# Patient Record
Sex: Female | Born: 1955 | Hispanic: Yes | Smoking: Former smoker
Health system: Southern US, Community
[De-identification: ages and names within clinical notes are randomized; demographics above are authoritative.]

## PROBLEM LIST (undated history)

## (undated) DIAGNOSIS — I208 Other forms of angina pectoris: Secondary | ICD-10-CM

## (undated) DIAGNOSIS — G43909 Migraine, unspecified, not intractable, without status migrainosus: Secondary | ICD-10-CM

## (undated) DIAGNOSIS — M2550 Pain in unspecified joint: Secondary | ICD-10-CM

## (undated) DIAGNOSIS — E785 Hyperlipidemia, unspecified: Secondary | ICD-10-CM

## (undated) DIAGNOSIS — K219 Gastro-esophageal reflux disease without esophagitis: Secondary | ICD-10-CM

## (undated) DIAGNOSIS — M1990 Unspecified osteoarthritis, unspecified site: Secondary | ICD-10-CM

## (undated) DIAGNOSIS — R0602 Shortness of breath: Secondary | ICD-10-CM

## (undated) DIAGNOSIS — I10 Essential (primary) hypertension: Secondary | ICD-10-CM

## (undated) DIAGNOSIS — M549 Dorsalgia, unspecified: Secondary | ICD-10-CM

## (undated) DIAGNOSIS — F32A Depression, unspecified: Secondary | ICD-10-CM

## (undated) DIAGNOSIS — E669 Obesity, unspecified: Secondary | ICD-10-CM

## (undated) DIAGNOSIS — R7303 Prediabetes: Secondary | ICD-10-CM

## (undated) DIAGNOSIS — F329 Major depressive disorder, single episode, unspecified: Secondary | ICD-10-CM

## (undated) DIAGNOSIS — D649 Anemia, unspecified: Secondary | ICD-10-CM

## (undated) DIAGNOSIS — J45909 Unspecified asthma, uncomplicated: Secondary | ICD-10-CM

## (undated) DIAGNOSIS — I2089 Other forms of angina pectoris: Secondary | ICD-10-CM

## (undated) DIAGNOSIS — Z8711 Personal history of peptic ulcer disease: Secondary | ICD-10-CM

## (undated) DIAGNOSIS — R600 Localized edema: Secondary | ICD-10-CM

## (undated) DIAGNOSIS — G4730 Sleep apnea, unspecified: Secondary | ICD-10-CM

## (undated) DIAGNOSIS — K829 Disease of gallbladder, unspecified: Secondary | ICD-10-CM

## (undated) DIAGNOSIS — F419 Anxiety disorder, unspecified: Secondary | ICD-10-CM

## (undated) DIAGNOSIS — R079 Chest pain, unspecified: Secondary | ICD-10-CM

## (undated) DIAGNOSIS — Z91018 Allergy to other foods: Secondary | ICD-10-CM

## (undated) DIAGNOSIS — K5900 Constipation, unspecified: Secondary | ICD-10-CM

## (undated) DIAGNOSIS — H409 Unspecified glaucoma: Secondary | ICD-10-CM

## (undated) DIAGNOSIS — E538 Deficiency of other specified B group vitamins: Secondary | ICD-10-CM

## (undated) DIAGNOSIS — R5383 Other fatigue: Secondary | ICD-10-CM

## (undated) HISTORY — DX: Sleep apnea, unspecified: G47.30

## (undated) HISTORY — DX: Unspecified osteoarthritis, unspecified site: M19.90

## (undated) HISTORY — DX: Shortness of breath: R06.02

## (undated) HISTORY — DX: Anemia, unspecified: D64.9

## (undated) HISTORY — PX: CHOLECYSTECTOMY: SHX55

## (undated) HISTORY — DX: Obesity, unspecified: E66.9

## (undated) HISTORY — DX: Deficiency of other specified B group vitamins: E53.8

## (undated) HISTORY — DX: Chest pain, unspecified: R07.9

## (undated) HISTORY — DX: Constipation, unspecified: K59.00

## (undated) HISTORY — DX: Hyperlipidemia, unspecified: E78.5

## (undated) HISTORY — DX: Disease of gallbladder, unspecified: K82.9

## (undated) HISTORY — DX: Allergy to other foods: Z91.018

## (undated) HISTORY — PX: APPENDECTOMY: SHX54

## (undated) HISTORY — DX: Other fatigue: R53.83

## (undated) HISTORY — PX: ABDOMINAL HYSTERECTOMY: SHX81

## (undated) HISTORY — DX: Dorsalgia, unspecified: M54.9

## (undated) HISTORY — DX: Anxiety disorder, unspecified: F41.9

## (undated) HISTORY — DX: Unspecified glaucoma: H40.9

## (undated) HISTORY — DX: Pain in unspecified joint: M25.50

## (undated) HISTORY — DX: Personal history of peptic ulcer disease: Z87.11

## (undated) HISTORY — DX: Localized edema: R60.0

---

## 1987-07-06 HISTORY — PX: GALLBLADDER SURGERY: SHX652

## 2000-10-15 ENCOUNTER — Encounter: Payer: Self-pay | Admitting: Emergency Medicine

## 2000-10-15 ENCOUNTER — Emergency Department (HOSPITAL_COMMUNITY): Admission: EM | Admit: 2000-10-15 | Discharge: 2000-10-15 | Payer: Self-pay | Admitting: *Deleted

## 2001-03-05 ENCOUNTER — Emergency Department (HOSPITAL_COMMUNITY): Admission: EM | Admit: 2001-03-05 | Discharge: 2001-03-05 | Payer: Self-pay | Admitting: *Deleted

## 2001-09-12 ENCOUNTER — Emergency Department (HOSPITAL_COMMUNITY): Admission: EM | Admit: 2001-09-12 | Discharge: 2001-09-12 | Payer: Self-pay | Admitting: Emergency Medicine

## 2001-09-12 ENCOUNTER — Encounter: Payer: Self-pay | Admitting: Emergency Medicine

## 2001-09-13 ENCOUNTER — Encounter: Payer: Self-pay | Admitting: Emergency Medicine

## 2001-09-13 ENCOUNTER — Ambulatory Visit (HOSPITAL_COMMUNITY): Admission: RE | Admit: 2001-09-13 | Discharge: 2001-09-13 | Payer: Self-pay | Admitting: Emergency Medicine

## 2002-08-28 ENCOUNTER — Emergency Department (HOSPITAL_COMMUNITY): Admission: EM | Admit: 2002-08-28 | Discharge: 2002-08-28 | Payer: Self-pay | Admitting: Emergency Medicine

## 2003-05-14 ENCOUNTER — Encounter: Admission: RE | Admit: 2003-05-14 | Discharge: 2003-05-14 | Payer: Self-pay | Admitting: Internal Medicine

## 2004-10-20 ENCOUNTER — Encounter: Admission: RE | Admit: 2004-10-20 | Discharge: 2005-01-18 | Payer: Self-pay | Admitting: Internal Medicine

## 2004-10-22 ENCOUNTER — Ambulatory Visit (HOSPITAL_COMMUNITY): Admission: RE | Admit: 2004-10-22 | Discharge: 2004-10-22 | Payer: Self-pay | Admitting: Internal Medicine

## 2006-04-07 ENCOUNTER — Emergency Department (HOSPITAL_COMMUNITY): Admission: EM | Admit: 2006-04-07 | Discharge: 2006-04-08 | Payer: Self-pay | Admitting: Emergency Medicine

## 2008-08-08 ENCOUNTER — Emergency Department (HOSPITAL_COMMUNITY): Admission: EM | Admit: 2008-08-08 | Discharge: 2008-08-08 | Payer: Self-pay | Admitting: Emergency Medicine

## 2010-07-26 ENCOUNTER — Encounter: Payer: Self-pay | Admitting: Internal Medicine

## 2010-10-20 LAB — POCT CARDIAC MARKERS
CKMB, poc: 1.4 ng/mL (ref 1.0–8.0)
CKMB, poc: 1.6 ng/mL (ref 1.0–8.0)
Troponin i, poc: <0.05 ng/mL (ref 0.00–0.09)

## 2010-10-20 LAB — COMPREHENSIVE METABOLIC PANEL
ALT: 15 U/L (ref 0–35)
Albumin: 3.9 g/dL (ref 3.5–5.2)
Alkaline Phosphatase: 57 U/L (ref 39–117)
CO2: 28 mEq/L (ref 19–32)
Chloride: 101 mEq/L (ref 96–112)
Creatinine, Ser: 0.82 mg/dL (ref 0.4–1.2)
GFR calc non Af Amer: >60 mL/min (ref >60)
Potassium: 3.3 mEq/L — ABNORMAL LOW (ref 3.5–5.1)
Sodium: 139 mEq/L (ref 135–145)
Total Protein: 7 g/dL (ref 6.0–8.3)

## 2010-10-20 LAB — DIFFERENTIAL
Basophils Absolute: 0 10*3/uL (ref 0.0–0.1)
Eosinophils Relative: 0 % (ref 0–5)
Lymphs Abs: 0.9 10*3/uL (ref 0.7–4.0)
Monocytes Absolute: 0.5 10*3/uL (ref 0.1–1.0)
Neutrophils Relative %: 80 % — ABNORMAL HIGH (ref 43–77)

## 2010-10-20 LAB — CBC
Hemoglobin: 13.8 g/dL (ref 12.0–15.0)
MCHC: 33.4 g/dL (ref 30.0–36.0)
MCV: 86.6 fL (ref 78.0–100.0)
Platelets: 221 10*3/uL (ref 150–400)
RBC: 4.79 MIL/uL (ref 3.87–5.11)

## 2012-07-08 ENCOUNTER — Emergency Department (HOSPITAL_COMMUNITY): Payer: No Typology Code available for payment source

## 2012-07-08 ENCOUNTER — Emergency Department (HOSPITAL_COMMUNITY)
Admission: EM | Admit: 2012-07-08 | Discharge: 2012-07-09 | Disposition: A | Discharge status: Home / Self Care | Payer: No Typology Code available for payment source | Attending: Emergency Medicine | Admitting: Emergency Medicine

## 2012-07-08 ENCOUNTER — Encounter (HOSPITAL_COMMUNITY): Payer: Self-pay

## 2012-07-08 DIAGNOSIS — Y9389 Activity, other specified: Secondary | ICD-10-CM | POA: Insufficient documentation

## 2012-07-08 DIAGNOSIS — Z90710 Acquired absence of both cervix and uterus: Secondary | ICD-10-CM | POA: Insufficient documentation

## 2012-07-08 DIAGNOSIS — Z8739 Personal history of other diseases of the musculoskeletal system and connective tissue: Secondary | ICD-10-CM | POA: Insufficient documentation

## 2012-07-08 DIAGNOSIS — S0993XA Unspecified injury of face, initial encounter: Secondary | ICD-10-CM | POA: Insufficient documentation

## 2012-07-08 DIAGNOSIS — Z79899 Other long term (current) drug therapy: Secondary | ICD-10-CM | POA: Insufficient documentation

## 2012-07-08 DIAGNOSIS — Z9089 Acquired absence of other organs: Secondary | ICD-10-CM | POA: Insufficient documentation

## 2012-07-08 DIAGNOSIS — R0789 Other chest pain: Secondary | ICD-10-CM

## 2012-07-08 DIAGNOSIS — I10 Essential (primary) hypertension: Secondary | ICD-10-CM | POA: Insufficient documentation

## 2012-07-08 DIAGNOSIS — Z9079 Acquired absence of other genital organ(s): Secondary | ICD-10-CM | POA: Insufficient documentation

## 2012-07-08 DIAGNOSIS — Z87891 Personal history of nicotine dependence: Secondary | ICD-10-CM | POA: Insufficient documentation

## 2012-07-08 DIAGNOSIS — Y92410 Unspecified street and highway as the place of occurrence of the external cause: Secondary | ICD-10-CM | POA: Insufficient documentation

## 2012-07-08 DIAGNOSIS — Z8679 Personal history of other diseases of the circulatory system: Secondary | ICD-10-CM | POA: Insufficient documentation

## 2012-07-08 DIAGNOSIS — S199XXA Unspecified injury of neck, initial encounter: Secondary | ICD-10-CM | POA: Insufficient documentation

## 2012-07-08 DIAGNOSIS — S298XXA Other specified injuries of thorax, initial encounter: Secondary | ICD-10-CM | POA: Insufficient documentation

## 2012-07-08 DIAGNOSIS — M7918 Myalgia, other site: Secondary | ICD-10-CM

## 2012-07-08 DIAGNOSIS — K219 Gastro-esophageal reflux disease without esophagitis: Secondary | ICD-10-CM | POA: Insufficient documentation

## 2012-07-08 DIAGNOSIS — J45909 Unspecified asthma, uncomplicated: Secondary | ICD-10-CM | POA: Insufficient documentation

## 2012-07-08 HISTORY — DX: Gastro-esophageal reflux disease without esophagitis: K21.9

## 2012-07-08 HISTORY — DX: Unspecified asthma, uncomplicated: J45.909

## 2012-07-08 HISTORY — DX: Unspecified osteoarthritis, unspecified site: M19.90

## 2012-07-08 HISTORY — DX: Other forms of angina pectoris: I20.8

## 2012-07-08 HISTORY — DX: Essential (primary) hypertension: I10

## 2012-07-08 HISTORY — DX: Other forms of angina pectoris: I20.89

## 2012-07-08 LAB — BASIC METABOLIC PANEL
CO2: 29 mEq/L (ref 19–32)
Creatinine, Ser: 0.65 mg/dL (ref 0.50–1.10)
Potassium: 3.5 mEq/L (ref 3.5–5.1)

## 2012-07-08 LAB — CBC
MCH: 27.7 pg (ref 26.0–34.0)
Platelets: 325 10*3/uL (ref 150–400)
RBC: 4.73 MIL/uL (ref 3.87–5.11)
RDW: 13.9 % (ref 11.5–15.5)
WBC: 7.2 10*3/uL (ref 4.0–10.5)

## 2012-07-08 MED ORDER — TRAMADOL HCL 50 MG PO TABS
100.0000 mg | ORAL_TABLET | Freq: Once | ORAL | Status: AC
  Administered 2012-07-08: 100 mg via ORAL
  Filled 2012-07-08: qty 2

## 2012-07-08 MED ORDER — IBUPROFEN 800 MG PO TABS
800.0000 mg | ORAL_TABLET | Freq: Once | ORAL | Status: AC
  Administered 2012-07-08: 800 mg via ORAL
  Filled 2012-07-08: qty 1

## 2012-07-08 MED ORDER — CYCLOBENZAPRINE HCL 10 MG PO TABS: 10.0000 mg | ORAL_TABLET | Freq: Three times a day (TID) | ORAL | Status: DC | PRN

## 2012-07-08 MED ORDER — TRAMADOL HCL 50 MG PO TABS: 100.0000 mg | ORAL_TABLET | Freq: Four times a day (QID) | ORAL | Status: DC | PRN

## 2012-07-08 MED ORDER — CYCLOBENZAPRINE HCL 10 MG PO TABS
10.0000 mg | ORAL_TABLET | Freq: Once | ORAL | Status: AC
  Administered 2012-07-08: 10 mg via ORAL
  Filled 2012-07-08: qty 1
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
## 2012-08-22 ENCOUNTER — Other Ambulatory Visit: Payer: Self-pay | Admitting: Internal Medicine

## 2012-08-22 DIAGNOSIS — Z1231 Encounter for screening mammogram for malignant neoplasm of breast: Secondary | ICD-10-CM

## 2012-08-24 ENCOUNTER — Ambulatory Visit
Admission: RE | Admit: 2012-08-24 | Discharge: 2012-08-24 | Disposition: A | Discharge status: Home / Self Care | Payer: 59 | Source: Ambulatory Visit | Attending: Internal Medicine | Admitting: Internal Medicine

## 2012-08-24 DIAGNOSIS — Z1231 Encounter for screening mammogram for malignant neoplasm of breast: Secondary | ICD-10-CM

## 2012-08-30 ENCOUNTER — Other Ambulatory Visit (HOSPITAL_COMMUNITY): Payer: Self-pay | Admitting: Chiropractic Medicine

## 2012-08-30 DIAGNOSIS — G8911 Acute pain due to trauma: Secondary | ICD-10-CM

## 2012-08-30 DIAGNOSIS — M549 Dorsalgia, unspecified: Secondary | ICD-10-CM

## 2012-08-30 DIAGNOSIS — M545 Low back pain: Secondary | ICD-10-CM

## 2012-09-05 ENCOUNTER — Ambulatory Visit (HOSPITAL_COMMUNITY)
Admission: RE | Admit: 2012-09-05 | Discharge: 2012-09-05 | Disposition: A | Discharge status: Home / Self Care | Payer: No Typology Code available for payment source | Source: Ambulatory Visit | Attending: Chiropractic Medicine | Admitting: Chiropractic Medicine

## 2012-09-05 DIAGNOSIS — M545 Low back pain, unspecified: Secondary | ICD-10-CM | POA: Insufficient documentation

## 2012-09-05 DIAGNOSIS — D1809 Hemangioma of other sites: Secondary | ICD-10-CM | POA: Insufficient documentation

## 2012-09-05 DIAGNOSIS — M5126 Other intervertebral disc displacement, lumbar region: Secondary | ICD-10-CM | POA: Insufficient documentation

## 2012-09-05 DIAGNOSIS — Q762 Congenital spondylolisthesis: Secondary | ICD-10-CM | POA: Insufficient documentation

## 2012-09-05 DIAGNOSIS — M129 Arthropathy, unspecified: Secondary | ICD-10-CM | POA: Insufficient documentation

## 2013-09-24 ENCOUNTER — Emergency Department (HOSPITAL_COMMUNITY)
Admission: EM | Admit: 2013-09-24 | Discharge: 2013-09-24 | Disposition: A | Discharge status: Home / Self Care | Payer: Medicaid Other | Attending: Emergency Medicine | Admitting: Emergency Medicine

## 2013-09-24 ENCOUNTER — Emergency Department (HOSPITAL_COMMUNITY): Payer: Medicaid Other

## 2013-09-24 ENCOUNTER — Encounter (HOSPITAL_COMMUNITY): Payer: Self-pay | Admitting: Emergency Medicine

## 2013-09-24 DIAGNOSIS — I209 Angina pectoris, unspecified: Secondary | ICD-10-CM | POA: Insufficient documentation

## 2013-09-24 DIAGNOSIS — M129 Arthropathy, unspecified: Secondary | ICD-10-CM | POA: Insufficient documentation

## 2013-09-24 DIAGNOSIS — X12XXXA Contact with other hot fluids, initial encounter: Secondary | ICD-10-CM | POA: Insufficient documentation

## 2013-09-24 DIAGNOSIS — K219 Gastro-esophageal reflux disease without esophagitis: Secondary | ICD-10-CM | POA: Insufficient documentation

## 2013-09-24 DIAGNOSIS — Y929 Unspecified place or not applicable: Secondary | ICD-10-CM | POA: Insufficient documentation

## 2013-09-24 DIAGNOSIS — T23109A Burn of first degree of unspecified hand, unspecified site, initial encounter: Secondary | ICD-10-CM | POA: Insufficient documentation

## 2013-09-24 DIAGNOSIS — Z791 Long term (current) use of non-steroidal anti-inflammatories (NSAID): Secondary | ICD-10-CM | POA: Insufficient documentation

## 2013-09-24 DIAGNOSIS — X131XXA Other contact with steam and other hot vapors, initial encounter: Secondary | ICD-10-CM

## 2013-09-24 DIAGNOSIS — T2010XA Burn of first degree of head, face, and neck, unspecified site, initial encounter: Secondary | ICD-10-CM | POA: Insufficient documentation

## 2013-09-24 DIAGNOSIS — J45909 Unspecified asthma, uncomplicated: Secondary | ICD-10-CM | POA: Insufficient documentation

## 2013-09-24 DIAGNOSIS — Z87891 Personal history of nicotine dependence: Secondary | ICD-10-CM | POA: Insufficient documentation

## 2013-09-24 DIAGNOSIS — T2000XA Burn of unspecified degree of head, face, and neck, unspecified site, initial encounter: Secondary | ICD-10-CM

## 2013-09-24 DIAGNOSIS — R059 Cough, unspecified: Secondary | ICD-10-CM | POA: Insufficient documentation

## 2013-09-24 DIAGNOSIS — R05 Cough: Secondary | ICD-10-CM | POA: Insufficient documentation

## 2013-09-24 DIAGNOSIS — Z79899 Other long term (current) drug therapy: Secondary | ICD-10-CM | POA: Insufficient documentation

## 2013-09-24 DIAGNOSIS — I10 Essential (primary) hypertension: Secondary | ICD-10-CM | POA: Insufficient documentation

## 2013-09-24 DIAGNOSIS — Y93G9 Activity, other involving cooking and grilling: Secondary | ICD-10-CM | POA: Insufficient documentation
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
## 2013-09-24 NOTE — ED Notes (Signed)
Tamala Julian, NP at bedside.

## 2013-09-24 NOTE — ED Notes (Signed)
Skin pink in color ,no blisters opserved on assessment.
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
## 2013-09-25 NOTE — ED Provider Notes (Signed)
Medical screening examination/treatment/procedure(s) were performed by non-physician practitioner and as supervising physician I was immediately available for consultation/collaboration.   EKG Interpretation None       Ezequiel Essex, MD 09/25/13 1014

## 2014-12-25 ENCOUNTER — Emergency Department (HOSPITAL_COMMUNITY)
Admission: EM | Admit: 2014-12-25 | Discharge: 2014-12-25 | Disposition: A | Discharge status: Nursing Home (Includes ICF) | Payer: Medicaid Other | Source: Home / Self Care | Attending: Family Medicine | Admitting: Family Medicine

## 2014-12-25 ENCOUNTER — Emergency Department (HOSPITAL_COMMUNITY): Payer: Medicaid Other

## 2014-12-25 ENCOUNTER — Emergency Department (HOSPITAL_BASED_OUTPATIENT_CLINIC_OR_DEPARTMENT_OTHER): Payer: Medicaid Other

## 2014-12-25 ENCOUNTER — Encounter (HOSPITAL_COMMUNITY): Payer: Self-pay | Admitting: Family Medicine

## 2014-12-25 ENCOUNTER — Emergency Department (HOSPITAL_COMMUNITY)
Admission: EM | Admit: 2014-12-25 | Discharge: 2014-12-25 | Disposition: A | Discharge status: Home / Self Care | Payer: Medicaid Other | Attending: Emergency Medicine | Admitting: Emergency Medicine

## 2014-12-25 ENCOUNTER — Encounter (HOSPITAL_COMMUNITY): Payer: Self-pay | Admitting: Emergency Medicine

## 2014-12-25 ENCOUNTER — Emergency Department (INDEPENDENT_AMBULATORY_CARE_PROVIDER_SITE_OTHER): Payer: Medicaid Other

## 2014-12-25 DIAGNOSIS — I209 Angina pectoris, unspecified: Secondary | ICD-10-CM | POA: Insufficient documentation

## 2014-12-25 DIAGNOSIS — J45901 Unspecified asthma with (acute) exacerbation: Secondary | ICD-10-CM | POA: Insufficient documentation

## 2014-12-25 DIAGNOSIS — Z79899 Other long term (current) drug therapy: Secondary | ICD-10-CM | POA: Insufficient documentation

## 2014-12-25 DIAGNOSIS — Z791 Long term (current) use of non-steroidal anti-inflammatories (NSAID): Secondary | ICD-10-CM | POA: Diagnosis not present

## 2014-12-25 DIAGNOSIS — K219 Gastro-esophageal reflux disease without esophagitis: Secondary | ICD-10-CM | POA: Diagnosis not present

## 2014-12-25 DIAGNOSIS — R0602 Shortness of breath: Secondary | ICD-10-CM

## 2014-12-25 DIAGNOSIS — I10 Essential (primary) hypertension: Secondary | ICD-10-CM | POA: Insufficient documentation

## 2014-12-25 DIAGNOSIS — M7989 Other specified soft tissue disorders: Secondary | ICD-10-CM | POA: Diagnosis not present

## 2014-12-25 DIAGNOSIS — R609 Edema, unspecified: Secondary | ICD-10-CM | POA: Diagnosis not present

## 2014-12-25 DIAGNOSIS — M1990 Unspecified osteoarthritis, unspecified site: Secondary | ICD-10-CM | POA: Diagnosis not present

## 2014-12-25 DIAGNOSIS — Z87891 Personal history of nicotine dependence: Secondary | ICD-10-CM | POA: Insufficient documentation

## 2014-12-25 DIAGNOSIS — R110 Nausea: Secondary | ICD-10-CM | POA: Insufficient documentation

## 2014-12-25 LAB — I-STAT TROPONIN, ED
TROPONIN I, POC: 0.01 ng/mL (ref 0.00–0.08)
Troponin i, poc: 0.02 ng/mL (ref 0.00–0.08)

## 2014-12-25 LAB — BASIC METABOLIC PANEL WITH GFR
Anion gap: 12 (ref 5–15)
BUN: 7 mg/dL (ref 6–20)
CO2: 26 mmol/L (ref 22–32)
Calcium: 9.8 mg/dL (ref 8.9–10.3)
Chloride: 103 mmol/L (ref 101–111)
Creatinine, Ser: 0.81 mg/dL (ref 0.44–1.00)
GFR calc Af Amer: >60 mL/min
GFR calc non Af Amer: >60 mL/min
Glucose, Bld: 93 mg/dL (ref 65–99)
Potassium: 3.7 mmol/L (ref 3.5–5.1)
Sodium: 141 mmol/L (ref 135–145)

## 2014-12-25 LAB — CBC
HCT: 43.2 % (ref 36.0–46.0)
HEMOGLOBIN: 13.8 g/dL (ref 12.0–15.0)
MCH: 26.6 pg (ref 26.0–34.0)
MCHC: 31.9 g/dL (ref 30.0–36.0)
MCV: 83.4 fL (ref 78.0–100.0)
Platelets: 306 10*3/uL (ref 150–400)
RBC: 5.18 MIL/uL — ABNORMAL HIGH (ref 3.87–5.11)
RDW: 14 % (ref 11.5–15.5)
WBC: 6.4 10*3/uL (ref 4.0–10.5)

## 2014-12-25 LAB — BRAIN NATRIURETIC PEPTIDE: B Natriuretic Peptide: 16.8 pg/mL (ref 0.0–100.0)

## 2014-12-25 LAB — D-DIMER, QUANTITATIVE: D-Dimer, Quant: 1.09 ug/mL-FEU — ABNORMAL HIGH (ref 0.00–0.48)

## 2014-12-25 MED ORDER — IPRATROPIUM-ALBUTEROL 0.5-2.5 (3) MG/3ML IN SOLN
3.0000 mL | Freq: Once | RESPIRATORY_TRACT | Status: AC
  Administered 2014-12-25: 3 mL via RESPIRATORY_TRACT

## 2014-12-25 MED ORDER — ASPIRIN 81 MG PO CHEW
324.0000 mg | CHEWABLE_TABLET | Freq: Once | ORAL | Status: AC
  Administered 2014-12-25: 324 mg via ORAL
  Filled 2014-12-25: qty 4

## 2014-12-25 MED ORDER — IPRATROPIUM-ALBUTEROL 0.5-2.5 (3) MG/3ML IN SOLN
3.0000 mL | Freq: Once | RESPIRATORY_TRACT | Status: AC
  Administered 2014-12-25: 3 mL via RESPIRATORY_TRACT
  Filled 2014-12-25: qty 3

## 2014-12-25 MED ORDER — SODIUM CHLORIDE 0.9 % IV SOLN: INTRAVENOUS | Status: DC

## 2014-12-25 MED ORDER — SODIUM CHLORIDE 0.9 % IV BOLUS (SEPSIS)
250.0000 mL | Freq: Once | INTRAVENOUS | Status: AC
  Administered 2014-12-25: 250 mL via INTRAVENOUS

## 2014-12-25 MED ORDER — IPRATROPIUM-ALBUTEROL 0.5-2.5 (3) MG/3ML IN SOLN
RESPIRATORY_TRACT | Status: AC
  Filled 2014-12-25: qty 3

## 2014-12-25 MED ORDER — TECHNETIUM TO 99M ALBUMIN AGGREGATED
6.0000 | Freq: Once | INTRAVENOUS | Status: AC | PRN
  Administered 2014-12-25: 6 via INTRAVENOUS

## 2014-12-25 MED ORDER — TECHNETIUM TC 99M DIETHYLENETRIAME-PENTAACETIC ACID: 40.0000 | Freq: Once | INTRAVENOUS | Status: DC | PRN

## 2014-12-25 MED ORDER — ONDANSETRON HCL 4 MG/2ML IJ SOLN
4.0000 mg | Freq: Once | INTRAMUSCULAR | Status: AC
  Administered 2014-12-25: 4 mg via INTRAVENOUS
  Filled 2014-12-25: qty 2

## 2014-12-25 MED ORDER — ALBUTEROL SULFATE HFA 108 (90 BASE) MCG/ACT IN AERS
2.0000 | INHALATION_SPRAY | Freq: Four times a day (QID) | RESPIRATORY_TRACT | Status: DC
  Administered 2014-12-25: 2 via RESPIRATORY_TRACT
  Filled 2014-12-25: qty 6.7

## 2014-12-25 NOTE — ED Notes (Signed)
Pt. Independently ambulatory to restroom. No distress, no SOB noted by pt.

## 2014-12-25 NOTE — Discharge Instructions (Signed)
Use the albuterol inhaler 2 puffs every 6 hours for next 7 days and then as needed. Follow up with your doctor. Work not provided.
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
## 2014-12-25 NOTE — ED Provider Notes (Signed)
Kristi [name] is a 59 y.o. female who presents to Urgent Care today for chest pain and shortness of breath with exertion. Patient notes bilateral lateral chest pain radiating to the central portion of her chest. The pain is moderate and worse with deep inspiration. She denies any exertional chest pain component. She does note worsening shortness of breath with exertion. This morning in the shower she felt somewhat dizzy and lightheaded but denies any palpitations. She notes mild leg swelling but denies one leg being bigger than the other or history of immobility. She does sleep propped up 25 at night however this is not new. She states that she sleeps propped up because her neck hurts her. She does have some wheezing as well. She is a former smoker.   Past Medical History  Diagnosis Date  . Hypertension   . Asthma   . Angina at rest   . Arthritis   . GERD (gastroesophageal reflux disease)    Past Surgical History  Procedure Laterality Date  . Cholecystectomy    . Appendectomy    . Abdominal hysterectomy     History  Substance Use Topics  . Smoking status: Former Research scientist (life sciences)  . Smokeless tobacco: Not on file  . Alcohol Use: No   ROS as above Medications: No current facility-administered medications for this encounter.   Current Outpatient Prescriptions  Medication Sig Dispense Refill  . carvedilol (COREG) 12.5 MG tablet Take 12.5 mg by mouth 2 (two) times daily with a meal.    . cyclobenzaprine (FLEXERIL) 10 MG tablet Take 10 mg by mouth 3 (three) times daily as needed for muscle spasms.     . diclofenac (VOLTAREN) 75 MG EC tablet Take 75 mg by mouth 2 (two) times daily.    Marland Kitchen losartan (COZAAR) 50 MG tablet Take 50 mg by mouth daily.    Marland Kitchen omeprazole (PRILOSEC) 20 MG capsule Take 20 mg by mouth daily.    . potassium chloride (KLOR-CON) 20 MEQ packet Take 20 mEq by mouth 2 (two) times daily.    Marland Kitchen acetaminophen (TYLENOL) 325 MG tablet Take 650 mg by mouth every 6 (six) hours as needed.     . ALPRAZolam (XANAX) 0.25 MG tablet Take 0.25 mg by mouth daily.    . montelukast (SINGULAIR) 10 MG tablet Take 10 mg by mouth at bedtime.    Marland Kitchen olmesartan (BENICAR) 40 MG tablet Take 40 mg by mouth daily.     Allergies  Allergen Reactions  . Iodine Anaphylaxis  . Sulfur Hives     Exam:  BP 146/95 mmHg  Pulse 94  Temp(Src) 98.3 F (36.8 C) (Oral)  Resp 20  SpO2 96% Gen: Well NAD obese HEENT: EOMI,  MMM no JVD Lungs: Normal work of breathing. Coarse breath sounds bilaterally. Heart: RRR no MRG Abd: NABS, Soft. Nondistended, Nontender Exts: Brisk capillary refill, warm and well perfused. No edema   ED ECG REPORT   Date: 12/25/2014  Rate: 90  Rhythm: normal sinus rhythm  QRS Axis: normal  Intervals: normal  ST/T Wave abnormalities: normal  Conduction Disutrbances:none  Narrative Interpretation:   Old EKG Reviewed: changes noted and Improved rate  I have personally reviewed the EKG tracing and agree with the computerized printout as noted.   Patient was given a 2.5/0.5 mg DuoNeb nebulizer treatment, and did not feel any better  No results found for this or any previous visit (from the past 24 hour(s)). Dg Chest 2 View  12/25/2014   CLINICAL DATA:  Chest pain,  shortness of breath. History of asthma, bronchitis, pneumonia.  EXAM: CHEST  2 VIEW  COMPARISON:  09/24/2013  FINDINGS: Heart is borderline in size. No confluent airspace opacities or effusions. No acute bony abnormality. Degenerative changes in the shoulders and thoracic spine.  IMPRESSION: No active cardiopulmonary disease.   Electronically Signed   By: Rolm Baptise M.D.   On: 12/25/2014 14:13    Assessment and Plan: 59 y.o. female with chest pain and shortness of breath with exertion. No improvement with nebulizer treatment. EKG not significantly abnormal. At this time the etiology of her complaint is unclear. Specifically she has some heart failure symptoms. Transfer to ED for further evaluation and  management.  Discussed warning signs or symptoms. Please see discharge instructions. Patient expresses understanding.     Gregor Hams, MD 12/25/14 [id number]

## 2014-12-25 NOTE — ED Notes (Signed)
Pt has been suffering from SOB, lower and mid upper chest pain, dizziness, headache, and numbness and tingling in BUE since yesterday.  Pt states she has been out of one of her BP medications for about a week, but has been taking her Coreg and Losartan.  Pt states she cannot walk very far without being extremely short of breath and dizzy.

## 2014-12-25 NOTE — Progress Notes (Signed)
VASCULAR LAB PRELIMINARY  PRELIMINARY  PRELIMINARY  PRELIMINARY  Bilateral lower extremity venous duplex  completed.    Preliminary report:  Bilateral:  No evidence of DVT, superficial thrombosis, or Baker's Cyst.   Clydene Burack, RVT 12/25/2014, 7:10 PM

## 2014-12-25 NOTE — ED Provider Notes (Signed)
CSN: [id number]     Arrival date & time 12/25/14  1449 History   First MD Initiated Contact with Patient 12/25/14 1506     Chief Complaint  Patient presents with  . Shortness of Breath  . Chest Pain     (Consider location/radiation/quality/duration/timing/severity/associated sxs/prior Treatment) Patient is a 59 y.o. female presenting with shortness of breath and chest pain. The history is provided by the patient.  Shortness of Breath Associated symptoms: chest pain, fever and wheezing   Associated symptoms: no abdominal pain, no headaches, no rash and no vomiting   Chest Pain Associated symptoms: fatigue, fever, nausea and shortness of breath   Associated symptoms: no abdominal pain, no back pain, no headache and not vomiting    patient sent from urgent care for concerns for shortness of breath and chest pain. Patient had a chest x-ray and EKG done at urgent care which was normal. Patient states that symptoms started yesterday at about 10 in the morning shortness of breath and chest pain exertional shortness of breath. Chest pains 8 out of 10 now bilateral chest, along the rib cage area. Associated with fevers nausea no vomiting no diarrhea also is associated with some leg swelling. Patient has a history of hypertension and asthma. Patient has felt as if she has been wheezing some. Patient used to have albuterol in the past but is currently out.  Past Medical History  Diagnosis Date  . Hypertension   . Asthma   . Angina at rest   . Arthritis   . GERD (gastroesophageal reflux disease)    Past Surgical History  Procedure Laterality Date  . Cholecystectomy    . Appendectomy    . Abdominal hysterectomy     History reviewed. No pertinent family history. History  Substance Use Topics  . Smoking status: Former Research scientist (life sciences)  . Smokeless tobacco: Not on file  . Alcohol Use: No   OB History    No data available     Review of Systems  Constitutional: Positive for fever and fatigue.   HENT: Negative for congestion.   Eyes: Negative for visual disturbance.  Respiratory: Positive for shortness of breath and wheezing.   Cardiovascular: Positive for chest pain and leg swelling.  Gastrointestinal: Positive for nausea. Negative for vomiting and abdominal pain.  Genitourinary: Negative for dysuria.  Musculoskeletal: Negative for back pain.  Skin: Negative for rash.  Neurological: Negative for headaches.  Hematological: Does not bruise/bleed easily.  Psychiatric/Behavioral: Negative for confusion.      Allergies  Iodine and Sulfur  Home Medications   Prior to Admission medications   Medication Sig Start Date End Date Taking? Authorizing Provider  acetaminophen (TYLENOL) 325 MG tablet Take 650 mg by mouth every 6 (six) hours as needed for mild pain.    Yes Historical Provider, MD  ALPRAZolam Duanne Moron) 0.25 MG tablet Take 0.25 mg by mouth at bedtime as needed.    Yes Historical Provider, MD  carvedilol (COREG) 12.5 MG tablet Take 12.5 mg by mouth 2 (two) times daily with a meal.   Yes Historical Provider, MD  cyclobenzaprine (FLEXERIL) 10 MG tablet Take 10 mg by mouth 2 (two) times daily as needed for muscle spasms.    Yes Historical Provider, MD  diclofenac (VOLTAREN) 75 MG EC tablet Take 75 mg by mouth 2 (two) times daily.   Yes Historical Provider, MD  losartan (COZAAR) 50 MG tablet Take 50 mg by mouth daily.   Yes Historical Provider, MD  montelukast (SINGULAIR) 10 MG tablet  Take 10 mg by mouth at bedtime.   Yes Historical Provider, MD  naproxen sodium (ANAPROX) 220 MG tablet Take 220 mg by mouth 2 (two) times daily as needed.   Yes Historical Provider, MD  omeprazole (PRILOSEC) 20 MG capsule Take 20 mg by mouth daily.   Yes Historical Provider, MD  potassium chloride (KLOR-CON) 20 MEQ packet Take 20 mEq by mouth 2 (two) times daily.   Yes Historical Provider, MD   BP 112/59 mmHg  Pulse 93  Resp 22  SpO2 97% Physical Exam  Constitutional: She is oriented to person,  place, and time. She appears well-developed and well-nourished. No distress.  HENT:  Head: Normocephalic and atraumatic.  Mouth/Throat: Oropharynx is clear and moist.  Eyes: Conjunctivae and EOM are normal. Pupils are equal, round, and reactive to light.  Neck: Normal range of motion.  Cardiovascular: Normal rate, regular rhythm and normal heart sounds.   No murmur heard. Pulmonary/Chest: Effort normal and breath sounds normal. No respiratory distress. She has no wheezes.  Abdominal: Soft. Bowel sounds are normal. There is no tenderness.  Musculoskeletal: Normal range of motion. She exhibits edema.  Neurological: She is alert and oriented to person, place, and time. No cranial nerve deficit. She exhibits normal muscle tone. Coordination normal.  Skin: Skin is warm. No rash noted.  Nursing note and vitals reviewed.   ED Course  Procedures (including critical care time) Labs Review Labs Reviewed  CBC - Abnormal; Notable for the following:    RBC 5.18 (*)    All other components within normal limits  D-DIMER, QUANTITATIVE (NOT AT [facility]) - Abnormal; Notable for the following:    D-Dimer, Quant 1.09 (*)    All other components within normal limits  BASIC METABOLIC PANEL  BRAIN NATRIURETIC PEPTIDE  I-STAT TROPOININ, ED  Randolm Idol, ED    Imaging Review Dg Chest 2 View  12/25/2014   CLINICAL DATA:  Chest pain, shortness of breath. History of asthma, bronchitis, pneumonia.  EXAM: CHEST  2 VIEW  COMPARISON:  09/24/2013  FINDINGS: Heart is borderline in size. No confluent airspace opacities or effusions. No acute bony abnormality. Degenerative changes in the shoulders and thoracic spine.  IMPRESSION: No active cardiopulmonary disease.   Electronically Signed   By: Rolm Baptise M.D.   On: 12/25/2014 14:13   Ct Chest Wo Contrast  12/25/2014   CLINICAL DATA:  Chest pressure and shortness of breath  EXAM: CT CHEST WITHOUT CONTRAST  TECHNIQUE: Multidetector CT imaging of the chest was  performed following the standard protocol without IV contrast. Patient has allergy to iodinated contrast material.  COMPARISON:  Chest radiograph December 25, 2014  FINDINGS: There is mild atelectatic change in the left base. There is no lung edema or consolidation. No pneumothorax.  There is no demonstrable thoracic adenopathy. There is atherosclerotic change in the aortic arch region. There is no demonstrable thoracic aortic aneurysm. The pericardium is not thickened. Visualized thyroid appears normal.  Visualized upper abdomen, there are surgical clips in the gallbladder fossa region.  There are no blastic or lytic bone lesions. There is degenerative change in the thoracic spine.  There is a subtle lucency in the manubrium which appears to represent a fracture. This subtle fracture is best seen on sagittal slice 63 series 502.  IMPRESSION: Apparent subtle nondisplaced fracture of the manubrium. Mild atelectatic change in the left base. No edema or consolidation. No adenopathy. Gallbladder absent.   Electronically Signed   By: Lowella Grip III M.D.  On: 12/25/2014 16:41   Nm Pulmonary Perfusion  12/25/2014   CLINICAL DATA:  Chest pain and shortness of breath  EXAM: NUCLEAR MEDICINE VENTILATION AND PERFUSION SCAN  Views: Anterior, posterior, LPO, LAO, RPO, RAO -ventilation and perfusion  Radionuclide: Technetium 57m DTPA -ventilation; Technetium 76m macroaggregated albumin-perfusion  Dose:  40.0 mCi-ventilation; 6.0 mCi-perfusion  Route of administration: Inhalation -ventilation ; intravenous -perfusion  COMPARISON:  Chest radiograph and chest CT December 25, 2014  FINDINGS: On the ventilation study, radiotracer uptake is homogeneous and symmetric bilaterally. There are no appreciable ventilation defects.  On the perfusion study, radiotracer uptake is homogeneous and symmetric bilaterally. There are no appreciable perfusion defects.  IMPRESSION: Normal ventilation and perfusion lung scans. Very low probability of  pulmonary embolus.   Electronically Signed   By: Lowella Grip III M.D.   On: 12/25/2014 21:42     EKG Interpretation None      MDM   Final diagnoses:  SOB (shortness of breath)  Reactive airway disease, unspecified asthma severity, with acute exacerbation    Patient with extensive workup for the chest pain and shortness of breath. Troponins were negative 2 record chest x-ray normal. CT chest without contrast because patient has an allergy did show a subtle non-displaced fracture of the manubrium which could perhaps represent the patient's symptoms however patient has been admitting to feeling like she's been wheezing on and off and has not been using albuterol inhaler. Given DuoNeb here. Had bilateral leg swelling d-dimer was elevated had ultrasounds of both legs and VQ scan all of which were negative for deep vein thrombosis or any evidence of pulmonary embolus. Patient had troponins 2 that were negative as stated above. This does not seem to be cardiac certainly not a pulmonary embolus no evidence of a pneumonia. Could be related to the subtle fracture on the manubrium. Also suspect though it's more likely that his reactive airway disease related to her asthma. Will treat with albuterol inhaler and have her follow-up with her doctor. Work note provided.    Fredia Sorrow, MD 12/25/14 2214

## 2016-05-10 ENCOUNTER — Emergency Department (HOSPITAL_COMMUNITY): Payer: Self-pay

## 2016-05-10 ENCOUNTER — Observation Stay (HOSPITAL_COMMUNITY)
Admission: EM | Admit: 2016-05-10 | Discharge: 2016-05-11 | Disposition: A | Discharge status: Home / Self Care | Payer: Self-pay | Attending: Internal Medicine | Admitting: Internal Medicine

## 2016-05-10 ENCOUNTER — Encounter (HOSPITAL_COMMUNITY): Payer: Self-pay | Admitting: Emergency Medicine

## 2016-05-10 DIAGNOSIS — Z9181 History of falling: Secondary | ICD-10-CM | POA: Insufficient documentation

## 2016-05-10 DIAGNOSIS — Z87891 Personal history of nicotine dependence: Secondary | ICD-10-CM | POA: Insufficient documentation

## 2016-05-10 DIAGNOSIS — E669 Obesity, unspecified: Secondary | ICD-10-CM | POA: Insufficient documentation

## 2016-05-10 DIAGNOSIS — K2100 Gastro-esophageal reflux disease with esophagitis, without bleeding: Secondary | ICD-10-CM | POA: Insufficient documentation

## 2016-05-10 DIAGNOSIS — R0789 Other chest pain: Secondary | ICD-10-CM

## 2016-05-10 DIAGNOSIS — Z791 Long term (current) use of non-steroidal anti-inflammatories (NSAID): Secondary | ICD-10-CM | POA: Insufficient documentation

## 2016-05-10 DIAGNOSIS — I161 Hypertensive emergency: Secondary | ICD-10-CM

## 2016-05-10 DIAGNOSIS — M25561 Pain in right knee: Secondary | ICD-10-CM | POA: Insufficient documentation

## 2016-05-10 DIAGNOSIS — R079 Chest pain, unspecified: Principal | ICD-10-CM | POA: Insufficient documentation

## 2016-05-10 DIAGNOSIS — I119 Hypertensive heart disease without heart failure: Secondary | ICD-10-CM | POA: Insufficient documentation

## 2016-05-10 DIAGNOSIS — M25562 Pain in left knee: Secondary | ICD-10-CM | POA: Insufficient documentation

## 2016-05-10 DIAGNOSIS — I10 Essential (primary) hypertension: Secondary | ICD-10-CM

## 2016-05-10 DIAGNOSIS — Z8249 Family history of ischemic heart disease and other diseases of the circulatory system: Secondary | ICD-10-CM | POA: Insufficient documentation

## 2016-05-10 DIAGNOSIS — Z6841 Body Mass Index (BMI) 40.0 and over, adult: Secondary | ICD-10-CM | POA: Insufficient documentation

## 2016-05-10 DIAGNOSIS — F419 Anxiety disorder, unspecified: Secondary | ICD-10-CM

## 2016-05-10 DIAGNOSIS — K219 Gastro-esophageal reflux disease without esophagitis: Secondary | ICD-10-CM | POA: Insufficient documentation

## 2016-05-10 DIAGNOSIS — M1990 Unspecified osteoarthritis, unspecified site: Secondary | ICD-10-CM | POA: Insufficient documentation

## 2016-05-10 LAB — CBC WITH DIFFERENTIAL/PLATELET
Basophils Absolute: 0 10*3/uL (ref 0.0–0.1)
Basophils Relative: 0 %
EOS PCT: 1 %
Eosinophils Absolute: 0.1 10*3/uL (ref 0.0–0.7)
HCT: 41.4 % (ref 36.0–46.0)
Hemoglobin: 13 g/dL (ref 12.0–15.0)
LYMPHS ABS: 2 10*3/uL (ref 0.7–4.0)
LYMPHS PCT: 34 %
MCH: 27.1 pg (ref 26.0–34.0)
MCHC: 31.4 g/dL (ref 30.0–36.0)
MCV: 86.4 fL (ref 78.0–100.0)
MONO ABS: 0.5 10*3/uL (ref 0.1–1.0)
Monocytes Relative: 9 %
Neutro Abs: 3.4 10*3/uL (ref 1.7–7.7)
Neutrophils Relative %: 56 %
PLATELETS: 258 10*3/uL (ref 150–400)
RBC: 4.79 MIL/uL (ref 3.87–5.11)
RDW: 14 % (ref 11.5–15.5)
WBC: 6 10*3/uL (ref 4.0–10.5)

## 2016-05-10 LAB — TROPONIN I
Troponin I: <0.03 ng/mL (ref <0.03)
Troponin I: <0.03 ng/mL (ref <0.03)

## 2016-05-10 LAB — COMPREHENSIVE METABOLIC PANEL
ALBUMIN: 3.7 g/dL (ref 3.5–5.0)
ALT: 12 U/L — AB (ref 14–54)
AST: 20 U/L (ref 15–41)
Alkaline Phosphatase: 51 U/L (ref 38–126)
Anion gap: 7 (ref 5–15)
BILIRUBIN TOTAL: 0.3 mg/dL (ref 0.3–1.2)
BUN: 12 mg/dL (ref 6–20)
CHLORIDE: 107 mmol/L (ref 101–111)
CO2: 30 mmol/L (ref 22–32)
CREATININE: 0.85 mg/dL (ref 0.44–1.00)
Calcium: 9.3 mg/dL (ref 8.9–10.3)
GFR calc Af Amer: >60 mL/min (ref >60)
GFR calc non Af Amer: >60 mL/min (ref >60)
GLUCOSE: 89 mg/dL (ref 65–99)
POTASSIUM: 4.2 mmol/L (ref 3.5–5.1)
Sodium: 144 mmol/L (ref 135–145)
TOTAL PROTEIN: 6.7 g/dL (ref 6.5–8.1)

## 2016-05-10 LAB — BRAIN NATRIURETIC PEPTIDE: B Natriuretic Peptide: 43 pg/mL (ref 0.0–100.0)

## 2016-05-10 LAB — I-STAT TROPONIN, ED: Troponin i, poc: 0 ng/mL (ref 0.00–0.08)

## 2016-05-10 MED ORDER — HYDRALAZINE HCL 20 MG/ML IJ SOLN: 5.0000 mg | INTRAMUSCULAR | Status: DC | PRN

## 2016-05-10 MED ORDER — SODIUM CHLORIDE 0.9% FLUSH: 3.0000 mL | Freq: Two times a day (BID) | INTRAVENOUS | Status: DC

## 2016-05-10 MED ORDER — HYDROCHLOROTHIAZIDE 25 MG PO TABS
25.0000 mg | ORAL_TABLET | Freq: Every day | ORAL | Status: DC
  Administered 2016-05-10 – 2016-05-11 (×2): 25 mg via ORAL
  Filled 2016-05-10 (×2): qty 1

## 2016-05-10 MED ORDER — MORPHINE SULFATE (PF) 4 MG/ML IV SOLN: 2.0000 mg | INTRAVENOUS | Status: DC | PRN

## 2016-05-10 MED ORDER — GI COCKTAIL ~~LOC~~
30.0000 mL | Freq: Three times a day (TID) | ORAL | Status: DC | PRN
  Administered 2016-05-10: 30 mL via ORAL
  Filled 2016-05-10: qty 30

## 2016-05-10 MED ORDER — NITROGLYCERIN 0.4 MG SL SUBL: 0.4000 mg | SUBLINGUAL_TABLET | SUBLINGUAL | Status: DC | PRN

## 2016-05-10 MED ORDER — KETOROLAC TROMETHAMINE 30 MG/ML IJ SOLN
30.0000 mg | Freq: Four times a day (QID) | INTRAMUSCULAR | Status: DC | PRN
  Administered 2016-05-11: 30 mg via INTRAVENOUS
  Filled 2016-05-10: qty 1

## 2016-05-10 MED ORDER — INFLUENZA VAC SPLIT QUAD 0.5 ML IM SUSY
0.5000 mL | PREFILLED_SYRINGE | INTRAMUSCULAR | Status: AC
  Administered 2016-05-11: 0.5 mL via INTRAMUSCULAR
  Filled 2016-05-10: qty 0.5

## 2016-05-10 MED ORDER — ONDANSETRON HCL 4 MG/2ML IJ SOLN: 4.0000 mg | Freq: Four times a day (QID) | INTRAMUSCULAR | Status: DC | PRN

## 2016-05-10 MED ORDER — ACETAMINOPHEN 325 MG PO TABS
650.0000 mg | ORAL_TABLET | ORAL | Status: DC | PRN
  Administered 2016-05-10 – 2016-05-11 (×2): 650 mg via ORAL
  Filled 2016-05-10 (×2): qty 2

## 2016-05-10 MED ORDER — SODIUM CHLORIDE 0.9% FLUSH: 3.0000 mL | INTRAVENOUS | Status: DC | PRN

## 2016-05-10 MED ORDER — IRBESARTAN 75 MG PO TABS
75.0000 mg | ORAL_TABLET | Freq: Every day | ORAL | Status: DC
  Administered 2016-05-10 – 2016-05-11 (×2): 75 mg via ORAL
  Filled 2016-05-10 (×2): qty 1

## 2016-05-10 MED ORDER — GI COCKTAIL ~~LOC~~
30.0000 mL | Freq: Four times a day (QID) | ORAL | Status: DC | PRN
  Administered 2016-05-11: 30 mL via ORAL
  Filled 2016-05-10: qty 30

## 2016-05-10 MED ORDER — ONDANSETRON HCL 4 MG PO TABS: 4.0000 mg | ORAL_TABLET | Freq: Four times a day (QID) | ORAL | Status: DC | PRN

## 2016-05-10 MED ORDER — ALPRAZOLAM 0.25 MG PO TABS: 0.2500 mg | ORAL_TABLET | Freq: Two times a day (BID) | ORAL | Status: DC | PRN

## 2016-05-10 MED ORDER — SODIUM CHLORIDE 0.9 % IV SOLN: 250.0000 mL | INTRAVENOUS | Status: DC | PRN

## 2016-05-10 MED ORDER — PANTOPRAZOLE SODIUM 40 MG PO TBEC
40.0000 mg | DELAYED_RELEASE_TABLET | Freq: Every day | ORAL | Status: DC
  Administered 2016-05-10 – 2016-05-11 (×2): 40 mg via ORAL
  Filled 2016-05-10 (×2): qty 1

## 2016-05-10 MED ORDER — ENOXAPARIN SODIUM 40 MG/0.4ML ~~LOC~~ SOLN
40.0000 mg | SUBCUTANEOUS | Status: DC
  Administered 2016-05-10: 40 mg via SUBCUTANEOUS
  Filled 2016-05-10: qty 0.4

## 2016-05-10 MED ORDER — ACETAMINOPHEN 650 MG RE SUPP
650.0000 mg | Freq: Four times a day (QID) | RECTAL | Status: DC | PRN
Start: 2016-05-10 — End: 2016-05-10

## 2016-05-10 MED ORDER — ASPIRIN 81 MG PO CHEW
324.0000 mg | CHEWABLE_TABLET | Freq: Once | ORAL | Status: AC
  Administered 2016-05-10: 324 mg via ORAL
  Filled 2016-05-10: qty 4

## 2016-05-10 MED ORDER — NITROGLYCERIN 2 % TD OINT
1.0000 [in_us] | TOPICAL_OINTMENT | Freq: Once | TRANSDERMAL | Status: AC
Start: 2016-05-10 — End: 2016-05-10
  Administered 2016-05-10: 1 [in_us] via TOPICAL
  Filled 2016-05-10: qty 1

## 2016-05-10 MED ORDER — LOSARTAN POTASSIUM 50 MG PO TABS
50.0000 mg | ORAL_TABLET | Freq: Once | ORAL | Status: AC
  Administered 2016-05-10: 50 mg via ORAL
  Filled 2016-05-10: qty 1

## 2016-05-10 MED ORDER — ACETAMINOPHEN 325 MG PO TABS: 650.0000 mg | ORAL_TABLET | Freq: Four times a day (QID) | ORAL | Status: DC | PRN

## 2016-05-10 MED ORDER — SODIUM CHLORIDE 0.9% FLUSH
3.0000 mL | Freq: Two times a day (BID) | INTRAVENOUS | Status: DC
  Administered 2016-05-10 – 2016-05-11 (×2): 3 mL via INTRAVENOUS

## 2016-05-10 MED ORDER — CARVEDILOL 12.5 MG PO TABS
12.5000 mg | ORAL_TABLET | Freq: Once | ORAL | Status: AC
Start: 2016-05-10 — End: 2016-05-10
  Administered 2016-05-10: 12.5 mg via ORAL
  Filled 2016-05-10: qty 1

## 2016-05-10 MED ORDER — ENOXAPARIN SODIUM 40 MG/0.4ML ~~LOC~~ SOLN: 40.0000 mg | SUBCUTANEOUS | Status: DC

## 2016-05-10 NOTE — ED Notes (Signed)
Sending down BNP

## 2016-05-10 NOTE — ED Notes (Addendum)
Called lab to add bnp

## 2016-05-10 NOTE — ED Notes (Signed)
Paged Dr Marily Memos regarding pt continued CP with movement. MD ok with pt being transferred to telemetry unit despite CP. States pain is musculoskeletal in nature. Pt. Resting in bed with eyes closed, rise and fall of chest noted on assessment.

## 2016-05-10 NOTE — ED Triage Notes (Addendum)
Pt is pale and mildly diaphorectic; reports chest pain centralized radiating into left jaw and arm started Friday but got worse last night; became dizzy. This morning she decided she was not going to work. Pt is SOB just sitting. Hx of obesity, asthma, HTN.

## 2016-05-10 NOTE — ED Notes (Signed)
ED Provider at bedside. 

## 2016-05-10 NOTE — ED Provider Notes (Signed)
[place] DEPT Provider Note   CSN: [id number] Arrival date & time: 05/10/16  1218     History   Chief Complaint Chief Complaint  Patient presents with  . Chest Pain   .  HPI   Blood pressure (!) 163/110, pulse 77, temperature 97.9 F (36.6 C), temperature source Oral, resp. rate 15, SpO2 100 %.  Kristi [name] is a 60 y.o. female with past medical history significant for hypertension (been out of her medications for over a year secondary to issues with insurance and money) complaining of left-sided chest pain onset 3 days ago becoming more severe and constant since yesterday. She states that the pain also started radiating to the jaw on the left arm yesterday she is a former smoker, family history of heart attacks with quadruple bypass in her mother. Patient is obese, she does not take a daily aspirin. She endorses PND last night, no orthopnea. She endorses cough with no fever or chills, dyspnea on exertion worsening in severity over the last week, she endorses increasing peripheral edema.   No HTN meds in 1 year 2/2 constant since yesterday  Past Medical History:  Diagnosis Date  . Angina at rest [facility])   . Arthritis   . Asthma   . GERD (gastroesophageal reflux disease)   . Hypertension     There are no active problems to display for this patient.   Past Surgical History:  Procedure Laterality Date  . ABDOMINAL HYSTERECTOMY    . APPENDECTOMY    . CHOLECYSTECTOMY      OB History    No data available       Home Medications    Prior to Admission medications   Medication Sig Start Date End Date Taking? Authorizing Provider  naproxen sodium (ANAPROX) 220 MG tablet Take 440 mg by mouth 2 (two) times daily.    Yes Historical Provider, MD  omeprazole (PRILOSEC OTC) 20 MG tablet Take 20 mg by mouth daily as needed (for acid reflux).   Yes Historical Provider, MD    Family History History reviewed. No pertinent family history.  Social History Social History    Substance Use Topics  . Smoking status: Former Research scientist (life sciences)  . Smokeless tobacco: Not on file  . Alcohol use No     Allergies   Iodine and Sulfur   Review of Systems Review of Systems  10 systems reviewed and found to be negative, except as noted in the HPI.   Physical Exam Updated Vital Signs BP (!) 163/110 (BP Location: Right Arm)   Pulse 77   Temp 97.9 F (36.6 C) (Oral)   Resp 15   SpO2 100%   Physical Exam  Constitutional: She is oriented to person, place, and time. She appears well-developed and well-nourished. No distress.  Obese  HENT:  Head: Normocephalic.  Mouth/Throat: Oropharynx is clear and moist.  Eyes: Conjunctivae are normal.  Neck: Normal range of motion. No JVD present. No tracheal deviation present.  Cardiovascular: Normal rate, regular rhythm and intact distal pulses.   Radial pulse equal bilaterally  Pulmonary/Chest: Effort normal and breath sounds normal. No stridor. No respiratory distress. She has no wheezes. She has no rales. She exhibits no tenderness.  Abdominal: Soft. She exhibits no distension and no mass. There is no tenderness. There is no rebound and no guarding.  Musculoskeletal: Normal range of motion. She exhibits no edema or tenderness.  No calf asymmetry, superficial collaterals, palpable cords, edema, Homans sign negative bilaterally.    Neurological: She is  alert and oriented to person, place, and time.  Skin: Skin is warm. She is not diaphoretic.  Psychiatric: She has a normal mood and affect.  Nursing note and vitals reviewed.    ED Treatments / Results  Labs (all labs ordered are listed, but only abnormal results are displayed) Labs Reviewed  COMPREHENSIVE METABOLIC PANEL - Abnormal; Notable for the following:       Result Value   ALT 12 (*)    All other components within normal limits  CBC WITH DIFFERENTIAL/PLATELET  BRAIN NATRIURETIC PEPTIDE  I-STAT TROPOININ, ED    EKG  EKG Interpretation  Date/Time:  Monday  May 10 2016 12:27:22 EST Ventricular Rate:  93 PR Interval:  156 QRS Duration: 82 QT Interval:  338 QTC Calculation: 420 R Axis:   47 Text Interpretation:  Normal sinus rhythm Artifact No significant change since last tracing Borderline ECG Confirmed by Carmin Muskrat  MD ([id number]) on 05/10/2016 2:39:59 PM       Radiology Dg Chest 2 View  Result Date: 05/10/2016 CLINICAL DATA:  Chest pain.  Diaphoresis. EXAM: CHEST  2 VIEW COMPARISON:  CT 12/29/2014.  12/25/2014 . FINDINGS: Mediastinum and hilar structures normal. Mild stable cardiomegaly with normal pulmonary vascularity. Low lung volumes with mild basilar atelectasis and/or scarring. Surgical clips right abdomen. IMPRESSION: 1. Low lung volumes with mild bibasilar atelectasis and/or scarring . 2.  Mild stable cardiomegaly, no pulmonary venous congestion . Electronically Signed   By: Marcello Moores  Register   On: 05/10/2016 12:47    Procedures Procedures (including critical care time)  Medications Ordered in ED Medications  aspirin chewable tablet 324 mg (324 mg Oral Given 05/10/16 1433)  nitroGLYCERIN (NITROGLYN) 2 % ointment 1 inch (1 inch Topical Given 05/10/16 1434)  losartan (COZAAR) tablet 50 mg (50 mg Oral Given 05/10/16 1500)  carvedilol (COREG) tablet 12.5 mg (12.5 mg Oral Given 05/10/16 1500)     Initial Impression / Assessment and Plan / ED Course  I have reviewed the triage vital signs and the nursing notes.  Pertinent labs & imaging results that were available during my care of the patient were reviewed by me and considered in my medical decision making (see chart for details).  Clinical Course     Vitals:   05/10/16 1227 05/10/16 1412  BP: 161/87 (!) 163/110  Pulse: 87 77  Resp: 18 15  Temp: 98 F (36.7 C) 97.9 F (36.6 C)  TempSrc: Oral Oral  SpO2: 97% 100%    Medications  aspirin chewable tablet 324 mg (324 mg Oral Given 05/10/16 1433)  nitroGLYCERIN (NITROGLYN) 2 % ointment 1 inch (1 inch Topical Given  05/10/16 1434)  losartan (COZAAR) tablet 50 mg (50 mg Oral Given 05/10/16 1500)  carvedilol (COREG) tablet 12.5 mg (12.5 mg Oral Given 05/10/16 1500)    Kristi [name] is 60 y.o. female presenting with dyspnea on exertion, left-sided chest pain, onset sev EKG with mild stable cardiomegaly no pulmonary vascular congestion eral days ago but significantly worsening in the past 24 hours. Lung sounds are clear, does not appear clinically volume overloaded, EKG with no acute changes, troponin negative. Given her severe chest pain, she is high risk by heart score. Will need admission for chest pain rule out, home hypertension medications given, she has not had any of these in the year due to issues with her finances.  Nitroglycerin has improved her pain from 928  Unassigned admission to [facility] hospitalist Dr. Marily Memos.  Final Clinical Impressions(s) / ED Diagnoses  Final diagnoses:  Chest pain, unspecified type    New Prescriptions New Prescriptions   No medications on file     Monico Blitz, PA-C 05/10/16 1501    Carmin Muskrat, MD 05/11/16 0800

## 2016-05-10 NOTE — H&P (Signed)
History and Physical    Kristi [name] [id number] DOB: 09/19/1955 DOA: 05/10/2016  PCP: Philis Fendt, MD Patient coming from: home  Chief Complaint: CP.   HPI: Kristi [name] is a 60 y.o. female with medical history significant of GERD, obesity, hypertension, asthma, presenting with multiple types of chest pain. Patient with finding of a 1-1-1/2 week history of left upper chest wall pain. Worse with palpation. Constant. This started several days after falling in an elevator. Denies any head trauma or LOC, short of breath. For the last 3 days patient is complaining of a squeezing kind of chest pain around her heart that last approximately 1 minute and radiates to her neck, jaw and left arm. Worse with exertion. Improves with rest. Denies any lower extremity swelling, palpitations, shortness of breath, fevers, diarrhea, dysuria, frequency. Patient doesn't endorse feeling somewhat flushed when these episodes come on. Patient states that she's been out of blood pressure medication since February 2016 edition no insurance and not having money.  Patient also with chronic bilateral knee pain. Worse over the last several weeks. Uses a cane to ambulate. Endorses more frequent falls due to pain and general weakness in her legs bilaterally.   ED Course: Objective findings outlined below. Improvement with aspirin and nitroglycerin.  Review of Systems: As per HPI otherwise 10 point review of systems negative.   Ambulatory Status:uses a cain for ambulation  Past Medical History:  Diagnosis Date  . Angina at rest [facility])   . Arthritis   . Asthma   . GERD (gastroesophageal reflux disease)   . Hypertension     Past Surgical History:  Procedure Laterality Date  . ABDOMINAL HYSTERECTOMY    . APPENDECTOMY    . CHOLECYSTECTOMY      Social History   Social History  . Marital status: Divorced    Spouse name: N/A  . Number of children: N/A  . Years of education: N/A   Occupational History    . Not on file.   Social History Main Topics  . Smoking status: Former Research scientist (life sciences)  . Smokeless tobacco: Not on file  . Alcohol use No  . Drug use: No  . Sexual activity: Not on file   Other Topics Concern  . Not on file   Social History Narrative  . No narrative on file    Allergies  Allergen Reactions  . Iodine Anaphylaxis  . Sulfur Hives    Family History  Problem Relation Age of Onset  . Heart disease Mother     3 CABG, stents placements.   . Heart attack Mother     late 54s  . Cancer Mother   . Diabetes Mother   . Diabetes Other   . Heart disease Other     Prior to Admission medications   Medication Sig Start Date End Date Taking? Authorizing Provider  naproxen sodium (ANAPROX) 220 MG tablet Take 440 mg by mouth 2 (two) times daily.    Yes Historical Provider, MD  omeprazole (PRILOSEC OTC) 20 MG tablet Take 20 mg by mouth daily as needed (for acid reflux).   Yes Historical Provider, MD    Physical Exam: Vitals:   05/10/16 1600 05/10/16 1615 05/10/16 1645 05/10/16 1700  BP: 146/96 112/86 123/87 122/70  Pulse: 73 77 72 71  Resp: 20   20  Temp:      TempSrc:      SpO2: 95% 97% 98% 96%     General:  Appears calm and comfortable Eyes:  PERRL,  EOMI, normal lids, iris ENT:  grossly normal hearing, lips & tongue, mmm Neck:  no LAD, masses or thyromegaly Cardiovascular:  RRR, no m/r/g. No LE edema.  Respiratory:  CTA bilaterally, no w/r/r. Normal respiratory effort. Abdomen:  soft, ntnd, NABS Skin:  no rash or induration seen on limited exam Musculoskeletal: Upper chest wall tenderness to palpation. grossly normal tone BUE/BLE, good ROM, no bony abnormality Psychiatric:  grossly normal mood and affect, speech fluent and appropriate, AOx3 Neurologic:  CN 2-12 grossly intact, moves all extremities in coordinated fashion, sensation intact  Labs on Admission: I have personally reviewed following labs and imaging studies  CBC:  Recent Labs Lab 05/10/16 1235   WBC 6.0  NEUTROABS 3.4  HGB 13.0  HCT 41.4  MCV 86.4  PLT 0000000   Basic Metabolic Panel:  Recent Labs Lab 05/10/16 1235  NA 144  K 4.2  CL 107  CO2 30  GLUCOSE 89  BUN 12  CREATININE 0.85  CALCIUM 9.3   GFR: CrCl cannot be calculated (Unknown ideal weight.). Liver Function Tests:  Recent Labs Lab 05/10/16 1235  AST 20  ALT 12*  ALKPHOS 51  BILITOT 0.3  PROT 6.7  ALBUMIN 3.7   No results for input(s): LIPASE, AMYLASE in the last 168 hours. No results for input(s): AMMONIA in the last 168 hours. Coagulation Profile: No results for input(s): INR, PROTIME in the last 168 hours. Cardiac Enzymes: No results for input(s): CKTOTAL, CKMB, CKMBINDEX, TROPONINI in the last 168 hours. BNP (last 3 results) No results for input(s): PROBNP in the last 8760 hours. HbA1C: No results for input(s): HGBA1C in the last 72 hours. CBG: No results for input(s): GLUCAP in the last 168 hours. Lipid Profile: No results for input(s): CHOL, HDL, LDLCALC, TRIG, CHOLHDL, LDLDIRECT in the last 72 hours. Thyroid Function Tests: No results for input(s): TSH, T4TOTAL, FREET4, T3FREE, THYROIDAB in the last 72 hours. Anemia Panel: No results for input(s): VITAMINB12, FOLATE, FERRITIN, TIBC, IRON, RETICCTPCT in the last 72 hours. Urine analysis: No results found for: COLORURINE, APPEARANCEUR, LABSPEC, PHURINE, GLUCOSEU, HGBUR, BILIRUBINUR, KETONESUR, PROTEINUR, UROBILINOGEN, NITRITE, LEUKOCYTESUR  Creatinine Clearance: CrCl cannot be calculated (Unknown ideal weight.).  Sepsis Labs: @LABRCNTIP (procalcitonin:4,lacticidven:4) )No results found for this or any previous visit (from the past 240 hour(s)).   Radiological Exams on Admission: Dg Chest 2 View  Result Date: 05/10/2016 CLINICAL DATA:  Chest pain.  Diaphoresis. EXAM: CHEST  2 VIEW COMPARISON:  CT 12/29/2014.  12/25/2014 . FINDINGS: Mediastinum and hilar structures normal. Mild stable cardiomegaly with normal pulmonary vascularity.  Low lung volumes with mild basilar atelectasis and/or scarring. Surgical clips right abdomen. IMPRESSION: 1. Low lung volumes with mild bibasilar atelectasis and/or scarring . 2.  Mild stable cardiomegaly, no pulmonary venous congestion . Electronically Signed   By: Marcello Moores  Register   On: 05/10/2016 12:47    EKG: Independently reviewed. Sinus, no ACS.   Assessment/Plan Active Problems:   Atypical chest pain   HTN (hypertension)   Hypertensive emergency   Anxiety   CP: likely multifactorial including MSK, GI, ANxiety, and possibly cardiac. Strong fmhx, obese, HTN. Initial pain started after a fall and is reproducible on palpation. Patient also endorses a history of anxiety without any benzodiazepines in more than a year due to not having a doctor. Some improvement w/ ASA and nitro and later w/ GI cocktail. CXR w/o evidence of MSK process or other pathology.  - Tele - cycle trop - EKG - GI cocktail - Toradol - ASA -  Xanax PRN  HTN: Technically meets hypertensive emergency criteria. Systolic pressure as high as 0000000 with diastolic pressure as high as 125. Patient is visit off antihypertensives for approximately a year and a half due to insurance and cost. Present Hydrocort thiazide and Benicar. - HCTZ, Benicar - Hydralazine  Anxiety: - Xanax prn  GERD: - start 14 day trial ppi - GI cocktail as above.   DVT prophylaxis: Lovenox  Code Status: full  Family Communication: son  Disposition Plan: pending cp r/o  Consults called: none  Admission status: obs - tele    MERRELL, DAVID J MD [facility]  If 7PM-7AM, please contact night-coverage [email or web address] Password [facility]  05/10/2016, 5:23 PM

## 2016-05-11 ENCOUNTER — Observation Stay (HOSPITAL_COMMUNITY): Payer: Self-pay

## 2016-05-11 ENCOUNTER — Other Ambulatory Visit: Payer: Self-pay

## 2016-05-11 ENCOUNTER — Other Ambulatory Visit (HOSPITAL_COMMUNITY): Payer: Medicaid Other

## 2016-05-11 DIAGNOSIS — R079 Chest pain, unspecified: Secondary | ICD-10-CM | POA: Diagnosis not present

## 2016-05-11 DIAGNOSIS — I10 Essential (primary) hypertension: Secondary | ICD-10-CM

## 2016-05-11 LAB — CBC
HCT: 37.1 % (ref 36.0–46.0)
HEMOGLOBIN: 11.7 g/dL — AB (ref 12.0–15.0)
MCH: 27.5 pg (ref 26.0–34.0)
MCHC: 31.5 g/dL (ref 30.0–36.0)
MCV: 87.1 fL (ref 78.0–100.0)
Platelets: 199 10*3/uL (ref 150–400)
RBC: 4.26 MIL/uL (ref 3.87–5.11)
RDW: 14.5 % (ref 11.5–15.5)
WBC: 5.8 10*3/uL (ref 4.0–10.5)

## 2016-05-11 LAB — BASIC METABOLIC PANEL
ANION GAP: 6 (ref 5–15)
BUN: 14 mg/dL (ref 6–20)
CALCIUM: 8.7 mg/dL — AB (ref 8.9–10.3)
CHLORIDE: 104 mmol/L (ref 101–111)
CO2: 29 mmol/L (ref 22–32)
CREATININE: 1 mg/dL (ref 0.44–1.00)
GFR calc non Af Amer: 60 mL/min — ABNORMAL LOW (ref >60)
Glucose, Bld: 91 mg/dL (ref 65–99)
Potassium: 3.8 mmol/L (ref 3.5–5.1)
SODIUM: 139 mmol/L (ref 135–145)

## 2016-05-11 LAB — ECHOCARDIOGRAM COMPLETE
Height: 65 in
Weight: 4694.4 oz

## 2016-05-11 LAB — TROPONIN I

## 2016-05-11 MED ORDER — ACETAMINOPHEN 325 MG PO TABS: 650.0000 mg | ORAL_TABLET | ORAL | Status: DC | PRN

## 2016-05-11 MED ORDER — HYDROCHLOROTHIAZIDE 25 MG PO TABS: 25.0000 mg | ORAL_TABLET | Freq: Every day | ORAL | 0 refills | Status: DC

## 2016-05-11 MED ORDER — IRBESARTAN 75 MG PO TABS: 75.0000 mg | ORAL_TABLET | Freq: Every day | ORAL | 0 refills | Status: DC

## 2016-05-11 MED ORDER — ASPIRIN EC 81 MG PO TBEC: 81.0000 mg | DELAYED_RELEASE_TABLET | Freq: Every day | ORAL | 0 refills | Status: DC

## 2016-05-11 NOTE — Progress Notes (Signed)
Patient given discharge instructions and all questions answered.  Pt. Discharged via wheelchair with all belongings.   

## 2016-05-11 NOTE — Discharge Instructions (Signed)
Follow with Primary MD Edwin A Avbuere, MD in 7 days  ° °Get CBC, CMP,checked  by Primary MD next visit.  ° ° °Activity: As tolerated with Full fall precautions use walker/cane & assistance as needed ° ° °Disposition Home  ° ° °Diet: Heart Healthy  , with feeding assistance and aspiration precautions. ° °For Heart failure patients - Check your Weight same time everyday, if you gain over 2 pounds, or you develop in leg swelling, experience more shortness of breath or chest pain, call your Primary MD immediately. Follow Cardiac Low Salt Diet and 1.5 lit/day fluid restriction. ° ° °On your next visit with your primary care physician please Get Medicines reviewed and adjusted. ° ° °Please request your Prim.MD to go over all Hospital Tests and Procedure/Radiological results at the follow up, please get all Hospital records sent to your Prim MD by signing hospital release before you go home. ° ° °If you experience worsening of your admission symptoms, develop shortness of breath, life threatening emergency, suicidal or homicidal thoughts you must seek medical attention immediately by calling 911 or calling your MD immediately  if symptoms less severe. ° °You Must read complete instructions/literature along with all the possible adverse reactions/side effects for all the Medicines you take and that have been prescribed to you. Take any new Medicines after you have completely understood and accpet all the possible adverse reactions/side effects.  ° °Do not drive, operating heavy machinery, perform activities at heights, swimming or participation in water activities or provide baby sitting services if your were admitted for syncope or siezures until you have seen by Primary MD or a Neurologist and advised to do so again. ° °Do not drive when taking Pain medications.  ° ° °Do not take more than prescribed Pain, Sleep and Anxiety Medications ° °Special Instructions: If you have smoked or chewed Tobacco  in the last 2 yrs  please stop smoking, stop any regular Alcohol  and or any Recreational drug use. ° °Wear Seat belts while driving. ° ° °Please note ° °You were cared for by a hospitalist during your hospital stay. If you have any questions about your discharge medications or the care you received while you were in the hospital after you are discharged, you can call the unit and asked to speak with the hospitalist on call if the hospitalist that took care of you is not available. Once you are discharged, your primary care physician will handle any further medical issues. Please note that NO REFILLS for any discharge medications will be authorized once you are discharged, as it is imperative that you return to your primary care physician (or establish a relationship with a primary care physician if you do not have one) for your aftercare needs so that they can reassess your need for medications and monitor your lab values. ° °

## 2016-05-11 NOTE — Discharge Summary (Signed)
Genie Thissen, is a 60 y.o. female  DOB [birth year]  MRN [id number].  Admission date:  05/10/2016  Admitting Physician  Waldemar Dickens, MD  Discharge Date:  05/11/2016   Primary MD  Philis Fendt, MD  Recommendations for primary care physician for things to follow:  - Please check CBC, BMP during next visit, monitor hypertension and adjust medication as needed.   Admission Diagnosis  Chest pain, unspecified type [R07.9]   Discharge Diagnosis  Chest pain, unspecified type [R07.9]    Active Problems:   Atypical chest pain   HTN (hypertension)   Hypertensive emergency   Anxiety      Past Medical History:  Diagnosis Date  . Angina at rest [facility])   . Arthritis   . Asthma   . GERD (gastroesophageal reflux disease)   . Hypertension     Past Surgical History:  Procedure Laterality Date  . ABDOMINAL HYSTERECTOMY    . APPENDECTOMY    . CHOLECYSTECTOMY         History of present illness and  Hospital Course:     Kindly see H&P for history of present illness and admission details, please review complete Labs, Consult reports and Test reports for all details in brief  HPI  from the history and physical done on the day of admission 05/10/2016  HPI: Vetra Previti is a 60 y.o. female with medical history significant of GERD, obesity, hypertension, asthma, presenting with multiple types of chest pain. Patient with finding of a 1-1-1/2 week history of left upper chest wall pain. Worse with palpation. Constant. This started several days after falling in an elevator. Denies any head trauma or LOC, short of breath. For the last 3 days patient is complaining of a squeezing kind of chest pain around her heart that last approximately 1 minute and radiates to her neck, jaw and left arm. Worse with exertion. Improves with rest. Denies any lower extremity swelling, palpitations, shortness of breath, fevers,  diarrhea, dysuria, frequency. Patient doesn't endorse feeling somewhat flushed when these episodes come on. Patient states that she's been out of blood pressure medication since February 2016 edition no insurance and not having money.  Patient also with chronic bilateral knee pain. Worse over the last several weeks. Uses a cane to ambulate. Endorses more frequent falls due to pain and general weakness in her legs bilaterally.   ED Course: Objective findings outlined below. Improvement with aspirin and nitroglycerin.   Hospital Course   Chest pain - Nontypical, appears to be secondary to muscular skeletal origin, admitted overnight for observation, troponins negative 3, repeat EKG this a.m. nonacute, pain is reproducible by palpation, 2-D echo with preserved EF 60-65% with no regional wall motion abnormalities, so no further workup as an inpatient, started on aspirin 81 mg oral daily, and to start following with PCP.   Hypertension - Not Taking any medication for one year, resume back on hydrochlorothiazide/irbesartan  GERD - Continue with PPI   Discharge Condition:  Stable   Follow UP  Follow-up  [place] [facility] Follow up on 05/17/2016.   Why:  Please call on Monday for appointment on 11-16 or 11-17 Contact information: [address] [place] [phone]            Discharge Instructions  and  Discharge Medications     Discharge Instructions    Discharge instructions    Complete by:  As directed    Follow with Primary MD Philis Fendt, MD in 7 days   Get CBC, CMP,checked  by Primary MD next visit.    Activity: As tolerated with Full fall precautions use walker/cane & assistance as needed   Disposition Home    Diet: Heart Healthy , with feeding assistance and aspiration precautions.  For Heart failure patients - Check your Weight same time everyday, if you gain over 2 pounds, or  you develop in leg swelling, experience more shortness of breath or chest pain, call your Primary MD immediately. Follow Cardiac Low Salt Diet and 1.5 lit/day fluid restriction.   On your next visit with your primary care physician please Get Medicines reviewed and adjusted.   Please request your Prim.MD to go over all Hospital Tests and Procedure/Radiological results at the follow up, please get all Hospital records sent to your Prim MD by signing hospital release before you go home.   If you experience worsening of your admission symptoms, develop shortness of breath, life threatening emergency, suicidal or homicidal thoughts you must seek medical attention immediately by calling 911 or calling your MD immediately  if symptoms less severe.  You Must read complete instructions/literature along with all the possible adverse reactions/side effects for all the Medicines you take and that have been prescribed to you. Take any new Medicines after you have completely understood and accpet all the possible adverse reactions/side effects.   Do not drive, operating heavy machinery, perform activities at heights, swimming or participation in water activities or provide baby sitting services if your were admitted for syncope or siezures until you have seen by Primary MD or a Neurologist and advised to do so again.  Do not drive when taking Pain medications.    Do not take more than prescribed Pain, Sleep and Anxiety Medications  Special Instructions: If you have smoked or chewed Tobacco  in the last 2 yrs please stop smoking, stop any regular Alcohol  and or any Recreational drug use.  Wear Seat belts while driving.   Please note  You were cared for by a hospitalist during your hospital stay. If you have any questions about your discharge medications or the care you received while you were in the hospital after you are discharged, you can call the unit and asked to speak with the hospitalist on call  if the hospitalist that took care of you is not available. Once you are discharged, your primary care physician will handle any further medical issues. Please note that NO REFILLS for any discharge medications will be authorized once you are discharged, as it is imperative that you return to your primary care physician (or establish a relationship with a primary care physician if you do not have one) for your aftercare needs so that they can reassess your need for medications and monitor your lab values.   Increase activity slowly    Complete by:  As directed        Medication List    STOP taking these medications   naproxen sodium 220  MG tablet Commonly known as:  ANAPROX     TAKE these medications   acetaminophen 325 MG tablet Commonly known as:  TYLENOL Take 2 tablets (650 mg total) by mouth every 4 (four) hours as needed for headache or mild pain.   aspirin EC 81 MG tablet Take 1 tablet (81 mg total) by mouth daily.   hydrochlorothiazide 25 MG tablet Commonly known as:  HYDRODIURIL Take 1 tablet (25 mg total) by mouth daily. Start taking on:  05/12/2016   irbesartan 75 MG tablet Commonly known as:  AVAPRO Take 1 tablet (75 mg total) by mouth daily. Start taking on:  05/12/2016   omeprazole 20 MG tablet Commonly known as:  PRILOSEC OTC Take 20 mg by mouth daily as needed (for acid reflux).         Diet and Activity recommendation: See Discharge Instructions above   Consults obtained -  none   Major procedures and Radiology Reports - PLEASE review detailed and final reports for all details, in brief -     Dg Chest 2 View  Result Date: 05/10/2016 CLINICAL DATA:  Chest pain.  Diaphoresis. EXAM: CHEST  2 VIEW COMPARISON:  CT 12/29/2014.  12/25/2014 . FINDINGS: Mediastinum and hilar structures normal. Mild stable cardiomegaly with normal pulmonary vascularity. Low lung volumes with mild basilar atelectasis and/or scarring. Surgical clips right abdomen. IMPRESSION: 1.  Low lung volumes with mild bibasilar atelectasis and/or scarring . 2.  Mild stable cardiomegaly, no pulmonary venous congestion . Electronically Signed   By: Marcello Moores  Register   On: 05/10/2016 12:47    Micro Results     No results found for this or any previous visit (from the past 240 hour(s)).     Today   Subjective:   Ryliegh Brickhouse today has no headache,no chest or  abdominal pain,no new weakness tingling or numbness, feels much better wants to go home today.   Objective:   Blood pressure 129/69, pulse 67, temperature 98.2 F (36.8 C), temperature source Oral, resp. rate 18, height 5\' 5"  (1.651 m), weight 133.1 kg (293 lb 6.4 oz), SpO2 100 %.   Intake/Output Summary (Last 24 hours) at 05/11/16 1250 Last data filed at 05/11/16 1025  Gross per 24 hour  Intake              663 ml  Output             2200 ml  Net            -1537 ml    Exam Awake Alert, Oriented x 3, No new F.N deficits, Normal affect [place].AT,PERRAL Supple Neck,No JVD, No cervical lymphadenopathy appriciated.  Symmetrical Chest wall movement, Good air movement bilaterally, CTAB, has reproducible chest pain on palpation RRR,No Gallops,Rubs or new Murmurs, No Parasternal Heave +ve B.Sounds, Abd Soft, Non tender, No organomegaly appriciated, No rebound -guarding or rigidity. No Cyanosis, Clubbing or edema, No new Rash or bruise  Data Review   CBC w Diff: Lab Results  Component Value Date   WBC 5.8 05/11/2016   HGB 11.7 (L) 05/11/2016   HCT 37.1 05/11/2016   PLT 199 05/11/2016   LYMPHOPCT 34 05/10/2016   MONOPCT 9 05/10/2016   EOSPCT 1 05/10/2016   BASOPCT 0 05/10/2016    CMP: Lab Results  Component Value Date   NA 139 05/11/2016   K 3.8 05/11/2016   CL 104 05/11/2016   CO2 29 05/11/2016   BUN 14 05/11/2016   CREATININE 1.00 05/11/2016   PROT  6.7 05/10/2016   ALBUMIN 3.7 05/10/2016   BILITOT 0.3 05/10/2016   ALKPHOS 51 05/10/2016   AST 20 05/10/2016   ALT 12 (L) 05/10/2016  .   Total  Time in preparing paper work, data evaluation and todays exam - 35 minutes  Ayushi Pla M.D on 05/11/2016 at 12:50 PM  [facility]   Office  [phone]

## 2016-05-14 IMAGING — NM NM PULMONARY PERF PARTICULATE
11 series · 11 of 11 positions shown · non-contrast
Comparison: Chest radiograph and chest CT December 25, 2014

CLINICAL DATA: Chest pain and shortness of breath

[Series 1: ant/post vent · 4.14mm/px · 1 of 1 slices shown (1 of 2)]
[im 1/1]
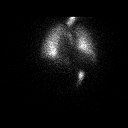

[Series 1: ant/post vent · 4.14mm/px · 1 of 1 slices shown (2 of 2)]
[im 1/1]
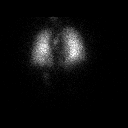

[Series 2: lao/rpo vent · 4.14mm/px · 1 of 1 slices shown (1 of 2)]
[im 1/1]
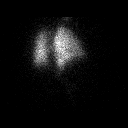

[Series 2: lao/rpo vent · 4.14mm/px · 1 of 1 slices shown (2 of 2)]
[im 1/1]
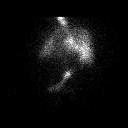

[Series 3: lpo/rao vent · 4.14mm/px · 1 of 1 slices shown (1 of 2)]
[im 1/1]
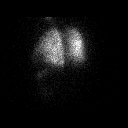

[Series 3: lpo/rao vent · 4.14mm/px · 1 of 1 slices shown (2 of 2)]
[im 1/1]
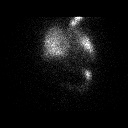

[Series 4: lpo/rao perf · 4.14mm/px · 1 of 1 slices shown]
[im 1/1]
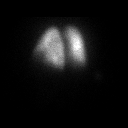

[Series 5: ant/post perf · 4.14mm/px · 1 of 1 slices shown (1 of 2)]
[im 1/1]
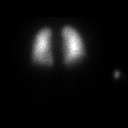

[Series 5: ant/post perf · 4.14mm/px · 1 of 1 slices shown (2 of 2)]
[im 1/1]
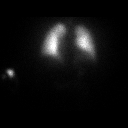

[Series 6: lao/rpo perf · 4.14mm/px · 1 of 1 slices shown (1 of 2)]
[im 1/1]
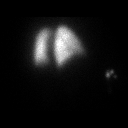

[Series 6: lao/rpo perf · 4.14mm/px · 1 of 1 slices shown (2 of 2)]
[im 1/1]
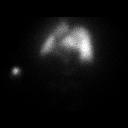

[11 of 11 positions shown; findings below may reference images not displayed]

EXAM:
NUCLEAR MEDICINE VENTILATION AND PERFUSION SCAN

Views: Anterior, posterior, LPO, LAO, RPO, RAO -ventilation and
perfusion

Radionuclide: Technetium 99m DTPA -ventilation; Technetium 99m
macroaggregated albumin-perfusion

Dose:  40.0 mCi-ventilation; 6.0 mCi-perfusion

Route of administration: Inhalation -ventilation ; intravenous
-perfusion
FINDINGS: On the ventilation study, radiotracer uptake is homogeneous and
symmetric bilaterally. There are no appreciable ventilation defects.

On the perfusion study, radiotracer uptake is homogeneous and
symmetric bilaterally. There are no appreciable perfusion defects.
IMPRESSION: Normal ventilation and perfusion lung scans. Very low probability of
pulmonary embolus.

## 2016-05-14 IMAGING — CT CT CHEST W/O CM
1 of 4 series · 4 of 36 positions shown, 5 images · non-contrast
Comparison: Chest radiograph December 25, 2014

CLINICAL DATA: Chest pressure and shortness of breath

EXAM:
CT CHEST WITHOUT CONTRAST
TECHNIQUE: Multidetector CT imaging of the chest was performed following the
standard protocol without IV contrast. Patient has allergy to
iodinated contrast material.

[Series 204: coronal · coronal · 0.45mm/px · 4 of 88 slices shown, 5 images]
[im 18/88  mediastinal]
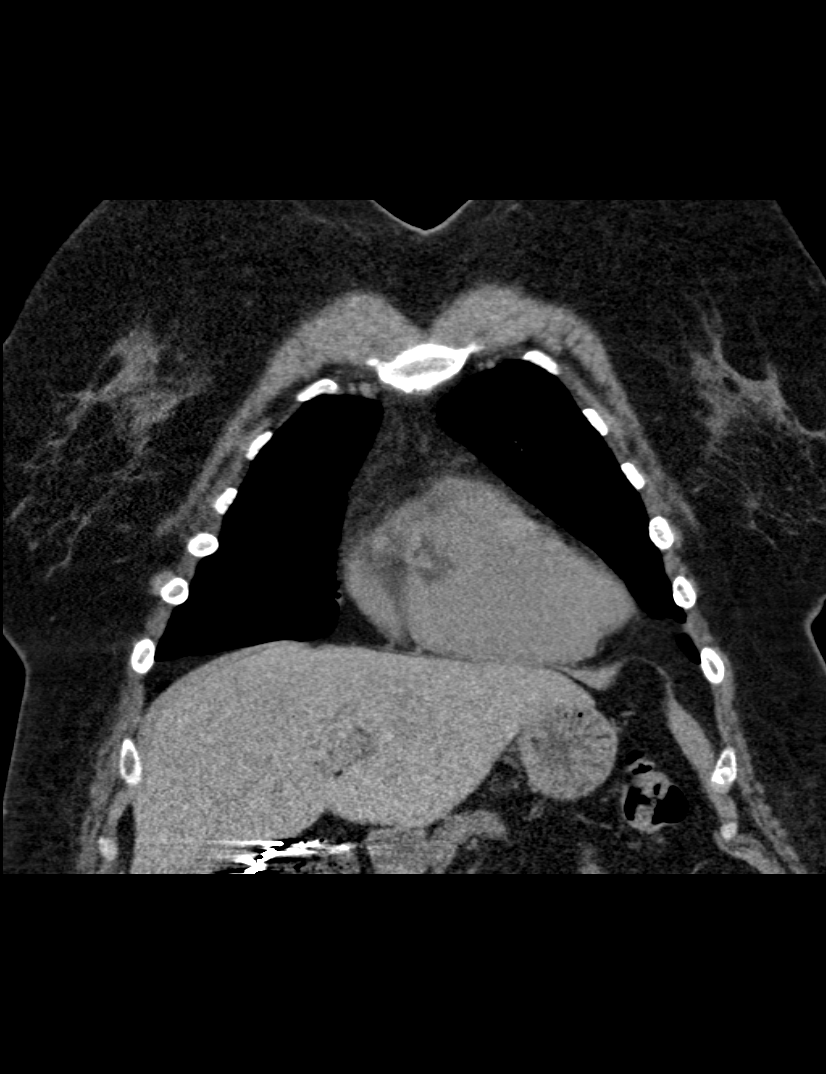
[im 18/88  lung]
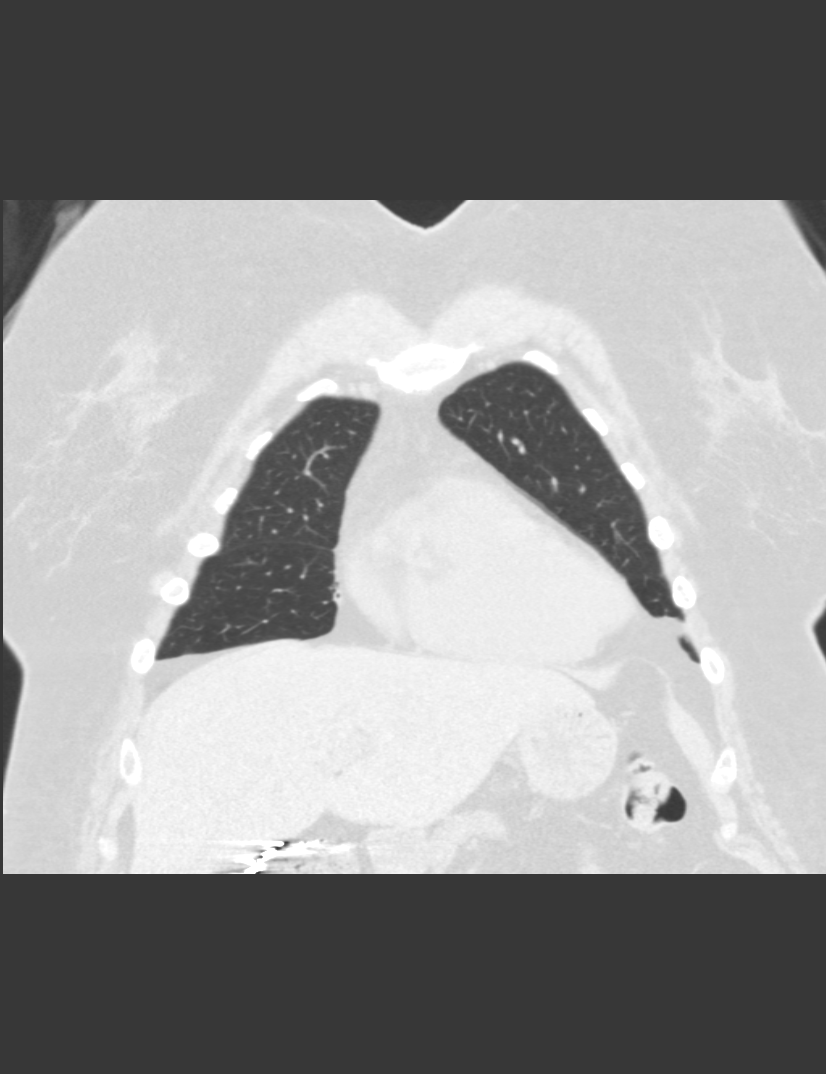
[im 35/88  lung]
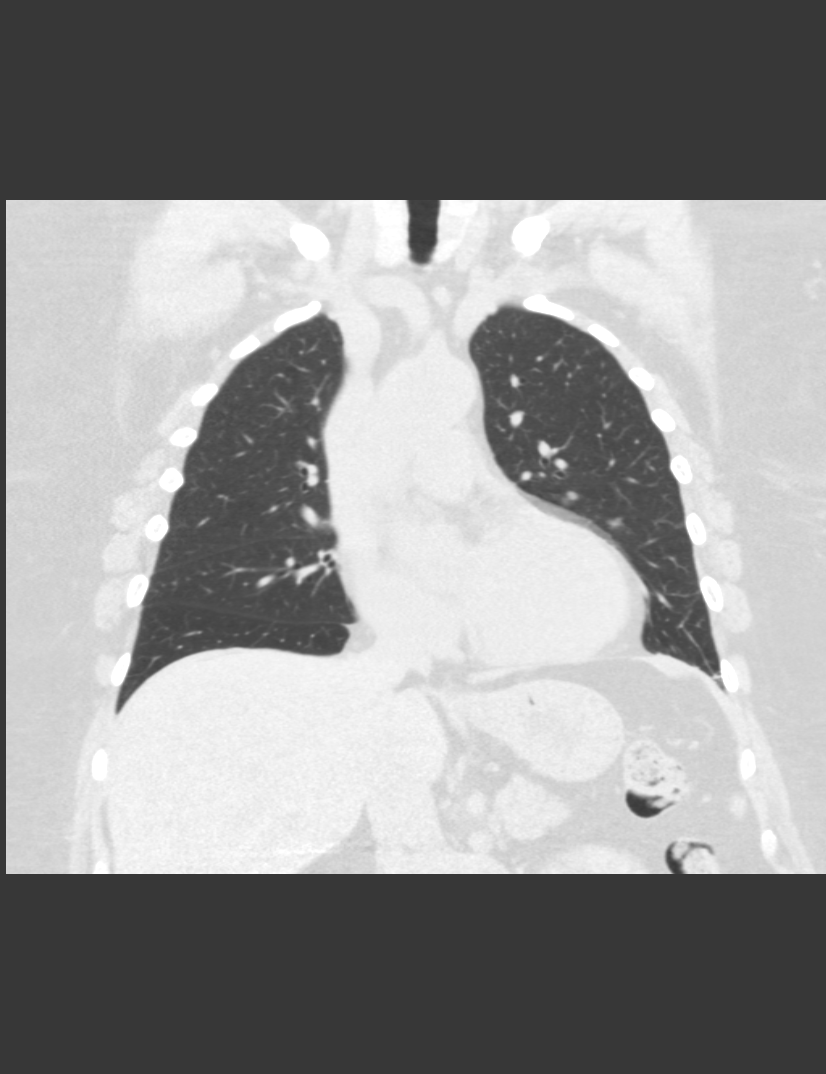
[im 53/88  lung]
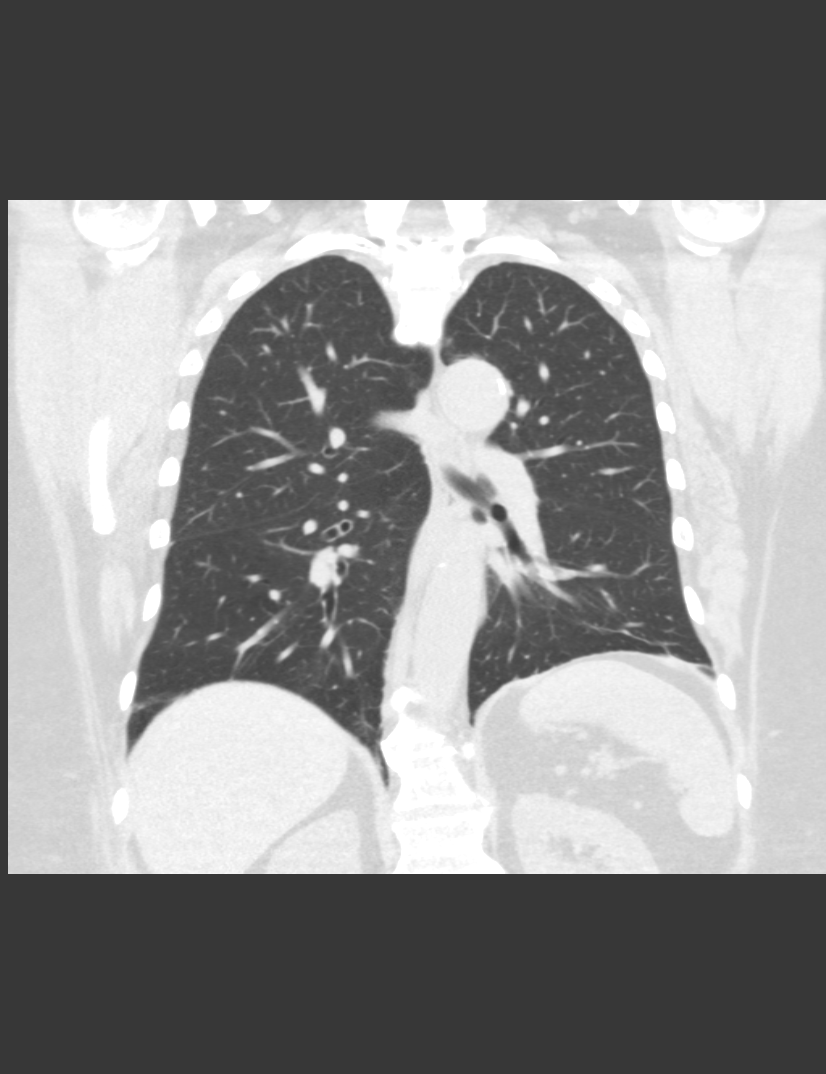
[im 70/88  lung]
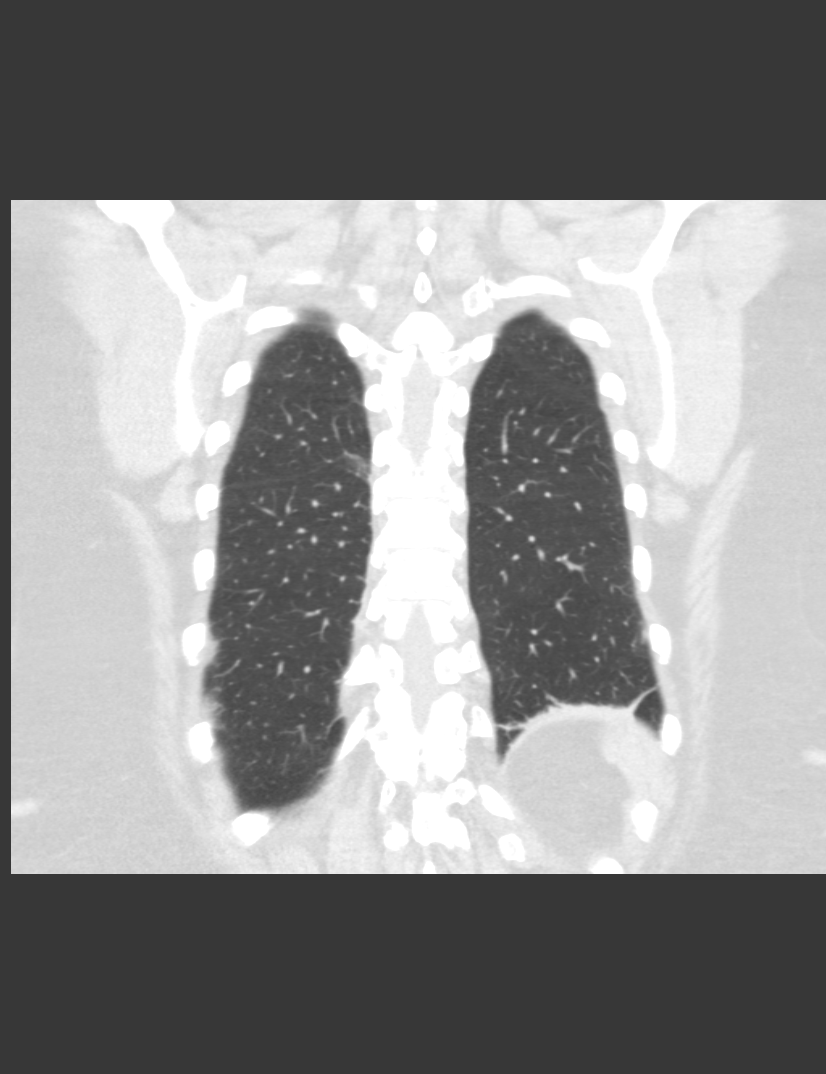

[4 of 36 positions shown; findings below may reference images not displayed]

FINDINGS: There is mild atelectatic change in the left base. There is no lung
edema or consolidation. No pneumothorax.

There is no demonstrable thoracic adenopathy. There is
atherosclerotic change in the aortic arch region. There is no
demonstrable thoracic aortic aneurysm. The pericardium is not
thickened. Visualized thyroid appears normal.

Visualized upper abdomen, there are surgical clips in the
gallbladder fossa region.

There are no blastic or lytic bone lesions. There is degenerative
change in the thoracic spine.

There is a subtle lucency in the manubrium which appears to
represent a fracture. This subtle fracture is best seen on sagittal
slice 63 series 205.
IMPRESSION: Apparent subtle nondisplaced fracture of the manubrium. Mild
atelectatic change in the left base. No edema or consolidation. No
adenopathy. Gallbladder absent.

## 2017-02-24 ENCOUNTER — Emergency Department (HOSPITAL_COMMUNITY): Payer: Self-pay

## 2017-02-24 ENCOUNTER — Encounter (HOSPITAL_COMMUNITY): Payer: Self-pay | Admitting: Emergency Medicine

## 2017-02-24 ENCOUNTER — Observation Stay (HOSPITAL_COMMUNITY)
Admission: EM | Admit: 2017-02-24 | Discharge: 2017-02-25 | Disposition: A | Discharge status: Home / Self Care | Payer: Self-pay | Attending: Cardiology | Admitting: Cardiology

## 2017-02-24 DIAGNOSIS — Z87891 Personal history of nicotine dependence: Secondary | ICD-10-CM | POA: Insufficient documentation

## 2017-02-24 DIAGNOSIS — Z791 Long term (current) use of non-steroidal anti-inflammatories (NSAID): Secondary | ICD-10-CM | POA: Insufficient documentation

## 2017-02-24 DIAGNOSIS — Z882 Allergy status to sulfonamides status: Secondary | ICD-10-CM | POA: Insufficient documentation

## 2017-02-24 DIAGNOSIS — Z888 Allergy status to other drugs, medicaments and biological substances status: Secondary | ICD-10-CM | POA: Insufficient documentation

## 2017-02-24 DIAGNOSIS — K219 Gastro-esophageal reflux disease without esophagitis: Secondary | ICD-10-CM | POA: Insufficient documentation

## 2017-02-24 DIAGNOSIS — I517 Cardiomegaly: Secondary | ICD-10-CM | POA: Insufficient documentation

## 2017-02-24 DIAGNOSIS — R079 Chest pain, unspecified: Secondary | ICD-10-CM | POA: Diagnosis present

## 2017-02-24 DIAGNOSIS — I10 Essential (primary) hypertension: Secondary | ICD-10-CM | POA: Insufficient documentation

## 2017-02-24 DIAGNOSIS — Z7982 Long term (current) use of aspirin: Secondary | ICD-10-CM | POA: Insufficient documentation

## 2017-02-24 DIAGNOSIS — R0789 Other chest pain: Principal | ICD-10-CM | POA: Insufficient documentation

## 2017-02-24 DIAGNOSIS — Z9119 Patient's noncompliance with other medical treatment and regimen: Secondary | ICD-10-CM | POA: Insufficient documentation

## 2017-02-24 LAB — BASIC METABOLIC PANEL
Anion gap: 9 (ref 5–15)
BUN: 11 mg/dL (ref 6–20)
CALCIUM: 9 mg/dL (ref 8.9–10.3)
CHLORIDE: 106 mmol/L (ref 101–111)
CO2: 25 mmol/L (ref 22–32)
Creatinine, Ser: 0.85 mg/dL (ref 0.44–1.00)
GFR calc Af Amer: >60 mL/min (ref >60)
GFR calc non Af Amer: >60 mL/min (ref >60)
Glucose, Bld: 89 mg/dL (ref 65–99)
Potassium: 3.8 mmol/L (ref 3.5–5.1)
Sodium: 140 mmol/L (ref 135–145)

## 2017-02-24 LAB — CBC
HCT: 41.3 % (ref 36.0–46.0)
HEMOGLOBIN: 12.8 g/dL (ref 12.0–15.0)
MCH: 26.6 pg (ref 26.0–34.0)
MCHC: 31 g/dL (ref 30.0–36.0)
MCV: 85.9 fL (ref 78.0–100.0)
Platelets: 258 10*3/uL (ref 150–400)
RBC: 4.81 MIL/uL (ref 3.87–5.11)
RDW: 14 % (ref 11.5–15.5)
WBC: 5.2 10*3/uL (ref 4.0–10.5)

## 2017-02-24 LAB — I-STAT TROPONIN, ED: TROPONIN I, POC: 0 ng/mL (ref 0.00–0.08)

## 2017-02-24 LAB — TROPONIN I: Troponin I: <0.03 ng/mL (ref <0.03)

## 2017-02-24 MED ORDER — HEPARIN SODIUM (PORCINE) 5000 UNIT/ML IJ SOLN
5000.0000 [IU] | Freq: Three times a day (TID) | INTRAMUSCULAR | Status: DC
  Administered 2017-02-24 – 2017-02-25 (×2): 5000 [IU] via SUBCUTANEOUS
  Filled 2017-02-24 (×2): qty 1

## 2017-02-24 MED ORDER — IRBESARTAN 150 MG PO TABS
150.0000 mg | ORAL_TABLET | Freq: Every day | ORAL | Status: DC
  Administered 2017-02-24: 150 mg via ORAL
  Filled 2017-02-24: qty 1

## 2017-02-24 MED ORDER — ONDANSETRON HCL 4 MG/2ML IJ SOLN
4.0000 mg | Freq: Four times a day (QID) | INTRAMUSCULAR | Status: DC | PRN
Start: 2017-02-24 — End: 2017-02-25

## 2017-02-24 MED ORDER — NITROGLYCERIN 0.4 MG SL SUBL
0.4000 mg | SUBLINGUAL_TABLET | SUBLINGUAL | Status: DC | PRN
  Administered 2017-02-25 (×4): 0.4 mg via SUBLINGUAL
  Filled 2017-02-24: qty 1

## 2017-02-24 MED ORDER — ACETAMINOPHEN 325 MG PO TABS
650.0000 mg | ORAL_TABLET | ORAL | Status: DC | PRN
  Administered 2017-02-24: 650 mg via ORAL
  Filled 2017-02-24: qty 2

## 2017-02-24 NOTE — ED Triage Notes (Signed)
Pt here from home with c/o chest pain , pt received 2 nitro and 324 mg of asa , b/p high with ems but down now with the nitro , pt still 8/10 chest pain

## 2017-02-24 NOTE — ED Provider Notes (Signed)
[facility] DEPT Provider Note   CSN: [id number] Arrival date & time: 02/24/17  1104     History   Chief Complaint Chief Complaint  Patient presents with  . Chest Pain    HPI Mayan Guedea is a 61 y.o. female.  HPI Patient presents with chest pain.began this morning. His intermittent chest and goes to her left neck and left arm. Has had pains like this before. She was seen in the hospital and was worked up for chest pain with echocardiogram around 9 months ago. Thought to be chest wall pain at that time. States she's not really had pains up until today. With further questioning states she does get short of breath with exertion has had less physical activity due to this. Was given nitroglycerin by EMS and had mild improvement of the pain. Blood pressure was elevated also. Has come down some nitroglycerin also. Has been off her medications due to financial reasons. Past Medical History:  Diagnosis Date  . Angina at rest [facility])   . Arthritis   . Asthma   . GERD (gastroesophageal reflux disease)   . Hypertension     Patient Active Problem List   Diagnosis Date Noted  . Chest pain 02/24/2017  . Atypical chest pain 05/10/2016  . HTN (hypertension) 05/10/2016  . Hypertensive emergency 05/10/2016  . Anxiety 05/10/2016  . Gastroesophageal reflux disease with esophagitis     Past Surgical History:  Procedure Laterality Date  . ABDOMINAL HYSTERECTOMY    . APPENDECTOMY    . CHOLECYSTECTOMY      OB History    No data available       Home Medications    Prior to Admission medications   Medication Sig Start Date End Date Taking? Authorizing Provider  acetaminophen (TYLENOL) 500 MG tablet Take 500 mg by mouth every 6 (six) hours as needed for mild pain.   Yes [provider]  albuterol (PROVENTIL HFA;VENTOLIN HFA) 108 (90 Base) MCG/ACT inhaler Inhale 2 puffs into the lungs every 6 (six) hours as needed for wheezing or shortness of breath.   Yes [provider]  acetaminophen (TYLENOL) 325 MG tablet Take 2 tablets (650 mg total) by mouth every 4 (four) hours as needed for headache or mild pain. 05/11/16   Elgergawy, Silver Huguenin, MD  aspirin EC 81 MG tablet Take 1 tablet (81 mg total) by mouth daily. 05/11/16   Elgergawy, Silver Huguenin, MD  hydrochlorothiazide (HYDRODIURIL) 25 MG tablet Take 1 tablet (25 mg total) by mouth daily. 05/12/16   Elgergawy, Silver Huguenin, MD  irbesartan (AVAPRO) 75 MG tablet Take 1 tablet (75 mg total) by mouth daily. 05/12/16   Elgergawy, Silver Huguenin, MD  omeprazole (PRILOSEC OTC) 20 MG tablet Take 20 mg by mouth daily as needed (for acid reflux).    [provider]    Family History Family History  Problem Relation Age of Onset  . Heart disease Mother        3 CABG, stents placements.   . Heart attack Mother        late 33s  . Cancer Mother   . Diabetes Mother   . Diabetes Other   . Heart disease Other     Social History Social History  Substance Use Topics  . Smoking status: Former Smoker    Packs/day: 3.00    Types: Cigarettes    Quit date: 02/27/2006  . Smokeless tobacco: Never Used  . Alcohol use No     Allergies  Fish allergy; Iodine; and Sulfur   Review of Systems Review of Systems  Constitutional: Negative for appetite change.  HENT: Negative for congestion.   Respiratory: Positive for shortness of breath.   Cardiovascular: Positive for chest pain. Negative for leg swelling.  Gastrointestinal: Negative for abdominal pain and blood in stool.  Genitourinary: Negative for dysuria.  Musculoskeletal: Negative for back pain.  Skin: Negative for rash.  Neurological: Negative for seizures.  Hematological: Negative for adenopathy.  Psychiatric/Behavioral: Negative for confusion.     Physical Exam Updated Vital Signs BP (!) 143/81   Pulse 81   Resp 16   SpO2 98%   Physical Exam  Constitutional: She appears well-developed.  Eyes: Pupils are equal, round, and reactive to light.    Cardiovascular: Normal rate.   No murmur heard. Pulmonary/Chest: She exhibits tenderness.  Tenderness to anterior mid chest wall.  Abdominal: Soft.  Musculoskeletal: Normal range of motion.  Neurological: She is alert.  Skin: Skin is warm. Capillary refill takes less than 2 seconds.  Psychiatric: She has a normal mood and affect.     ED Treatments / Results  Labs (all labs ordered are listed, but only abnormal results are displayed) Labs Reviewed  BASIC METABOLIC PANEL  CBC  HIV ANTIBODY (ROUTINE TESTING)  TROPONIN I  I-STAT TROPONIN, ED    EKG  EKG Interpretation None       Radiology Dg Chest 2 View  Result Date: 02/24/2017 CLINICAL DATA:  Mid and left-sided stabbing chest pain and pressure sensation with pain radiating into the jaw all and left arm. Symptoms began today. Former smoker. History of asthma and hypertension an angina pectoris. EXAM: CHEST  2 VIEW COMPARISON:  Chest x-ray and May 10, 2016 FINDINGS: The lungs are adequately inflated. There is no focal infiltrate. There is no pleural effusion. The heart is mildly enlarged but stable. The pulmonary vascularity is normal. The mediastinum is normal in width. There is calcification in the wall of the aortic arch. There is no pleural effusion or pneumothorax. 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 IMPRESSION: Mild chronic bronchitic-smoking related interstitial changes. Stable cardiomegaly without pulmonary edema. Thoracic aortic atherosclerosis. Electronically Signed   By: David  Martinique M.D.   On: 02/24/2017 11:51    Procedures Procedures (including critical care time)  Medications Ordered in ED Medications  acetaminophen (TYLENOL) tablet 650 mg (not administered)  ondansetron (ZOFRAN) injection 4 mg (not administered)  heparin injection 5,000 Units (not administered)  irbesartan (AVAPRO) tablet 150 mg (not administered)     Initial Impression / Assessment and Plan / ED  Course  I have reviewed the triage vital signs and the nursing notes.  Pertinent labs & imaging results that were available during my care of the patient were reviewed by me and considered in my medical decision making (see chart for details).     Patient with chest pain. Both typical and atypical components. Admit to cardiology. EKG reassuring. Enzymes negative. However does have some continued pain.  Final Clinical Impressions(s) / ED Diagnoses   Final diagnoses:  Chest pain, unspecified type    New Prescriptions New Prescriptions   No medications on file     Davonna Belling, MD 02/24/17 1344

## 2017-02-24 NOTE — Progress Notes (Signed)
Admit to telemetry. Serial EKG, troponin.  Nigel Mormon, MD 02/19/2017, 1:29 PM [facility] Pager: [phone] Office: [phone] If no answer Cell [phone]

## 2017-02-24 NOTE — ED Notes (Signed)
Patient to xray.

## 2017-02-24 NOTE — ED Notes (Signed)
Diet tray ordered 

## 2017-02-24 NOTE — ED Notes (Signed)
Pt assisted up into bedside chair for comfort.

## 2017-02-24 NOTE — H&P (Addendum)
Kristi [name] is an 61 y.o. female.    Chief Complaint: Chest pain  HPI:  Kristi [name]  is a 61 y.o. female  With uncontrolled hypertension, GERD, previously normal stress test in 2013, preserved EF on echocardiogram 05/2016, was brought to the ED with episode of chest pain.  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 Pain was retrosternal, described as tightness, radiated to her jaw and neck and left arm. It lasted at 10/10 for 2 min, then reduced to 8/10, and further relieved by 2 SL NTG given by EMS. She still has 2/10 pain. EKG is normal with no ischemic changes. First trop is normal.   She is uninsured and hasn't seen any physician in a long time. She does not take any blood pressure medications due to cost. BP in the ED is 150-170/90-120. She does endorse exertional chest pain relieved with rest at baseline.  Past Medical History:  Diagnosis Date  . Angina at rest [facility])   . Arthritis   . Asthma   . GERD (gastroesophageal reflux disease)   . Hypertension     Past Surgical History:  Procedure Laterality Date  . ABDOMINAL HYSTERECTOMY    . APPENDECTOMY    . CHOLECYSTECTOMY      Family History  Problem Relation Age of Onset  . Heart disease Mother        3 CABG, stents placements.   . Heart attack Mother        late 47s  . Cancer Mother   . Diabetes Mother   . Diabetes Other   . Heart disease Other    Social History:  reports that she quit smoking about 11 years ago. Her smoking use included Cigarettes. She smoked 3.00 packs per day. She has never used smokeless tobacco. She reports that she does not drink alcohol or use drugs.  Allergies:  Allergies  Allergen Reactions  . Fish Allergy Anaphylaxis  . Iodine Anaphylaxis  . Sulfur Hives     (Not in a hospital admission)  Results for orders placed or performed during the hospital encounter of 02/24/17 (from the past 48 hour(s))  Basic metabolic panel     Status: None   Collection Time:  02/24/17 11:22 AM  Result Value Ref Range   Sodium 140 135 - 145 mmol/L   Potassium 3.8 3.5 - 5.1 mmol/L   Chloride 106 101 - 111 mmol/L   CO2 25 22 - 32 mmol/L   Glucose, Bld 89 65 - 99 mg/dL   BUN 11 6 - 20 mg/dL   Creatinine, Ser 0.85 0.44 - 1.00 mg/dL   Calcium 9.0 8.9 - 10.3 mg/dL   GFR calc non Af Amer >60 >60 mL/min   GFR calc Af Amer >60 >60 mL/min    Comment: (NOTE) The eGFR has been calculated using the CKD EPI equation. This calculation has not been validated in all clinical situations. eGFR's persistently <60 mL/min signify possible Chronic Kidney Disease.    Anion gap 9 5 - 15  CBC     Status: None   Collection Time: 02/24/17 11:22 AM  Result Value Ref Range   WBC 5.2 4.0 - 10.5 K/uL   RBC 4.81 3.87 - 5.11 MIL/uL   Hemoglobin 12.8 12.0 - 15.0 g/dL   HCT 41.3 36.0 - 46.0 %   MCV 85.9 78.0 - 100.0 fL   MCH 26.6 26.0 - 34.0 pg   MCHC 31.0 30.0 - 36.0 g/dL   RDW 14.0 11.5 -  15.5 %   Platelets 258 150 - 400 K/uL  I-stat troponin, ED     Status: None   Collection Time: 02/24/17 11:46 AM  Result Value Ref Range   Troponin i, poc 0.00 0.00 - 0.08 ng/mL   Comment 3            Comment: Due to the release kinetics of cTnI, a negative result within the first hours of the onset of symptoms does not rule out myocardial infarction with certainty. If myocardial infarction is still suspected, repeat the test at appropriate intervals.    Dg Chest 2 View  Result Date: 02/24/2017 CLINICAL DATA:  Mid and left-sided stabbing chest pain and pressure sensation with pain radiating into the jaw all and left arm. Symptoms began today. Former smoker. History of asthma and hypertension an angina pectoris. EXAM: CHEST  2 VIEW COMPARISON:  Chest x-ray and May 10, 2016 FINDINGS: The lungs are adequately inflated. There is no focal infiltrate. There is no pleural effusion. The heart is mildly enlarged but stable. The pulmonary vascularity is normal. The mediastinum is normal in width.  There is calcification in the wall of the aortic arch. There is no pleural effusion or pneumothorax. 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 IMPRESSION: Mild chronic bronchitic-smoking related interstitial changes. Stable cardiomegaly without pulmonary edema. Thoracic aortic atherosclerosis. Electronically Signed   By: David  Martinique M.D.   On: 02/24/2017 11:51    Review of Systems  Constitutional: Negative for chills, fever and malaise/fatigue.  HENT: Negative.   Respiratory: Positive for shortness of breath. Negative for cough.   Cardiovascular: Positive for chest pain. Negative for palpitations, leg swelling and PND.  Gastrointestinal: Positive for nausea. Negative for diarrhea.  Genitourinary: Negative.   Musculoskeletal: Positive for joint pain.  Skin: Negative for rash.  Neurological: Positive for headaches. Negative for dizziness.  Endo/Heme/Allergies: Does not bruise/bleed easily.  All other systems reviewed and are negative.   Blood pressure (!) 157/97, pulse 83, resp. rate 19, SpO2 99 %. Physical Exam  Nursing note and vitals reviewed. Constitutional: She is oriented to person, place, and time. She appears well-developed and well-nourished.  HENT:  Head: Normocephalic and atraumatic.  Neck: Neck supple. No JVD present.  Cardiovascular: Normal rate and regular rhythm.   Murmur (1/6 systoluc mirmur at aortic area) heard. Respiratory: Effort normal and breath sounds normal. No respiratory distress. She has no wheezes. She has no rales.  Chest wall tenderness. States this pain is different than the pain that brought her to ED   GI: Soft. Bowel sounds are normal. There is no tenderness. There is no rebound.  Musculoskeletal: She exhibits no edema.  Neurological: She is alert and oriented to person, place, and time. She has normal reflexes.  Skin: Skin is warm and dry.  Psychiatric: She has a normal mood and affect.      Assessment/Plan  61 year old Caucasian female admitted with chest pain. Uncontrolled hypertension, noncompliance due to financial reasons, GERD.  Chest pain: Normal EKG. First troponin negative. Heart score 4. Admit to telemetry. Repeat serial EKG and one more troponin. Plan to perform exercise nuclear stress stress 8/24 AM Cardiac diet today. Nothing by mouth after midnight.  Hypertension: Uncontrolled. Would like to discharge and Benicar HCTZ 40/12.5. Start irbesartan 150 mg in the hospital.  Nigel Mormon, MD 02/24/2017, 12:40 PM

## 2017-02-25 ENCOUNTER — Observation Stay (HOSPITAL_COMMUNITY): Payer: Self-pay

## 2017-02-25 LAB — LIPID PANEL
Cholesterol: 203 mg/dL — ABNORMAL HIGH (ref 0–200)
HDL: 45 mg/dL (ref >40)
LDL CALC: 132 mg/dL — AB (ref 0–99)
TRIGLYCERIDES: 129 mg/dL (ref <150)
Total CHOL/HDL Ratio: 4.5 RATIO
VLDL: 26 mg/dL (ref 0–40)

## 2017-02-25 LAB — D-DIMER, QUANTITATIVE (NOT AT ARMC): D DIMER QUANT: 0.98 ug{FEU}/mL — AB (ref 0.00–0.50)

## 2017-02-25 LAB — HIV ANTIBODY (ROUTINE TESTING W REFLEX): HIV Screen 4th Generation wRfx: NONREACTIVE

## 2017-02-25 MED ORDER — LISINOPRIL-HYDROCHLOROTHIAZIDE 20-12.5 MG PO TABS: 1.0000 | ORAL_TABLET | Freq: Every day | ORAL | 3 refills | Status: DC

## 2017-02-25 MED ORDER — REGADENOSON 0.4 MG/5ML IV SOLN
0.4000 mg | Freq: Once | INTRAVENOUS | Status: AC
  Administered 2017-02-25: 0.4 mg via INTRAVENOUS
  Filled 2017-02-25: qty 5

## 2017-02-25 MED ORDER — LISINOPRIL 10 MG PO TABS: 10.0000 mg | ORAL_TABLET | Freq: Every day | ORAL | Status: DC

## 2017-02-25 MED ORDER — NITROGLYCERIN 0.4 MG SL SUBL
SUBLINGUAL_TABLET | SUBLINGUAL | Status: AC
  Administered 2017-02-25: 0.4 mg via SUBLINGUAL
  Filled 2017-02-25: qty 1

## 2017-02-25 MED ORDER — REGADENOSON 0.4 MG/5ML IV SOLN
INTRAVENOUS | Status: AC
  Administered 2017-02-25: 0.4 mg via INTRAVENOUS
  Filled 2017-02-25: qty 5

## 2017-02-25 MED ORDER — TECHNETIUM TC 99M TETROFOSMIN IV KIT
30.0000 | PACK | Freq: Once | INTRAVENOUS | Status: AC | PRN
  Administered 2017-02-25: 30 via INTRAVENOUS

## 2017-02-25 NOTE — Progress Notes (Signed)
Pt c/o 3/10 CP. Pt refusing nitro at this time, but will call RN if CP becomes 5/10. Call bell and phone within reach. Will continue to monitor.

## 2017-02-25 NOTE — Discharge Summary (Signed)
Physician Discharge Summary  Patient ID: Kristi [name] MRN: [id number] DOB/AGE: 1955-09-25 61 y.o.  Admit date: 02/24/2017 Discharge date: 02/25/2017  Admission Diagnoses: Chest pain Hypertension  Discharge Diagnoses:  Noncardiac chest pain, likely musculoskeletal  Discharged Condition: Stable  Hospital Course:  This Markov was admitted to the hospital on 02/24/2017 with complaints of chest pain. On admission her blood pressure was elevated to 190/100. EKG was normal. Myocardial infarction was ruled out with serial troponin X2. Chest x-ray did not show any widening of mediastinum. D-dimer is mildly elevated at 0.98, however lower than previous check in 2016 when she underwent VQ scan which was low probability for pulmonary embolism. Patient stayed stable through the hospital stay without any hypoxia.  On 02/26/2017, she underwent pharmacological stress portion of her SPECT MPI. She did have constant at the time of imaging. Stress portion showed normal perfusion with no ischemia or infarction. In view of normal stress exam, rest exam is not necessary.   She was discharged on combination lisinopril HCTZ 20-12 0.5 mg which is available on $4 formulary. I will see patient for transition of care visit on September 7 at 8:30 AM. At that visit, I will check basic metabolic panel. I will also make referral to get sleep study given her suspected obstructive sleep apnea. I have encouraged her to walk. I have asked her to take Tylenol for musculoskeletal pain.  Consults: None  Significant Diagnostic Studies:  CXR 02/24/2017 EXAM: CHEST  2 VIEW  COMPARISON:  Chest x-ray and May 10, 2016  FINDINGS: The lungs are adequately inflated. There is no focal infiltrate. There is no pleural effusion. The heart is mildly enlarged but stable. The pulmonary vascularity is normal. The mediastinum is normal in width. There is calcification in the wall of the aortic arch. There is no pleural effusion  or pneumothorax. 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  IMPRESSION: Mild chronic bronchitic-smoking related interstitial changes. Stable cardiomegaly without pulmonary edema.  Stress SPECT MPI: FINDINGS: Perfusion: No defects are noted to suggest ischemia or infarction.  Wall Motion: Normal left ventricular wall motion. No left ventricular dilation.  Left Ventricular Ejection Fraction: 59 %  End diastolic volume 94 ml  End systolic volume 38 ml  IMPRESSION: 1. No evidence for ischemia or infarction.  2. Normal left ventricular wall motion.  3. Left ventricular ejection fraction 59%  4. Non invasive risk stratification*: Low   Treatments: cardiac meds: Irbesartan  Discharge Exam: Vitals:   02/25/17 1023 02/25/17 1026 02/25/17 1027 02/25/17 1328  BP: (!) 145/83 (!) 146/88  (!) 159/85  Pulse: 76 79 84 77  Resp:    17  Temp:    98.3 F (36.8 C)  TempSrc:    Oral  SpO2:    97%  Weight:      Height:         Physical Exam Nursing noteand vitalsreviewed. Constitutional: She is oriented to person, place, and time. She appears well-developedand well-nourished.  HENT:  Head: Normocephalicand atraumatic.  Neck: Neck supple. No JVDpresent.  Cardiovascular: Normal rateand regular rhythm.  Murmur(1/6 systoluc murmur at aortic area)heard. Respiratory: Effort normaland breath sounds normal. No respiratory distress. She has no wheezes. She has no rales.  Chest wall tenderness, improved.   Disposition: 01-Home or Self Care  Discharge Instructions    Diet - low sodium heart healthy    Complete by:  As directed    Increase activity slowly    Complete by:  As directed  Allergies as of 02/25/2017      Reactions   Fish Allergy Anaphylaxis   Iodine Anaphylaxis   Sulfur Hives      Medication List    STOP taking these medications   hydrochlorothiazide 25 MG tablet Commonly known as:  HYDRODIURIL   irbesartan  75 MG tablet Commonly known as:  AVAPRO     TAKE these medications   acetaminophen 325 MG tablet Commonly known as:  TYLENOL Take 2 tablets (650 mg total) by mouth every 4 (four) hours as needed for headache or mild pain. What changed:  Another medication with the same name was removed. Continue taking this medication, and follow the directions you see here.   albuterol 108 (90 Base) MCG/ACT inhaler Commonly known as:  PROVENTIL HFA;VENTOLIN HFA Inhale 2 puffs into the lungs every 6 (six) hours as needed for wheezing or shortness of breath.   aspirin EC 81 MG tablet Take 1 tablet (81 mg total) by mouth daily.   lisinopril-hydrochlorothiazide 20-12.5 MG tablet Commonly known as:  ZESTORETIC Take 1 tablet by mouth daily.   omeprazole 20 MG tablet Commonly known as:  PRILOSEC OTC Take 20 mg by mouth daily as needed (for acid reflux).            Discharge Care Instructions        Start     Ordered   02/25/17 0000  Increase activity slowly     02/25/17 1613   02/25/17 0000  Diet - low sodium heart healthy     02/25/17 1613   02/25/17 0000  lisinopril-hydrochlorothiazide (ZESTORETIC) 20-12.5 MG tablet  Daily     02/25/17 1613     Follow-up Information    Taysen Bushart, Reynold Bowen, MD Follow up on 03/11/2017.   Specialty:  Cardiology Why:  8:30 AM Contact information: [address] [place] [address] [place] Follow up on 03/09/2017.   Why:  @ 10:00 with Molli Barrows for hospital follow up visit.  Contact information: [place] [id number]       [facility] Follow up.   Why:  Pt will be able to utilize the pharmacy at this location monday- Friday and cost ranges from $4.00-$10.00.  Contact information: [place] [phone]          Signed: Nigel Mormon 02/25/2017, 5:17 PM

## 2017-02-25 NOTE — Care Management Note (Signed)
Case Management Note  Patient Details  Name: Kristi [name] MRN: [id number] Date of Birth: 10/25/1955  Subjective/Objective: Pt presented for Chest Pain- 2 days stress test. Pt is currently working, however she has no insurance. She was seeing Nolene Ebbs, MD, however has not seen in 2 years 2/2 cost of visits. Pt is without a PCP at this time. CM did call to the [facility] to request Hospital follow Up for Wednesdays per pt request.                  Action/Plan: Hospital F/u scheduled and placed on the AVS- Pt will be able to utilize the Pharmacy at the [facility] Monday- Friday. Pt to be d/c over the weekend. Pt uses the local Pharmacy [facility] [employer] on [address]- Rx will need to be sent to this location. Per pt her son will help her with cost of medications. Pt states she uses the bus for transportation needs. Pt states at d/c she will utilize [address]. No further needs from CM at this time.    Expected Discharge Date:                  Expected Discharge Plan:  Home/Self Care  In-House Referral:  NA  Discharge planning Services  CM Consult, Medication Assistance, Follow-up appt scheduled, [place] Acute Care Choice:  NA Choice offered to:  NA  DME Arranged:  N/A DME Agency:  NA  HH Arranged:  NA HH Agency:  NA  Status of Service:  Completed, signed off  If discussed at [place] of Stay Meetings, dates discussed:    Additional Comments:  Bethena Roys, RN 02/25/2017, 1:33 PM

## 2017-02-25 NOTE — Progress Notes (Signed)
Stress test today. Discharge if normal  Nigel Mormon, MD 02/19/2017, 1:29 PM [facility] Pager: [phone] Office: [phone] If no answer Cell [phone]

## 2017-02-25 NOTE — Progress Notes (Addendum)
Nitrate responsive chest pain with <30 sec of Bruce protocol, along with dizziness with no EKG changes. Patient has had constant pain through the night without any EKG changes and negative troponin. Will proceed with lexiscan and SPECT imaging.  CXR with normal mediastinum, no AI murmur make dissection unlikely Low risk for PE. Will check d-Dimer  Nigel Mormon, MD 02/19/2017, 1:29 PM [facility] Pager: [phone] Office: [phone] If no answer Cell [phone]

## 2017-02-25 NOTE — Progress Notes (Signed)
Subjective:  Feels better this morning Multiple episodes of brief sleep apnea at night  Objective:  Vital Signs in the last 24 hours: Temp:  [97.8 F (36.6 C)-98.4 F (36.9 C)] 97.9 F (36.6 C) (08/24 0747) Pulse Rate:  [58-112] 72 (08/24 0747) Resp:  [13-36] 13 (08/24 0747) BP: (97-171)/(46-100) 155/88 (08/24 0747) SpO2:  [85 %-100 %] 99 % (08/24 0747) Weight:  [133.1 kg (293 lb 6.9 oz)] 133.1 kg (293 lb 6.9 oz) (08/24 0149)   Review of Systems  Constitutional: Negative for chills, fever and malaise/fatigue.  HENT: Negative.   Respiratory: Negative for shortness of breath. Negative for cough.   Cardiovascular: Negative for chest pain. Negative for palpitations, leg swelling and PND.  Gastrointestinal: negative for nausea. Negative for diarrhea.  Genitourinary: Negative.   Musculoskeletal: Positive for joint pain.  Skin: Negative for rash.  Neurological: Positive for headaches, improved. Negative for dizziness.  Endo/Heme/Allergies: Does not bruise/bleed easily.  All other systems reviewed and are negative.   Blood pressure (!) 157/97, pulse 83, resp. rate 19, SpO2 99 %. Physical Exam  Nursing note and vitals reviewed. Constitutional: She is oriented to person, place, and time. She appears well-developed and well-nourished.  HENT:  Head: Normocephalic and atraumatic.  Neck: Neck supple. No JVD present.  Cardiovascular: Normal rate and regular rhythm.   Murmur (1/6 systoluc murmur at aortic area) heard. Respiratory: Effort normal and breath sounds normal. No respiratory distress. She has no wheezes. She has no rales.  Chest wall tenderness, improved.  Lab Results:  Recent Labs  02/24/17 1122  WBC 5.2  HGB 12.8  PLT 258    Recent Labs  02/24/17 1122  NA 140  K 3.8  CL 106  CO2 25  GLUCOSE 89  BUN 11  CREATININE 0.85    Recent Labs  02/24/17 1657  TROPONINI <0.03   Hepatic Function Panel No results for input(s): PROT, ALBUMIN, AST, ALT, ALKPHOS,  BILITOT, BILIDIR, IBILI in the last 72 hours.  Recent Labs  02/25/17 0245  CHOL 203*   No results for input(s): PROTIME in the last 72 hours.  Imaging: Chest Xray 02/24/2017 FINDINGS: The lungs are adequately inflated. There is no focal infiltrate. There is no pleural effusion. The heart is mildly enlarged but stable. The pulmonary vascularity is normal. The mediastinum is normal in width. There is calcification in the wall of the aortic arch. There is no pleural effusion or pneumothorax. 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  IMPRESSION: Mild chronic bronchitic-smoking related interstitial changes. Stable cardiomegaly without pulmonary edema.  Thoracic aortic atherosclerosis.  Cardiac Studies: 2 day nuclear stress test today  Assessment/Plan:  61 year old Caucasian female admitted with chest pain. Uncontrolled hypertension, noncompliance due to financial reasons, GERD.  Chest pain: Normal EKG. Two troponins negative. Heart score 4. Admitted to telemetry.  Two day protocol nuclear stress stress 8/24, 8/25   Hypertension: Uncontrolled. Will start on lisinopril-HCTZ 20-12.5 on discharge, available on $4 formulary Patient does not have insurance  Suspected obstructive sleep apnea: Recommend sleep study on discharge Counseled about weight loss.   LOS: 0 days    Monti Villers J Massai Hankerson 02/25/2017, 8:23 AM

## 2017-02-25 NOTE — Progress Notes (Signed)
Pt admitted from ED with chest pain, alert and oriented with a pain rating of 4, pt settled in bed with call light at bedside, hooked up on telemetry and verified accordingly, was reassured and will continue to monitor. Obasogie-Asidi, Claudia Alvizo Efe

## 2017-02-25 NOTE — ED Notes (Signed)
Cardiology notified about pt having cp, per cardiology pt is been r/o and is not cardiac related, pt pending to have a stress test done, no change done on telemetry bed.

## 2017-02-25 NOTE — ED Notes (Signed)
Cardiology

## 2017-03-09 ENCOUNTER — Encounter: Payer: Self-pay | Admitting: Family Medicine

## 2017-03-09 ENCOUNTER — Ambulatory Visit (INDEPENDENT_AMBULATORY_CARE_PROVIDER_SITE_OTHER): Payer: Self-pay | Admitting: Family Medicine

## 2017-03-09 VITALS — BP 138/88 | HR 94 | Temp 98.3°F | Resp 16 | Ht 65.0 in | Wt 291.0 lb

## 2017-03-09 DIAGNOSIS — F419 Anxiety disorder, unspecified: Secondary | ICD-10-CM

## 2017-03-09 DIAGNOSIS — Z1329 Encounter for screening for other suspected endocrine disorder: Secondary | ICD-10-CM

## 2017-03-09 DIAGNOSIS — R079 Chest pain, unspecified: Secondary | ICD-10-CM

## 2017-03-09 DIAGNOSIS — Z131 Encounter for screening for diabetes mellitus: Secondary | ICD-10-CM

## 2017-03-09 DIAGNOSIS — F329 Major depressive disorder, single episode, unspecified: Secondary | ICD-10-CM

## 2017-03-09 DIAGNOSIS — G8929 Other chronic pain: Secondary | ICD-10-CM

## 2017-03-09 DIAGNOSIS — I10 Essential (primary) hypertension: Secondary | ICD-10-CM

## 2017-03-09 DIAGNOSIS — Z23 Encounter for immunization: Secondary | ICD-10-CM

## 2017-03-09 DIAGNOSIS — F32A Depression, unspecified: Secondary | ICD-10-CM

## 2017-03-09 LAB — POCT URINALYSIS DIP (DEVICE)
BILIRUBIN URINE: NEGATIVE
Glucose, UA: NEGATIVE mg/dL
KETONES UR: NEGATIVE mg/dL
Leukocytes, UA: NEGATIVE
Nitrite: NEGATIVE
PH: 6.5 (ref 5.0–8.0)
Protein, ur: NEGATIVE mg/dL
SPECIFIC GRAVITY, URINE: 1.01 (ref 1.005–1.030)
Urobilinogen, UA: 0.2 mg/dL (ref 0.0–1.0)

## 2017-03-09 LAB — POCT GLYCOSYLATED HEMOGLOBIN (HGB A1C): HEMOGLOBIN A1C: 5.5

## 2017-03-09 MED ORDER — ESCITALOPRAM OXALATE 10 MG PO TABS: 10.0000 mg | ORAL_TABLET | Freq: Every day | ORAL | 2 refills | Status: DC

## 2017-03-09 MED ORDER — MELOXICAM 15 MG PO TABS: 15.0000 mg | ORAL_TABLET | Freq: Every day | ORAL | 2 refills | Status: DC

## 2017-03-09 NOTE — Patient Instructions (Addendum)
Reach out to  [facility] [facility]. [address], [place], [place] and   [facility], [place], [place], to schedule counseling services.   You are not a diabetic, your A1C today is 5.5.   Preventing Type 2 Diabetes Mellitus Type 2 diabetes (type 2 diabetes mellitus) is a long-term (chronic) disease that affects blood sugar (glucose) levels. Normally, a hormone called insulin allows glucose to enter cells in the body. The cells use glucose for energy. In type 2 diabetes, one or both of these problems may be present:  The body does not make enough insulin.  The body does not respond properly to insulin that it makes (insulin resistance).  Insulin resistance or lack of insulin causes excess glucose to build up in the blood instead of going into cells. As a result, high blood glucose (hyperglycemia) develops, which can cause many complications. Being overweight or obese and having an inactive (sedentary) lifestyle can increase your risk for diabetes. Type 2 diabetes can be delayed or prevented by making certain nutrition and lifestyle changes. What nutrition changes can be made?  Eat healthy meals and snacks regularly. Keep a healthy snack with you for when you get hungry between meals, such as fruit or a handful of nuts.  Eat lean meats and proteins that are low in saturated fats, such as chicken, fish, egg whites, and beans. Avoid processed meats.  Eat plenty of fruits and vegetables and plenty of grains that have not been processed (whole grains). It is recommended that you eat: ? 1?2 cups of fruit every day. ? 2?3 cups of vegetables every day. ? 6?8 oz of whole grains every day, such as oats, whole wheat, bulgur, brown rice, quinoa, and millet.  Eat low-fat dairy products, such as milk, yogurt, and cheese.  Eat foods that contain healthy fats, such as nuts, avocado, olive oil, and canola oil.  Drink water throughout the day.  Avoid drinks that contain added sugar, such as soda or sweet tea.  Follow instructions from your health care provider about specific eating or drinking restrictions.  Control how much food you eat at a time (portion size). ? Check food labels to find out the serving sizes of foods. ? Use a kitchen scale to weigh amounts of foods.  Saute or steam food instead of frying it. Cook with water or broth instead of oils or butter.  Limit your intake of: ? Salt (sodium). Have no more than 1 tsp (2,400 mg) of sodium a day. If you have heart disease or high blood pressure, have less than ? tsp (1,500 mg) of sodium a day. ? Saturated fat. This is fat that is solid at room temperature, such as butter or fat on meat. What lifestyle changes can be made?  Activity  Do moderate-intensity physical activity for at least 30 minutes on at least 5 days of the week, or as much as told by your health care provider.  Ask your health care provider what activities are safe for you. A mix of physical activities may be best, such as walking, swimming, cycling, and strength training.  Try to add physical activity into your day. For example: ? Park in spots that are farther away than usual, so that you walk more. For example, park in a far corner of the parking lot when you go to the office or the grocery store. ? Take a walk during your lunch break. ? Use stairs instead of elevators or escalators. Weight Loss  Lose weight as directed. Your health care provider can determine how much weight loss is best for you and can help you lose weight safely.  If you are overweight or obese, you may be instructed to lose at least 5?7 % of your body weight. Alcohol and Tobacco   Limit alcohol intake to no more than 1 drink a day for nonpregnant women and 2 drinks a day for men. One drink equals 12 oz of beer, 5 oz of wine, or 1 oz of hard liquor.  Do not use any tobacco products, such as cigarettes, chewing tobacco, and  e-cigarettes. If you need help quitting, ask your health care provider. Work With [place] Provider  Have your blood glucose tested regularly, as told by your health care provider.  Discuss your risk factors and how you can reduce your risk for diabetes.  Get screening tests as told by your health care provider. You may have screening tests regularly, especially if you have certain risk factors for type 2 diabetes.  Make an appointment with a diet and nutrition specialist (registered dietitian). A registered dietitian can help you make a healthy eating plan and can help you understand portion sizes and food labels. Why are these changes important?  It is possible to prevent or delay type 2 diabetes and related health problems by making lifestyle and nutrition changes.  It can be difficult to recognize signs of type 2 diabetes. The best way to avoid possible damage to your body is to take actions to prevent the disease before you develop symptoms. What can happen if changes are not made?  Your blood glucose levels may keep increasing. Having high blood glucose for a long time is dangerous. Too much glucose in your blood can damage your blood vessels, heart, kidneys, nerves, and eyes.  You may develop prediabetes or type 2 diabetes. Type 2 diabetes can lead to many chronic health problems and complications, such as: ? Heart disease. ? Stroke. ? Blindness. ? Kidney disease. ? Depression. ? Poor circulation in the feet and legs, which could lead to surgical removal (amputation) in severe cases. Where to find support:  Ask your health care provider to recommend a registered dietitian, diabetes educator, or weight loss program.  Look for local or online weight loss groups.  Join a gym, fitness club, or outdoor activity group, such as a walking club. Where to find more information: To learn more about diabetes and diabetes prevention, visit:  American Diabetes Association (ADA):  www.diabetes.[employer] of Diabetes and Digestive and Kidney Diseases: FindSpin.nl  To learn more about healthy eating, visit:  The U.S. Department of Agriculture Scientist, research (physical sciences)), Choose My Plate: http://wiley-williams.com/  Office of Disease Prevention and Health Promotion (ODPHP), Dietary Guidelines: SurferLive.at  Summary  You can reduce your risk for type 2 diabetes by increasing your physical activity, eating healthy foods, and losing weight as directed.  Talk with your health care provider about your risk for type 2 diabetes. Ask about any blood tests or screening tests that you need to have. This information is not intended to replace advice given to you by your health care provider. Make sure you discuss any questions you have with your health care provider. Document Released: 10/13/2015 Document Revised: 11/27/2015 Document Reviewed: 08/12/2015 Elsevier Interactive Patient Education  2018 [employer].     Hypertension Hypertension is another name for high blood pressure. High blood pressure forces your heart to work harder to pump blood. This can cause problems over  time. There are two numbers in a blood pressure reading. There is a top number (systolic) over a bottom number (diastolic). It is best to have a blood pressure below 120/80. Healthy choices can help lower your blood pressure. You may need medicine to help lower your blood pressure if:  Your blood pressure cannot be lowered with healthy choices.  Your blood pressure is higher than 130/80.  Follow these instructions at home: Eating and drinking  If directed, follow the DASH eating plan. This diet includes: ? Filling half of your plate at each meal with fruits and vegetables. ? Filling one quarter of your plate at each meal with whole grains. Whole grains include whole wheat pasta, brown rice, and whole grain bread. ? Eating or drinking low-fat  dairy products, such as skim milk or low-fat yogurt. ? Filling one quarter of your plate at each meal with low-fat (lean) proteins. Low-fat proteins include fish, skinless chicken, eggs, beans, and tofu. ? Avoiding fatty meat, cured and processed meat, or chicken with skin. ? Avoiding premade or processed food.  Eat less than 1,500 mg of salt (sodium) a day.  Limit alcohol use to no more than 1 drink a day for nonpregnant women and 2 drinks a day for men. One drink equals 12 oz of beer, 5 oz of wine, or 1 oz of hard liquor. Lifestyle  Work with your doctor to stay at a healthy weight or to lose weight. Ask your doctor what the best weight is for you.  Get at least 30 minutes of exercise that causes your heart to beat faster (aerobic exercise) most days of the week. This may include walking, swimming, or biking.  Get at least 30 minutes of exercise that strengthens your muscles (resistance exercise) at least 3 days a week. This may include lifting weights or pilates.  Do not use any products that contain nicotine or tobacco. This includes cigarettes and e-cigarettes. If you need help quitting, ask your doctor.  Check your blood pressure at home as told by your doctor.  Keep all follow-up visits as told by your doctor. This is important. Medicines  Take over-the-counter and prescription medicines only as told by your doctor. Follow directions carefully.  Do not skip doses of blood pressure medicine. The medicine does not work as well if you skip doses. Skipping doses also puts you at risk for problems.  Ask your doctor about side effects or reactions to medicines that you should watch for. Contact a doctor if:  You think you are having a reaction to the medicine you are taking.  You have headaches that keep coming back (recurring).  You feel dizzy.  You have swelling in your ankles.  You have trouble with your vision. Get help right away if:  You get a very bad  headache.  You start to feel confused.  You feel weak or numb.  You feel faint.  You get very bad pain in your: ? Chest. ? Belly (abdomen).  You throw up (vomit) more than once.  You have trouble breathing. Summary  Hypertension is another name for high blood pressure.  Making healthy choices can help lower blood pressure. If your blood pressure cannot be controlled with healthy choices, you may need to take medicine. This information is not intended to replace advice given to you by your health care provider. Make sure you discuss any questions you have with your health care provider. Document Released: 12/08/2007 Document Revised: 05/19/2016 Document Reviewed: 05/19/2016 Elsevier Interactive  Patient Education  2018 [place].  Major Depressive Disorder, Adult Major depressive disorder (MDD) is a mental health condition. It may also be called clinical depression or unipolar depression. MDD usually causes feelings of sadness, hopelessness, or helplessness. MDD can also cause physical symptoms. It can interfere with work, school, relationships, and other everyday activities. MDD may be mild, moderate, or severe. It may occur once (single episode major depressive disorder) or it may occur multiple times (recurrent major depressive disorder). What are the causes? The exact cause of this condition is not known. MDD is most likely caused by a combination of things, which may include:  Genetic factors. These are traits that are passed along from parent to child.  Individual factors. Your personality, your behavior, and the way you handle your thoughts and feelings may contribute to MDD. This includes personality traits and behaviors learned from others.  Physical factors, such as: ? Differences in the part of your brain that controls emotion. This part of your brain may be different than it is in people who do not have MDD. ? Long-term (chronic) medical or psychiatric  illnesses.  Social factors. Traumatic experiences or major life changes may play a role in the development of MDD.  What increases the risk? This condition is more likely to develop in women. The following factors may also make you more likely to develop MDD:  A family history of depression.  Troubled family relationships.  Abnormally low levels of certain brain chemicals.  Traumatic events in childhood, especially abuse or the loss of a parent.  Being under a lot of stress, or long-term stress, especially from upsetting life experiences or losses.  A history of: ? Chronic physical illness. ? Other mental health disorders. ? Substance abuse.  Poor living conditions.  Experiencing social exclusion or discrimination on a regular basis.  What are the signs or symptoms? The main symptoms of MDD typically include:  Constant depressed or irritable mood.  Loss of interest in things and activities.  MDD symptoms may also include:  Sleeping or eating too much or too little.  Unexplained weight change.  Fatigue or low energy.  Feelings of worthlessness or guilt.  Difficulty thinking clearly or making decisions.  Thoughts of suicide or of harming others.  Physical agitation or weakness.  Isolation.  Severe cases of MDD may also occur with other symptoms, such as:  Delusions or hallucinations, in which you imagine things that are not real (psychotic depression).  Low-level depression that lasts at least a year (chronic depression or persistent depressive disorder).  Extreme sadness and hopelessness (melancholic depression).  Trouble speaking and moving (catatonic depression).  How is this diagnosed? This condition may be diagnosed based on:  Your symptoms.  Your medical history, including your mental health history. This may involve tests to evaluate your mental health. You may be asked questions about your lifestyle, including any drug and alcohol use, and how  long you have had symptoms of MDD.  A physical exam.  Blood tests to rule out other conditions.  You must have a depressed mood and at least four other MDD symptoms most of the day, nearly every day in the same 2-week timeframe before your health care provider can confirm a diagnosis of MDD. How is this treated? This condition is usually treated by mental health professionals, such as psychologists, psychiatrists, and clinical social workers. You may need more than one type of treatment. Treatment may include:  Psychotherapy. This is also called talk therapy or  counseling. Types of psychotherapy include: ? Cognitive behavioral therapy (CBT). This type of therapy teaches you to recognize unhealthy feelings, thoughts, and behaviors, and replace them with positive thoughts and actions. ? Interpersonal therapy (IPT). This helps you to improve the way you relate to and communicate with others. ? Family therapy. This treatment includes members of your family.  Medicine to treat anxiety and depression, or to help you control certain emotions and behaviors.  Lifestyle changes, such as: ? Limiting alcohol and drug use. ? Exercising regularly. ? Getting plenty of sleep. ? Making healthy eating choices. ? Spending more time outdoors.  Treatments involving stimulation of the brain can be used in situations with extremely severe symptoms, or when medicine or other therapies do not work over time. These treatments include electroconvulsive therapy, transcranial magnetic stimulation, and vagal nerve stimulation. Follow these instructions at home: Activity  Return to your normal activities as told by your health care provider.  Exercise regularly and spend time outdoors as told by your health care provider. General instructions  Take over-the-counter and prescription medicines only as told by your health care provider.  Do not drink alcohol. If you drink alcohol, limit your alcohol intake to no  more than 1 drink a day for nonpregnant women and 2 drinks a day for men. One drink equals 12 oz of beer, 5 oz of wine, or 1 oz of hard liquor. Alcohol can affect any antidepressant medicines you are taking. Talk to your health care provider about your alcohol use.  Eat a healthy diet and get plenty of sleep.  Find activities that you enjoy doing, and make time to do them.  Consider joining a support group. Your health care provider may be able to recommend a support group.  Keep all follow-up visits as told by your health care provider. This is important. Where to find more information: [employer] on Mental Illness  www.nami.org  U.S. National Institute of Mental Health  https://carter.com/  National Suicide Prevention Lifeline  1-800-273-TALK (587)514-2454). This is free, 24-hour help.  Contact a health care provider if:  Your symptoms get worse.  You develop new symptoms. Get help right away if:  You self-harm.  You have serious thoughts about hurting yourself or others.  You see, hear, taste, smell, or feel things that are not present (hallucinate). This information is not intended to replace advice given to you by your health care provider. Make sure you discuss any questions you have with your health care provider. Document Released: 10/16/2012 Document Revised: 02/26/2016 Document Reviewed: 12/31/2015 Elsevier Interactive Patient Education  2017 [employer].

## 2017-03-09 NOTE — Progress Notes (Signed)
Patient ID: Kristi [name], female    DOB: 1955-09-01, 61 y.o.   MRN: [id number]  PCP: Scot Jun, [name]  Chief Complaint  Patient presents with  . Establish Care  . Hospitalization Follow-up   Subjective:  Kristi [name] is a 61 y.o. female, with a medical history significant for major depression, GERD, osteoarthritis of left knee, chronic pain,  and atherosclerosis. Kristi [name] reports no  primary care provider for an extended period of time due to lack of health care insurance. Kristi [name] developed acute onset of chest pain and presented to [facility] 02/24/2017 and was admitted overnight for observation. Troponins were negative x 2, however she had a positive D-dimer, which PE was subsequently ruled out with a VQ scan. Stress test was also normal. She was discharged with antihypertension medication and referred for a sleep study. Today, Kristi [name] continues to complain of daily chest pain, decreased energy, and dyspnea with exertional activity. Improvement of breathing and chest pain occurs with rest. Kristi [name] reports no routine physical activity as her current job is very sedentary. Current Body mass index is 48.42 kg/m.  She reports chronic pain knee, legs, and back which she attributes to worsening depression symptoms. Previously treated for complicated depression with agoraphobia symptoms several years ago. Prior treatment for depression includes amitriptyline and Xanax. At present she is not prescribed any medication for depression. Social History   Social History  . Marital status: Divorced    Spouse name: N/A  . Number of children: N/A  . Years of education: N/A   Occupational History  . Not on file.   Social History Main Topics  . Smoking status: Former Smoker    Packs/day: 3.00    Types: Cigarettes    Quit date: 02/27/2006  . Smokeless tobacco: Never Used  . Alcohol use No  . Drug use: No  . Sexual activity: Not on file   Other Topics Concern  . Not  on file   Social History Narrative  . No narrative on file    Family History  Problem Relation Age of Onset  . Heart disease Mother        3 CABG, stents placements.   . Heart attack Mother        late 38s  . Cancer Mother   . Diabetes Mother   . Diabetes Other   . Heart disease Other    Review of Systems See Kristi   Patient Active Problem List   Diagnosis Date Noted  . Chest pain 02/24/2017  . Atypical chest pain 05/10/2016  . HTN (hypertension) 05/10/2016  . Hypertensive emergency 05/10/2016  . Anxiety 05/10/2016  . Gastroesophageal reflux disease with esophagitis     Allergies  Allergen Reactions  . Fish Allergy Anaphylaxis  . Iodine Anaphylaxis  . Sulfur Hives    Prior to Admission medications   Medication Sig Start Date End Date Taking? Authorizing Provider  acetaminophen (TYLENOL) 325 MG tablet Take 2 tablets (650 mg total) by mouth every 4 (four) hours as needed for headache or mild pain. 05/11/16   Elgergawy, Silver Huguenin, MD  albuterol (PROVENTIL HFA;VENTOLIN HFA) 108 (90 Base) MCG/ACT inhaler Inhale 2 puffs into the lungs every 6 (six) hours as needed for wheezing or shortness of breath.    [provider]  aspirin EC 81 MG tablet Take 1 tablet (81 mg total) by mouth daily. 05/11/16   Elgergawy, Silver Huguenin, MD  lisinopril-hydrochlorothiazide (ZESTORETIC) 20-12.5 MG tablet Take 1 tablet by mouth daily. 02/25/17  Patwardhan, Manish J, MD  omeprazole (PRILOSEC OTC) 20 MG tablet Take 20 mg by mouth daily as needed (for acid reflux).    [provider]   Past Medical, Surgical Family and Social History reviewed and updated.  Objective:   Today's Vitals   03/09/17 0947  BP: 138/88  Pulse: 94  Resp: 16  Temp: 98.3 F (36.8 C)  TempSrc: Oral  SpO2: 99%  Weight: 291 lb (132 kg)  Height: 5\' 5"  (1.651 m)     Wt Readings from Last 3 Encounters:  02/25/17 293 lb 6.9 oz (133.1 kg)  05/11/16 293 lb 6.4 oz (133.1 kg)  09/24/13 (!) 305 lb (138.3 kg)    Physical Exam  Constitutional: She is oriented to person, place, and time. She appears well-developed and well-nourished.  HENT:  Head: Normocephalic and atraumatic.  Eyes: Pupils are equal, round, and reactive to light. Conjunctivae are normal.  Neck: Normal range of motion. Neck supple. No thyromegaly present.  Cardiovascular: Normal rate, regular rhythm, normal heart sounds and intact distal pulses.   Pulmonary/Chest: Effort normal and breath sounds normal.  Musculoskeletal: She exhibits tenderness. She exhibits no edema.  Lymphadenopathy:    She has no cervical adenopathy.  Neurological: She is alert and oriented to person, place, and time.  Antalgic gait.   Skin: Skin is warm and dry.  Psychiatric: She has a normal mood and affect. Her behavior is normal. Judgment and thought content normal.   Assessment & Plan:  1. Essential hypertension -Continue current ACE/Diuretic therapy 2. Chest pain, unspecified type, suspect symptoms could be related to anxiety and depression. She has had a complete benign cardiac work-up. Recommended to continue follow-up with cardiology on 03/11/2017. 3. Screening for diabetes mellitus 4. Screening for thyroid disorder 5. Need for influenza vaccination 6. Need for Tdap vaccination 7. Anxiety and depression, chronic , active -Will trial patient on escitalopram 10 mg once daily at bedtime -Patient given information to follow-up with [facility]  8. Other chronic pain -Meloxicam 15 mg once daily as needed for pain.  Orders Placed This Encounter  Procedures  . Thyroid Panel With TSH  . POCT glycosylated hemoglobin (Hb A1C)    Meds ordered this encounter  Medications  . meloxicam (MOBIC) 15 MG tablet    Sig: Take 1 tablet (15 mg total) by mouth daily.    Dispense:  30 tablet    Refill:  2    Order Specific Question:   Supervising Provider    Answer:   Tresa Garter [id number]  . escitalopram (LEXAPRO) 10 MG tablet    Sig:  Take 1 tablet (10 mg total) by mouth at bedtime.    Dispense:  30 tablet    Refill:  2    Order Specific Question:   Supervising Provider    Answer:   Tresa Garter [[id number]]   -[facility] patient assistance application provided.  RTC: 6 weeks, medication evaluation follow-up on symptoms of depression/anxiety    Carroll Sage. Kenton Kingfisher, MSN, [name]-C [facility]  [address] [place] [place], [place] [address] [phone]

## 2017-03-10 LAB — THYROID PANEL WITH TSH
FREE THYROXINE INDEX: 2.6 (ref 1.4–3.8)
T3 Uptake: 27 % (ref 22–35)
T4, Total: 9.6 ug/dL (ref 5.1–11.9)
TSH: 2.38 m[IU]/L (ref 0.40–4.50)

## 2017-03-10 LAB — EXTRA LAV TOP TUBE

## 2017-04-20 ENCOUNTER — Ambulatory Visit: Payer: Self-pay | Admitting: Family Medicine

## 2017-04-26 ENCOUNTER — Other Ambulatory Visit: Payer: Self-pay

## 2017-04-26 ENCOUNTER — Encounter: Payer: Self-pay | Admitting: Family Medicine

## 2017-04-26 MED ORDER — ALBUTEROL SULFATE HFA 108 (90 BASE) MCG/ACT IN AERS: 2.0000 | INHALATION_SPRAY | Freq: Four times a day (QID) | RESPIRATORY_TRACT | 2 refills | Status: DC | PRN

## 2017-06-22 ENCOUNTER — Other Ambulatory Visit: Payer: Self-pay | Admitting: Family Medicine

## 2017-10-01 ENCOUNTER — Other Ambulatory Visit: Payer: Self-pay | Admitting: Family Medicine

## 2017-11-16 ENCOUNTER — Encounter (HOSPITAL_COMMUNITY): Payer: Self-pay | Admitting: Emergency Medicine

## 2017-11-16 ENCOUNTER — Emergency Department (HOSPITAL_COMMUNITY)
Admission: EM | Admit: 2017-11-16 | Discharge: 2017-11-17 | Disposition: A | Discharge status: Home / Self Care | Payer: Self-pay | Attending: Emergency Medicine | Admitting: Emergency Medicine

## 2017-11-16 DIAGNOSIS — Z79899 Other long term (current) drug therapy: Secondary | ICD-10-CM | POA: Insufficient documentation

## 2017-11-16 DIAGNOSIS — N179 Acute kidney failure, unspecified: Secondary | ICD-10-CM | POA: Insufficient documentation

## 2017-11-16 DIAGNOSIS — Z7982 Long term (current) use of aspirin: Secondary | ICD-10-CM | POA: Insufficient documentation

## 2017-11-16 DIAGNOSIS — E876 Hypokalemia: Secondary | ICD-10-CM | POA: Insufficient documentation

## 2017-11-16 DIAGNOSIS — J45909 Unspecified asthma, uncomplicated: Secondary | ICD-10-CM | POA: Insufficient documentation

## 2017-11-16 DIAGNOSIS — N12 Tubulo-interstitial nephritis, not specified as acute or chronic: Secondary | ICD-10-CM

## 2017-11-16 DIAGNOSIS — N10 Acute tubulo-interstitial nephritis: Secondary | ICD-10-CM | POA: Insufficient documentation

## 2017-11-16 DIAGNOSIS — Z87891 Personal history of nicotine dependence: Secondary | ICD-10-CM | POA: Insufficient documentation

## 2017-11-16 DIAGNOSIS — I10 Essential (primary) hypertension: Secondary | ICD-10-CM | POA: Insufficient documentation

## 2017-11-16 DIAGNOSIS — Z9049 Acquired absence of other specified parts of digestive tract: Secondary | ICD-10-CM | POA: Insufficient documentation

## 2017-11-16 DIAGNOSIS — F419 Anxiety disorder, unspecified: Secondary | ICD-10-CM | POA: Insufficient documentation

## 2017-11-16 DIAGNOSIS — F329 Major depressive disorder, single episode, unspecified: Secondary | ICD-10-CM | POA: Insufficient documentation

## 2017-11-16 HISTORY — DX: Migraine, unspecified, not intractable, without status migrainosus: G43.909

## 2017-11-16 HISTORY — DX: Major depressive disorder, single episode, unspecified: F32.9

## 2017-11-16 HISTORY — DX: Depression, unspecified: F32.A

## 2017-11-16 LAB — COMPREHENSIVE METABOLIC PANEL
ALT: 15 U/L (ref 14–54)
AST: 22 U/L (ref 15–41)
Albumin: 3.3 g/dL — ABNORMAL LOW (ref 3.5–5.0)
Alkaline Phosphatase: 67 U/L (ref 38–126)
Anion gap: 14 (ref 5–15)
BUN: 21 mg/dL — ABNORMAL HIGH (ref 6–20)
CHLORIDE: 99 mmol/L — AB (ref 101–111)
CO2: 26 mmol/L (ref 22–32)
Calcium: 8.7 mg/dL — ABNORMAL LOW (ref 8.9–10.3)
Creatinine, Ser: 1.2 mg/dL — ABNORMAL HIGH (ref 0.44–1.00)
GFR, EST AFRICAN AMERICAN: 55 mL/min — AB (ref >60)
GFR, EST NON AFRICAN AMERICAN: 47 mL/min — AB (ref >60)
Glucose, Bld: 98 mg/dL (ref 65–99)
POTASSIUM: 3 mmol/L — AB (ref 3.5–5.1)
SODIUM: 139 mmol/L (ref 135–145)
Total Bilirubin: 0.9 mg/dL (ref 0.3–1.2)
Total Protein: 7.7 g/dL (ref 6.5–8.1)

## 2017-11-16 LAB — URINALYSIS, ROUTINE W REFLEX MICROSCOPIC
BILIRUBIN URINE: NEGATIVE
Glucose, UA: NEGATIVE mg/dL
Ketones, ur: 20 mg/dL — AB
Nitrite: NEGATIVE
PROTEIN: 100 mg/dL — AB
SPECIFIC GRAVITY, URINE: 1.021 (ref 1.005–1.030)
WBC, UA: >50 WBC/hpf — ABNORMAL HIGH (ref 0–5)
pH: 5 (ref 5.0–8.0)

## 2017-11-16 LAB — CBC
HEMATOCRIT: 41.4 % (ref 36.0–46.0)
Hemoglobin: 13.2 g/dL (ref 12.0–15.0)
MCH: 27.6 pg (ref 26.0–34.0)
MCHC: 31.9 g/dL (ref 30.0–36.0)
MCV: 86.4 fL (ref 78.0–100.0)
Platelets: 246 10*3/uL (ref 150–400)
RBC: 4.79 MIL/uL (ref 3.87–5.11)
RDW: 13.3 % (ref 11.5–15.5)
WBC: 9.1 10*3/uL (ref 4.0–10.5)

## 2017-11-16 LAB — LIPASE, BLOOD: LIPASE: 28 U/L (ref 11–51)

## 2017-11-16 MED ORDER — LACTATED RINGERS IV BOLUS
1000.0000 mL | Freq: Once | INTRAVENOUS | Status: AC
  Administered 2017-11-16: 1000 mL via INTRAVENOUS

## 2017-11-16 MED ORDER — POTASSIUM CHLORIDE CRYS ER 20 MEQ PO TBCR
40.0000 meq | EXTENDED_RELEASE_TABLET | Freq: Once | ORAL | Status: AC
  Administered 2017-11-17: 40 meq via ORAL
  Filled 2017-11-16: qty 2

## 2017-11-16 MED ORDER — HYDROCODONE-ACETAMINOPHEN 5-325 MG PO TABS
2.0000 | ORAL_TABLET | Freq: Once | ORAL | Status: AC
  Administered 2017-11-16: 2 via ORAL
  Filled 2017-11-16: qty 2

## 2017-11-16 MED ORDER — ONDANSETRON 4 MG PO TBDP
4.0000 mg | ORAL_TABLET | Freq: Once | ORAL | Status: AC | PRN
  Administered 2017-11-16: 4 mg via ORAL
  Filled 2017-11-16: qty 1

## 2017-11-16 MED ORDER — SODIUM CHLORIDE 0.9 % IV SOLN
1.0000 g | Freq: Once | INTRAVENOUS | Status: AC
  Administered 2017-11-16: 1 g via INTRAVENOUS
  Filled 2017-11-16: qty 10

## 2017-11-16 NOTE — ED Notes (Signed)
Could not get blood and pt is aware we need a urine sample

## 2017-11-16 NOTE — ED Triage Notes (Signed)
Pt reports over the weekend starting having bladder pressure and urinary incontinence. Reports also having generalized body aches and vomiting and fevers and taking tylenol for fevers. Sweats and chills.

## 2017-11-16 NOTE — ED Notes (Signed)
Attempted blood draw x2, was unsuccessful 

## 2017-11-16 NOTE — ED Provider Notes (Signed)
[facility] DEPT Provider Note   CSN: [id number] Arrival date & time: 11/16/17  1738     History   Chief Complaint Chief Complaint  Patient presents with  . Generalized Body Aches  . Emesis  . Urinary Incontinence    HPI Kristi [name] is a 62 y.o. female.  62 year old female with past medical history below including hypertension, migraines, depression, asthma, GERD who presents with urinary symptoms.  5 days ago, she began having bladder pressure, urinary urgency, and episodes of urinary incontinence.  She has had problems with incontinence previously but this has been much worse than usual.  The following day she began having generalized body aches.  She reports subjective fevers as well as sweats and chills for which she has been taking Tylenol.  She has had some nausea and gagging episodes.  No vaginal bleeding/discharge and she is not sexually active.  She reports pain across her lower abdomen.  No cough/cold symptoms or diarrhea.  Reports back pain that is located across her upper back and across her lower back as well as down the middle of her back.  The history is provided by the patient.  Emesis      Past Medical History:  Diagnosis Date  . Angina at rest [facility])   . Arthritis   . Asthma   . Depression   . GERD (gastroesophageal reflux disease)   . Hypertension   . Migraines     Patient Active Problem List   Diagnosis Date Noted  . Chest pain 02/24/2017  . Atypical chest pain 05/10/2016  . HTN (hypertension) 05/10/2016  . Hypertensive emergency 05/10/2016  . Anxiety 05/10/2016  . Gastroesophageal reflux disease with esophagitis     Past Surgical History:  Procedure Laterality Date  . ABDOMINAL HYSTERECTOMY    . APPENDECTOMY    . CHOLECYSTECTOMY       OB History   None      Home Medications    Prior to Admission medications   Medication Sig Start Date End Date Taking? Authorizing Provider  acetaminophen (TYLENOL)  650 MG CR tablet Take 1,950 mg by mouth every 6 (six) hours as needed for pain.   Yes [provider]  albuterol (PROVENTIL HFA;VENTOLIN HFA) 108 (90 Base) MCG/ACT inhaler Inhale 2 puffs into the lungs every 6 (six) hours as needed for wheezing or shortness of breath. 04/26/17  Yes Scot Jun, [name]  aspirin EC 81 MG tablet Take 1 tablet (81 mg total) by mouth daily. 05/11/16  Yes Elgergawy, Silver Huguenin, MD  escitalopram (LEXAPRO) 10 MG tablet TAKE 1 TABLET BY MOUTH AT BEDTIME 06/22/17  Yes Scot Jun, [name]  lisinopril-hydrochlorothiazide (ZESTORETIC) 20-12.5 MG tablet Take 1 tablet by mouth daily. 02/25/17  Yes Patwardhan, Reynold Bowen, MD  meloxicam (MOBIC) 15 MG tablet TAKE 1 TABLET BY MOUTH EVERY DAY 06/22/17  Yes Scot Jun, [name]    Family History Family History  Problem Relation Age of Onset  . Heart disease Mother        3 CABG, stents placements.   . Heart attack Mother        late 64s  . Cancer Mother   . Diabetes Mother   . Diabetes Other   . Heart disease Other     Social History Social History   Tobacco Use  . Smoking status: Former Smoker    Packs/day: 3.00    Types: Cigarettes    Last attempt to quit: 02/27/2006    Years  since quitting: 11.7  . Smokeless tobacco: Never Used  Substance Use Topics  . Alcohol use: No  . Drug use: No     Allergies   Fish allergy; Iodine; and Sulfur   Review of Systems Review of Systems  Gastrointestinal: Positive for vomiting.   All other systems reviewed and are negative except that which was mentioned in HPI   Physical Exam Updated Vital Signs BP (!) 135/116 (BP Location: Left Arm)   Pulse (!) 102   Temp 98.2 F (36.8 C) (Oral)   Resp 20   Ht 5\' 5"  (1.651 m)   Wt 133.4 kg (294 lb)   SpO2 99%   BMI 48.92 kg/m   Physical Exam  Constitutional: She is oriented to person, place, and time. She appears well-developed and well-nourished. No distress.  HENT:  Head: Normocephalic and atraumatic.    Moist mucous membranes  Eyes: Conjunctivae are normal.  Neck: Neck supple.  Cardiovascular: Normal rate, regular rhythm and normal heart sounds.  No murmur heard. Pulmonary/Chest: Effort normal and breath sounds normal.  Abdominal: Soft. Bowel sounds are normal. She exhibits no distension. There is tenderness. There is no rebound and no guarding.  Mild tenderness across lower abdomen worst in suprapubic abd  Musculoskeletal: She exhibits no edema.  Neurological: She is alert and oriented to person, place, and time.  Fluent speech  Skin: Skin is warm and dry.  Psychiatric: She has a normal mood and affect. Judgment normal.  Nursing note and vitals reviewed.    ED Treatments / Results  Labs (all labs ordered are listed, but only abnormal results are displayed) Labs Reviewed  COMPREHENSIVE METABOLIC PANEL - Abnormal; Notable for the following components:      Result Value   Potassium 3.0 (*)    Chloride 99 (*)    BUN 21 (*)    Creatinine, Ser 1.20 (*)    Calcium 8.7 (*)    Albumin 3.3 (*)    GFR calc non Af Amer 47 (*)    GFR calc Af Amer 55 (*)    All other components within normal limits  URINALYSIS, ROUTINE W REFLEX MICROSCOPIC - Abnormal; Notable for the following components:   Color, Urine AMBER (*)    APPearance CLOUDY (*)    Hgb urine dipstick MODERATE (*)    Ketones, ur 20 (*)    Protein, ur 100 (*)    Leukocytes, UA LARGE (*)    WBC, UA >50 (*)    Bacteria, UA MANY (*)    Non Squamous Epithelial 0-5 (*)    All other components within normal limits  URINE CULTURE  LIPASE, BLOOD  CBC    EKG None  Radiology No results found.  Procedures Procedures (including critical care time)  Medications Ordered in ED Medications  lactated ringers bolus 1,000 mL (has no administration in time range)  potassium chloride SA (K-DUR,KLOR-CON) CR tablet 40 mEq (has no administration in time range)  ondansetron (ZOFRAN-ODT) disintegrating tablet 4 mg (4 mg Oral Given  11/16/17 1801)  cefTRIAXone (ROCEPHIN) 1 g in sodium chloride 0.9 % 100 mL IVPB (1 g Intravenous New Bag/Given 11/16/17 2252)  HYDROcodone-acetaminophen (NORCO/VICODIN) 5-325 MG per tablet 2 tablet (2 tablets Oral Given 11/16/17 2307)     Initial Impression / Assessment and Plan / ED Course  I have reviewed the triage vital signs and the nursing notes.  Pertinent labs & imaging results that were available during my care of the patient were reviewed by me and considered  in my medical decision making (see chart for details).    Pt non-toxic on exam, afebrile, mild HTN but otherwise stable.  Labs show potassium 3.0, creatinine 1.2, BUN 21, normal CBC, UA with evidence of infection.  Urine culture sent.  Gave the patient ceftriaxone as well as IV fluid bolus, potassium, and pain medications.  Because of her reported fevers, body aches, and back pain, will treat as pyelonephritis.  Instructed her to follow-up with PCP in a few days for reevaluation.  Extensively reviewed return precautions.  She voiced understanding and was discharged in satisfactory condition.  Final Clinical Impressions(s) / ED Diagnoses   Final diagnoses:  None    ED Discharge Orders    None       Tamyra Fojtik, Wenda Overland, MD 11/16/17 [phone]

## 2017-11-17 MED ORDER — CEPHALEXIN 500 MG PO CAPS: 500.0000 mg | ORAL_CAPSULE | Freq: Four times a day (QID) | ORAL | 0 refills | Status: AC

## 2017-11-17 MED ORDER — HYDROCODONE-ACETAMINOPHEN 5-325 MG PO TABS: 1.0000 | ORAL_TABLET | Freq: Four times a day (QID) | ORAL | 0 refills | Status: DC | PRN

## 2017-11-17 MED ORDER — POTASSIUM CHLORIDE ER 10 MEQ PO TBCR: 10.0000 meq | EXTENDED_RELEASE_TABLET | Freq: Every day | ORAL | 0 refills | Status: DC

## 2017-11-17 NOTE — ED Notes (Addendum)
Per pt request, son Shonna Chock was contacted to pick pt up from ED.

## 2017-11-19 LAB — URINE CULTURE
Culture: >=100000 — AB
SPECIAL REQUESTS: NORMAL

## 2017-11-22 ENCOUNTER — Encounter: Payer: Self-pay | Admitting: Family Medicine

## 2018-10-05 ENCOUNTER — Telehealth: Payer: MEDICAID | Admitting: Physician Assistant

## 2018-10-05 DIAGNOSIS — H538 Other visual disturbances: Secondary | ICD-10-CM

## 2018-10-05 DIAGNOSIS — R0602 Shortness of breath: Secondary | ICD-10-CM

## 2018-10-05 DIAGNOSIS — M7989 Other specified soft tissue disorders: Secondary | ICD-10-CM

## 2018-10-05 NOTE — Progress Notes (Signed)
  E-Visit for Corona Virus Screening  Based on what you have shared with me, you need to seek an evaluation for a severe illness that is causing your symptoms which may be coronavirus or some other illness. I recommend that you be seen and evaluated "face to face". Our Emergency Departments are best equipped to handle patients with severe symptoms.   I recommend the following:  . If you are having a true medical emergency please call 911. . If you are considered high risk for Corona virus because of a known exposure, fever, shortness of breath and cough, OR if you have severe symptoms of any kind, seek medical care at an emergency room.  . Please call ahead and tell them that you were seen by telemedicine and they have recommended that you have a face to face evaluation. . [facility] Emergency Department [address], [place], [place] [address] [phone]  . [facility] [place] Emergency Department [address], [place], [place] [address] [phone]  . [facility] Emergency Department [address], [place], [place] [address] [phone]  . [facility] Emergency Department [address], [place], [place] [address] [phone]  . [facility] Emergency Department [address], [place],  [address] [phone]  NOTE: If you entered your credit card information for this eVisit, you will not be charged. You may see a "hold" on your card for the $35 but that hold will drop off and you will not have a charge processed.   Your e-visit answers were reviewed by a board certified advanced clinical practitioner to complete your personal care plan.  Thank you for using e-Visits.  

## 2019-06-07 ENCOUNTER — Other Ambulatory Visit: Payer: Self-pay

## 2019-06-07 ENCOUNTER — Ambulatory Visit
Admission: RE | Admit: 2019-06-07 | Discharge: 2019-06-07 | Disposition: A | Discharge status: Home / Self Care | Payer: Self-pay | Source: Ambulatory Visit | Attending: Emergency Medicine | Admitting: Emergency Medicine

## 2019-06-07 DIAGNOSIS — Z029 Encounter for administrative examinations, unspecified: Secondary | ICD-10-CM | POA: Insufficient documentation

## 2019-06-07 NOTE — ED Provider Notes (Signed)
Patient scheduled telehealth visit for 17:00 on 06/07/19.  Attempted to initiate video visit via patient preferred method of text message invitation without response. Given no response via video visit, called patient @ provided number without answer. Waited 15 minutes without response therefore visit was closed. Patient was not evaluated.    Amaryllis Dyke, PA-C 06/07/19 1716    Blanchie Dessert, MD 06/08/19 1143

## 2019-10-10 ENCOUNTER — Telehealth: Payer: MEDICAID | Admitting: Nurse Practitioner

## 2019-10-10 DIAGNOSIS — J0100 Acute maxillary sinusitis, unspecified: Secondary | ICD-10-CM

## 2019-10-10 MED ORDER — AMOXICILLIN-POT CLAVULANATE 875-125 MG PO TABS: 1.0000 | ORAL_TABLET | Freq: Two times a day (BID) | ORAL | 0 refills | Status: DC

## 2019-10-10 NOTE — Progress Notes (Signed)

## 2020-03-14 DIAGNOSIS — G43911 Migraine, unspecified, intractable, with status migrainosus: Secondary | ICD-10-CM | POA: Diagnosis not present

## 2020-03-21 DIAGNOSIS — Z0279 Encounter for issue of other medical certificate: Secondary | ICD-10-CM | POA: Diagnosis not present

## 2020-03-21 DIAGNOSIS — R0981 Nasal congestion: Secondary | ICD-10-CM | POA: Diagnosis not present

## 2020-03-21 DIAGNOSIS — R062 Wheezing: Secondary | ICD-10-CM | POA: Diagnosis not present

## 2020-03-21 DIAGNOSIS — R05 Cough: Secondary | ICD-10-CM | POA: Diagnosis not present

## 2020-03-25 DIAGNOSIS — R05 Cough: Secondary | ICD-10-CM | POA: Diagnosis not present

## 2020-07-23 ENCOUNTER — Other Ambulatory Visit: Payer: Self-pay | Admitting: Internal Medicine

## 2020-07-23 DIAGNOSIS — Z1231 Encounter for screening mammogram for malignant neoplasm of breast: Secondary | ICD-10-CM

## 2020-08-07 ENCOUNTER — Other Ambulatory Visit (HOSPITAL_BASED_OUTPATIENT_CLINIC_OR_DEPARTMENT_OTHER): Payer: Self-pay

## 2020-08-07 DIAGNOSIS — G4710 Hypersomnia, unspecified: Secondary | ICD-10-CM

## 2020-08-07 DIAGNOSIS — R5383 Other fatigue: Secondary | ICD-10-CM

## 2020-08-07 DIAGNOSIS — R0683 Snoring: Secondary | ICD-10-CM

## 2020-09-20 ENCOUNTER — Ambulatory Visit (HOSPITAL_BASED_OUTPATIENT_CLINIC_OR_DEPARTMENT_OTHER): Payer: MEDICAID | Attending: Internal Medicine | Admitting: Internal Medicine

## 2020-10-04 ENCOUNTER — Telehealth: Payer: Medicare Other

## 2020-10-04 ENCOUNTER — Encounter: Payer: Self-pay | Admitting: Physician Assistant

## 2020-10-04 ENCOUNTER — Telehealth: Payer: Medicare Other | Admitting: Physician Assistant

## 2020-10-04 DIAGNOSIS — J309 Allergic rhinitis, unspecified: Secondary | ICD-10-CM

## 2020-10-04 DIAGNOSIS — H1013 Acute atopic conjunctivitis, bilateral: Secondary | ICD-10-CM | POA: Diagnosis not present

## 2020-10-04 MED ORDER — CETIRIZINE HCL 10 MG PO TABS: 10.0000 mg | ORAL_TABLET | Freq: Every day | ORAL | 0 refills | Status: DC

## 2020-10-04 MED ORDER — PATADAY 0.7 % OP SOLN: 1.0000 [drp] | Freq: Every day | OPHTHALMIC | 0 refills | Status: DC

## 2020-10-04 MED ORDER — FLUTICASONE PROPIONATE 50 MCG/ACT NA SUSP: 2.0000 | Freq: Every day | NASAL | 6 refills | Status: DC

## 2020-10-04 NOTE — Progress Notes (Signed)
E visit for Allergic Rhinitis We are sorry that you are not feeling well.  Here is how we plan to help!  Based on what you have shared with me it looks like you have Allergic Rhinitis.  Rhinitis is when a reaction occurs that causes nasal congestion, runny nose, sneezing, and itching.  Most types of rhinitis are caused by an inflammation and are associated with symptoms in the eyes ears or throat. There are several types of rhinitis.  The most common are acute rhinitis, which is usually caused by a viral illness, allergic or seasonal rhinitis, and nonallergic or year-round rhinitis.  Nasal allergies occur certain times of the year.  Allergic rhinitis is caused when allergens in the air trigger the release of histamine in the body.  Histamine causes itching, swelling, and fluid to build up in the fragile linings of the nasal passages, sinuses and eyelids.  An itchy nose and clear discharge are common.  I recommend the following over the counter treatments: You should take a daily dose of antihistamine and Xyzal 5 mg take 1 tablet daily  I also would recommend a nasal spray: Flonase 2 sprays into each nostril once daily   I prescribed pataday 0.7% eye drops for itching. Use one drop in both eyes once daily.    HOME CARE:   You can use an over-the-counter saline nasal spray as needed  Avoid areas where there is heavy dust, mites, or molds  Stay indoors on windy days during the pollen season  Keep windows closed in home, at least in bedroom; use air conditioner.  Use high-efficiency house air filter  Keep windows closed in car, turn AC on re-circulate  Avoid playing out with dog during pollen season  GET HELP RIGHT AWAY IF:   If your symptoms do not improve within 10 days  You become short of breath  You develop yellow or green discharge from your nose for over 3 days  You have coughing fits  MAKE SURE YOU:   Understand these instructions  Will watch your  condition  Will get help right away if you are not doing well or get worse  Thank you for choosing an e-visit. Your e-visit answers were reviewed by a board certified advanced clinical practitioner to complete your personal care plan. Depending upon the condition, your plan could have included both over the counter or prescription medications. Please review your pharmacy choice. Be sure that the pharmacy you have chosen is open so that you can pick up your prescription now.  If there is a problem you may message your provider in [place] to have the prescription routed to another pharmacy. Your safety is important to [place]. If you have drug allergies check your prescription carefully.  For the next 24 hours, you can use MyChart to ask questions about today's visit, request a non-urgent call back, or ask for a work or school excuse from your e-visit provider. You will get an email in the next two days asking about your experience. I hope that your e-visit has been valuable and will speed your recovery.        I spent 5-10 minutes on review and completion of this note- Lacy Duverney [facility]

## 2020-10-10 ENCOUNTER — Ambulatory Visit (HOSPITAL_BASED_OUTPATIENT_CLINIC_OR_DEPARTMENT_OTHER): Payer: MEDICAID | Attending: Internal Medicine | Admitting: Internal Medicine

## 2020-10-11 ENCOUNTER — Telehealth: Payer: Medicare Other | Admitting: Physician Assistant

## 2020-10-11 ENCOUNTER — Encounter: Payer: Self-pay | Admitting: Physician Assistant

## 2020-10-11 DIAGNOSIS — H5710 Ocular pain, unspecified eye: Secondary | ICD-10-CM

## 2020-10-11 DIAGNOSIS — J018 Other acute sinusitis: Secondary | ICD-10-CM

## 2020-10-11 MED ORDER — AMOXICILLIN-POT CLAVULANATE 875-125 MG PO TABS: 1.0000 | ORAL_TABLET | Freq: Two times a day (BID) | ORAL | 0 refills | Status: DC

## 2020-10-11 NOTE — Progress Notes (Addendum)
We are sorry that you are not feeling well.  Here is how we plan to help!  Based on what you have shared with me it looks like you have sinusitis.  Sinusitis is inflammation and infection in the sinus cavities of the head.  Based on your presentation I believe you most likely have Acute Bacterial Sinusitis.  This is an infection caused by bacteria and is treated with antibiotics. I have prescribed Augmentin 875mg /125mg  one tablet twice daily with food, for 7 days. You may use an oral decongestant such as Mucinex D or if you have glaucoma or high blood pressure use plain Mucinex. Saline nasal spray help and can safely be used as often as needed for congestion.  If you develop worsening sinus pain, fever or notice severe headache and vision changes, or if symptoms are not better after completion of antibiotic, please schedule an appointment with a health care provider.    Sinus infections are not as easily transmitted as other respiratory infection, however we still recommend that you avoid close contact with loved ones, especially the very young and elderly.  Remember to wash your hands thoroughly throughout the day as this is the number one way to prevent the spread of infection!  Ms. Furgerson  As discussed via messages, you should have a face to face visit for further evaluation of your eye pain. As advised, if you do have eye pain, please go to an urgent care of the ER    Home Care:  Only take medications as instructed by your medical team.  Complete the entire course of an antibiotic.  Do not take these medications with alcohol.  A steam or ultrasonic humidifier can help congestion.  You can place a towel over your head and breathe in the steam from hot water coming from a faucet.  Avoid close contacts especially the very young and the elderly.  Cover your mouth when you cough or sneeze.  Always remember to wash your hands.  Get Help Right Away If:  You develop worsening fever or sinus  pain.  You develop a severe head ache or visual changes.  Your symptoms persist after you have completed your treatment plan.  Make sure you  Understand these instructions.  Will watch your condition.  Will get help right away if you are not doing well or get worse.  Your e-visit answers were reviewed by a board certified advanced clinical practitioner to complete your personal care plan.  Depending on the condition, your plan could have included both over the counter or prescription medications.  If there is a problem please reply  once you have received a response from your provider.  Your safety is important to [place].  If you have drug allergies check your prescription carefully.    You can use MyChart to ask questions about today's visit, request a non-urgent call back, or ask for a work or school excuse for 24 hours related to this e-Visit. If it has been greater than 24 hours you will need to follow up with your provider, or enter a new e-Visit to address those concerns.  You will get an e-mail in the next two days asking about your experience.  I hope that your e-visit has been valuable and will speed your recovery. Thank you for using e-visits.   I spent 5-10 minutes on review and completion of this note- Lacy Duverney [facility]

## 2020-10-16 ENCOUNTER — Encounter (INDEPENDENT_AMBULATORY_CARE_PROVIDER_SITE_OTHER): Payer: Self-pay

## 2020-10-22 ENCOUNTER — Other Ambulatory Visit (INDEPENDENT_AMBULATORY_CARE_PROVIDER_SITE_OTHER): Payer: Self-pay | Admitting: Physician Assistant

## 2020-11-17 ENCOUNTER — Ambulatory Visit: Payer: Medicare Other

## 2020-11-19 ENCOUNTER — Other Ambulatory Visit: Payer: Self-pay | Admitting: Internal Medicine

## 2020-11-19 DIAGNOSIS — Z1231 Encounter for screening mammogram for malignant neoplasm of breast: Secondary | ICD-10-CM

## 2021-01-14 DIAGNOSIS — Z1231 Encounter for screening mammogram for malignant neoplasm of breast: Secondary | ICD-10-CM

## 2021-02-16 ENCOUNTER — Ambulatory Visit: Payer: Medicare Other | Admitting: Nurse Practitioner

## 2021-02-23 ENCOUNTER — Other Ambulatory Visit: Payer: Self-pay

## 2021-02-23 ENCOUNTER — Ambulatory Visit (INDEPENDENT_AMBULATORY_CARE_PROVIDER_SITE_OTHER): Payer: Medicare Other | Admitting: Nurse Practitioner

## 2021-02-23 ENCOUNTER — Encounter: Payer: Self-pay | Admitting: Nurse Practitioner

## 2021-02-23 DIAGNOSIS — F339 Major depressive disorder, recurrent, unspecified: Secondary | ICD-10-CM

## 2021-02-23 DIAGNOSIS — G43109 Migraine with aura, not intractable, without status migrainosus: Secondary | ICD-10-CM | POA: Diagnosis not present

## 2021-02-23 DIAGNOSIS — I10 Essential (primary) hypertension: Secondary | ICD-10-CM

## 2021-02-23 DIAGNOSIS — F419 Anxiety disorder, unspecified: Secondary | ICD-10-CM | POA: Diagnosis not present

## 2021-02-23 DIAGNOSIS — M159 Polyosteoarthritis, unspecified: Secondary | ICD-10-CM

## 2021-02-23 DIAGNOSIS — G2581 Restless legs syndrome: Secondary | ICD-10-CM

## 2021-02-23 DIAGNOSIS — J45909 Unspecified asthma, uncomplicated: Secondary | ICD-10-CM

## 2021-02-23 DIAGNOSIS — E559 Vitamin D deficiency, unspecified: Secondary | ICD-10-CM

## 2021-02-23 DIAGNOSIS — M8949 Other hypertrophic osteoarthropathy, multiple sites: Secondary | ICD-10-CM

## 2021-02-23 MED ORDER — SUMATRIPTAN SUCCINATE 25 MG PO TABS: 25.0000 mg | ORAL_TABLET | Freq: Once | ORAL | 0 refills | Status: DC

## 2021-02-23 MED ORDER — VENLAFAXINE HCL ER 37.5 MG PO CP24: 37.5000 mg | ORAL_CAPSULE | Freq: Every day | ORAL | 0 refills | Status: DC

## 2021-02-23 NOTE — Patient Instructions (Addendum)
   To bring all your prescription bottles with you to your next appt.   STOP LEXAPRO Start Effexor   STOP ALEVE Start Tylenol 500 mg 2 tablets every 8 hours as needed pain.   Can use melatonin 1 mg by mouth at bedtime to also helps with sleep

## 2021-02-23 NOTE — Progress Notes (Signed)
Careteam: Patient Care Team: Lauree Chandler, NP as PCP - General (Geriatric Medicine)  PLACE OF SERVICE:  [place] Directive information Does Patient Have a Medical Advance Directive?: Yes, Type of Advance Directive: Healthcare Power of Attorney  Allergies  Allergen Reactions   Fish Allergy Anaphylaxis   Iodine Anaphylaxis   Sulfur Hives   Elemental Sulfur Hives    Chief Complaint  Patient presents with   Establish Care    New patient to establish care. Patient has concerns about blood pressure.Patient has pain in legs that hurts to touch. Patient has some concerns about lips.Patient would like to discuss depression    HPI: Patient is a 65 y.o. female to establish care.   Htn- elevated today in office, taking amlodipine 10 mg daily. Blood pressure at home. [id number]  Asthma- trelegy in the morning, and uses albuterol PRN (has not needed in over a month).   OA- knees, feet, wrist, neck and spine. Using diclofenac 75 mg BID and aleve. Has tylenol on list   GERD- HH and uses protonix 40 mg by mouth daily   Obesity- using phentermine but has not taken in 7 days.  Knows she needs to lose weight and has a lot of weight she is carrying.  Has a lot of pain in her leg, was starting on mirapex.reports this is affected her sleep.   Vit d def- on vit d supplement weekly.   Depression- on lexapro- has helped since she has been on it, has been on medication for several months.  So HI or SI. Has a lot of thoughts going on in her head.   Reports constant chest pains "grinding in chest" went through a thorough work up but chest pain is constantly at 6.   Migraines- "takes 4 or 5 pills" when she has it. Reports she is having headaches once weekly.  Sensitive to light.   Review of Systems:  Review of Systems  Constitutional:  Negative for chills, fever and weight loss.  HENT:  Positive for congestion. Negative for tinnitus.   Respiratory:  Positive for  shortness of breath. Negative for cough and sputum production.   Cardiovascular:  Positive for leg swelling. Negative for chest pain and palpitations.  Gastrointestinal:  Negative for abdominal pain, constipation, diarrhea and heartburn.  Genitourinary:  Positive for frequency and urgency. Negative for dysuria.  Musculoskeletal:  Positive for back pain and myalgias. Negative for falls and joint pain.  Skin: Negative.   Neurological:  Positive for headaches. Negative for dizziness.  Psychiatric/Behavioral:  Positive for depression. Negative for memory loss. The patient has insomnia.    Past Medical History:  Diagnosis Date   Angina at rest [facility])    Arthritis    Asthma    Depression    GERD (gastroesophageal reflux disease)    Hypertension    Migraines    Obesity    Past Surgical History:  Procedure Laterality Date   ABDOMINAL HYSTERECTOMY     APPENDECTOMY     CHOLECYSTECTOMY     GALLBLADDER SURGERY  1989   Social History:   reports that she quit smoking about 15 years ago. Her smoking use included cigarettes. She smoked an average of 3 packs per day. She has never used smokeless tobacco. She reports that she does not drink alcohol and does not use drugs.  Family History  Problem Relation Age of Onset   Hypertension Mother    Heart disease Mother  3 CABG, stents placements.    Heart attack Mother        late 86s   Cancer Mother    Diabetes Mother    High Cholesterol Mother    Cancer Father    Diabetes Other    Heart disease Other     Medications: Patient's Medications  New Prescriptions   No medications on file  Previous Medications   ACETAMINOPHEN (TYLENOL) 650 MG CR TABLET    Take 1,950 mg by mouth every 6 (six) hours as needed for pain.   ALBUTEROL (PROVENTIL HFA;VENTOLIN HFA) 108 (90 BASE) MCG/ACT INHALER    Inhale 2 puffs into the lungs every 6 (six) hours as needed for wheezing or shortness of breath.   ALBUTEROL IN    Inhale 4 puffs into the lungs 4  (four) times daily as needed.   AMLODIPINE (NORVASC) 10 MG TABLET    Take 10 mg by mouth daily.   DICLOFENAC (VOLTAREN) 75 MG EC TABLET    Take 75 mg by mouth 2 (two) times daily.   ESCITALOPRAM (LEXAPRO) 10 MG TABLET    TAKE 1 TABLET BY MOUTH AT BEDTIME   FLUTICASONE-UMECLIDIN-VILANT (TRELEGY ELLIPTA) 100-62.5-25 MCG/INH AEPB    Inhale 1 puff into the lungs in the morning.   FUROSEMIDE (LASIX) 10 MG/ML SOLUTION    Take 10 mg by mouth at bedtime.   PANTOPRAZOLE (PROTONIX) 40 MG TABLET    Take 40 mg by mouth in the morning.   PHENTERMINE 37.5 MG CAPSULE    Take 37.5 mg by mouth in the morning.   PRAMIPEXOLE (MIRAPEX) 0.25 MG TABLET    Take 0.25 mg by mouth at bedtime.   VITAMIN D, ERGOCALCIFEROL, (DRISDOL) 1.25 MG (50000 UNIT) CAPS CAPSULE    Take 50,000 Units by mouth once a week.  Modified Medications   No medications on file  Discontinued Medications   NAPROXEN SODIUM (ALEVE) 220 MG TABLET    Take 220 mg by mouth 3 (three) times a week.    Physical Exam:  Vitals:   02/23/21 1407  BP: (!) 140/97  Pulse: 84  Temp: 97.8 F (36.6 C)  TempSrc: Temporal  SpO2: 98%  Weight: 296 lb 12.8 oz (134.6 kg)  Height: '5\' 5"'$  (1.651 m)   Body mass index is 49.39 kg/m. Wt Readings from Last 3 Encounters:  02/23/21 296 lb 12.8 oz (134.6 kg)  11/16/17 294 lb (133.4 kg)  03/09/17 291 lb (132 kg)    Physical Exam Constitutional:      General: She is not in acute distress.    Appearance: She is well-developed. She is obese. She is not diaphoretic.  HENT:     Head: Normocephalic and atraumatic.     Mouth/Throat:     Pharynx: No oropharyngeal exudate.  Eyes:     Conjunctiva/sclera: Conjunctivae normal.     Pupils: Pupils are equal, round, and reactive to light.  Cardiovascular:     Rate and Rhythm: Normal rate and regular rhythm.     Heart sounds: Normal heart sounds.  Pulmonary:     Effort: Pulmonary effort is normal.     Breath sounds: Normal breath sounds.  Abdominal:     General:  Abdomen is protuberant. Bowel sounds are normal.     Palpations: Abdomen is soft.  Musculoskeletal:        General: Tenderness (to lower legs) present.     Cervical back: Normal range of motion and neck supple.     Right lower leg: No  edema.     Left lower leg: No edema.  Skin:    General: Skin is warm and dry.  Neurological:     Mental Status: She is alert and oriented to person, place, and time.  Psychiatric:        Mood and Affect: Mood normal.    Labs reviewed: Basic Metabolic Panel: No results for input(s): NA, K, CL, CO2, GLUCOSE, BUN, CREATININE, CALCIUM, MG, PHOS, TSH in the last 8760 hours. Liver Function Tests: No results for input(s): AST, ALT, ALKPHOS, BILITOT, PROT, ALBUMIN in the last 8760 hours. No results for input(s): LIPASE, AMYLASE in the last 8760 hours. No results for input(s): AMMONIA in the last 8760 hours. CBC: No results for input(s): WBC, NEUTROABS, HGB, HCT, MCV, PLT in the last 8760 hours. Lipid Panel: No results for input(s): CHOL, HDL, LDLCALC, TRIG, CHOLHDL, LDLDIRECT in the last 8760 hours. TSH: No results for input(s): TSH in the last 8760 hours. A1C: Lab Results  Component Value Date   HGBA1C 5.5 03/09/2017     Assessment/Plan 1. Morbid obesity ([place]) -education provided on healthy weight loss through increase in physical activity and proper nutrition  - Amb Ref to Medical Weight Management  2. Anxiety -ongoing anxiety and depression. On lexapro at this time however however with chronic pain and migraine. Will change to effexor to see if she has better effects  - venlafaxine XR (EFFEXOR XR) 37.5 MG 24 hr capsule; Take 1 capsule (37.5 mg total) by mouth daily with breakfast.  Dispense: 30 capsule; Refill: 0  3. Depression, recurrent ([place]) -ongoing anxiety and depression. On lexapro at this time however however with chronic pain and migraine. Will change to effexor to see if she has better effects  - venlafaxine XR (EFFEXOR XR) 37.5 MG 24  hr capsule; Take 1 capsule (37.5 mg total) by mouth daily with breakfast.  Dispense: 30 capsule; Refill: 0  4. Migraine with aura and without status migrainosus, not intractable Has used imitrex in the past (rx given at urgent care) with good relief. Will start effexor to hopefully minimize headaches.  - venlafaxine XR (EFFEXOR XR) 37.5 MG 24 hr capsule; Take 1 capsule (37.5 mg total) by mouth daily with breakfast.  Dispense: 30 capsule; Refill: 0 - SUMAtriptan (IMITREX) 25 MG tablet; Take 1 tablet (25 mg total) by mouth once for 1 dose. May repeat in 2 hours if headache persists or recurs.  Dispense: 10 tablet; Refill: 0  5. Asthma, unspecified asthma severity, unspecified whether complicated, unspecified whether persistent -stable at this time.  6. Hypertension, unspecified type -Blood pressure elevated today, but typically well controlled -No changes to medications today  -will have pt continue to monitor home bp goal [id number] -follow metabolic panel  7. Vitamin D deficiency -continue supplement.   8. RLS (restless legs syndrome) -continues on mirapex qhs  9. Primary osteoarthritis involving multiple joints Chronic pain in multiple joints. She is taking PO diclofenac BID with aleve PRN. Educated about adverse effects of NSAIDs and not to use together. She will stop aleve. Also not to use advil, motrin, ibuprofen. Can use tylenol 1000 mg by mouth every 8 hours as needed pain.   Next appt: 2 weeks Catharina Pica K. [place], [facility] [phone]

## 2021-03-05 ENCOUNTER — Telehealth: Payer: Medicare Other | Admitting: Family Medicine

## 2021-03-05 DIAGNOSIS — G43819 Other migraine, intractable, without status migrainosus: Secondary | ICD-10-CM

## 2021-03-05 NOTE — Progress Notes (Signed)
Kristi [name]   Needs to test for COVID and or be seen in her PCP for pain injections for migraine- breakthrough med not working.

## 2021-03-16 ENCOUNTER — Other Ambulatory Visit: Payer: Self-pay

## 2021-03-16 ENCOUNTER — Ambulatory Visit (INDEPENDENT_AMBULATORY_CARE_PROVIDER_SITE_OTHER): Payer: Medicare Other | Admitting: Nurse Practitioner

## 2021-03-16 ENCOUNTER — Encounter: Payer: Self-pay | Admitting: Nurse Practitioner

## 2021-03-16 VITALS — BP 122/82 | HR 94 | Temp 97.6°F | Ht 65.0 in | Wt 325.0 lb

## 2021-03-16 DIAGNOSIS — I10 Essential (primary) hypertension: Secondary | ICD-10-CM | POA: Diagnosis not present

## 2021-03-16 DIAGNOSIS — F419 Anxiety disorder, unspecified: Secondary | ICD-10-CM | POA: Diagnosis not present

## 2021-03-16 DIAGNOSIS — G43109 Migraine with aura, not intractable, without status migrainosus: Secondary | ICD-10-CM

## 2021-03-16 DIAGNOSIS — F339 Major depressive disorder, recurrent, unspecified: Secondary | ICD-10-CM | POA: Diagnosis not present

## 2021-03-16 DIAGNOSIS — E559 Vitamin D deficiency, unspecified: Secondary | ICD-10-CM

## 2021-03-16 DIAGNOSIS — R079 Chest pain, unspecified: Secondary | ICD-10-CM

## 2021-03-16 DIAGNOSIS — Z23 Encounter for immunization: Secondary | ICD-10-CM

## 2021-03-16 DIAGNOSIS — M159 Polyosteoarthritis, unspecified: Secondary | ICD-10-CM

## 2021-03-16 DIAGNOSIS — M8949 Other hypertrophic osteoarthropathy, multiple sites: Secondary | ICD-10-CM | POA: Diagnosis not present

## 2021-03-16 NOTE — Progress Notes (Signed)
Careteam: Patient Care Team: Lauree Chandler, NP as PCP - General (Geriatric Medicine)  PLACE OF SERVICE:  [place]  Advanced Directive information    Allergies  Allergen Reactions   Fish Allergy Anaphylaxis   Iodine Anaphylaxis   Sulfur Hives   Elemental Sulfur Hives    Chief Complaint  Patient presents with   Follow-up    2 week follow-up. Patient states she feels medication is working. Patient c/o leg pain. Patient with sinus and migraine issues that are improving. Patient with b/p reading on her phone.      HPI: Patient is a 65 y.o. female for 2 week follow up.   Effexor was given at last appt with imitrex   She got a bad migraine a few weeks ago. Imitrex did help.  Less frequent headaches.   Reports mood has been better.   OA- using tylenol 500 mg at bedtime and this has been effective. Years ago she saw an orthopedic but not recently.   Sleeping more than 5 hours a night. Melatonin has been beneficial.   Htn- blood pressure better today. Has not had medication today.   Reports she has had chest pain for years. Also with shortness of breath. Has got to the ED due to this but stopped going bc they never found anything. She went to cardiologist and had a stress test.  Did Holter test and did not find anything.  Happens when she is sitting.  She was cleared by the cardiologist at the time. Told her she needed to lose weight.   She has had ongoing chest pain for years. Was worked up in 2018 but ongoing since. Thought to be likely musculoskeletal or anxiety/depression related. She has a strong family hx of heart disease with her mother having CAD.  Review of Systems:  Review of Systems  Constitutional:  Negative for chills, fever and weight loss.  HENT:  Negative for tinnitus.   Respiratory:  Positive for shortness of breath. Negative for cough and sputum production.   Cardiovascular:  Positive for chest pain. Negative for palpitations and leg swelling.   Gastrointestinal:  Negative for abdominal pain, constipation, diarrhea and heartburn.  Genitourinary:  Negative for dysuria, frequency and urgency.  Musculoskeletal:  Negative for back pain, falls, joint pain and myalgias.  Skin: Negative.   Neurological:  Positive for headaches. Negative for dizziness.  Psychiatric/Behavioral:  Positive for depression. Negative for memory loss. The patient is nervous/anxious. The patient does not have insomnia.    Past Medical History:  Diagnosis Date   Angina at rest [facility])    Arthritis    Asthma    Depression    GERD (gastroesophageal reflux disease)    Hypertension    Migraines    Obesity    Past Surgical History:  Procedure Laterality Date   ABDOMINAL HYSTERECTOMY     APPENDECTOMY     CHOLECYSTECTOMY     GALLBLADDER SURGERY  1989   Social History:   reports that she quit smoking about 15 years ago. Her smoking use included cigarettes. She smoked an average of 3 packs per day. She has never used smokeless tobacco. She reports that she does not drink alcohol and does not use drugs.  Family History  Problem Relation Age of Onset   Hypertension Mother    Heart disease Mother        3 CABG, stents placements.    Heart attack Mother        late 63s  Cancer Mother    Diabetes Mother    High Cholesterol Mother    Cancer Father    Diabetes Other    Heart disease Other     Medications: Patient's Medications  New Prescriptions   No medications on file  Previous Medications   ACETAMINOPHEN (TYLENOL) 650 MG CR TABLET    Take 1,950 mg by mouth every 6 (six) hours as needed for pain.   ALBUTEROL (PROVENTIL HFA;VENTOLIN HFA) 108 (90 BASE) MCG/ACT INHALER    Inhale 2 puffs into the lungs every 6 (six) hours as needed for wheezing or shortness of breath.   ALBUTEROL IN    Inhale 4 puffs into the lungs 4 (four) times daily as needed.   AMLODIPINE (NORVASC) 10 MG TABLET    Take 10 mg by mouth daily.   DICLOFENAC (VOLTAREN) 75 MG EC TABLET     Take 75 mg by mouth 2 (two) times daily.   FLUTICASONE-UMECLIDIN-VILANT (TRELEGY ELLIPTA) 100-62.5-25 MCG/INH AEPB    Inhale 1 puff into the lungs in the morning.   FUROSEMIDE (LASIX) 10 MG/ML SOLUTION    Take 10 mg by mouth at bedtime.   PANTOPRAZOLE (PROTONIX) 40 MG TABLET    Take 40 mg by mouth in the morning.   PRAMIPEXOLE (MIRAPEX) 0.25 MG TABLET    Take 0.25 mg by mouth at bedtime.   SUMATRIPTAN (IMITREX) 25 MG TABLET    Take 1 tablet (25 mg total) by mouth once for 1 dose. May repeat in 2 hours if headache persists or recurs.   VENLAFAXINE XR (EFFEXOR XR) 37.5 MG 24 HR CAPSULE    Take 1 capsule (37.5 mg total) by mouth daily with breakfast.   VITAMIN D, ERGOCALCIFEROL, (DRISDOL) 1.25 MG (50000 UNIT) CAPS CAPSULE    Take 50,000 Units by mouth once a week.  Modified Medications   No medications on file  Discontinued Medications   No medications on file    Physical Exam:  Vitals:   03/16/21 1141  BP: 122/82  Pulse: 94  Temp: 97.6 F (36.4 C)  TempSrc: Temporal  SpO2: 96%  Weight: (!) 325 lb (147.4 kg)  Height: '5\' 5"'  (1.651 m)   Body mass index is 54.08 kg/m. Wt Readings from Last 3 Encounters:  03/16/21 (!) 325 lb (147.4 kg)  02/23/21 296 lb 12.8 oz (134.6 kg)  11/16/17 294 lb (133.4 kg)    Physical Exam Constitutional:      General: She is not in acute distress.    Appearance: She is well-developed. She is obese. She is not diaphoretic.  HENT:     Head: Normocephalic and atraumatic.     Mouth/Throat:     Pharynx: No oropharyngeal exudate.  Eyes:     Conjunctiva/sclera: Conjunctivae normal.     Pupils: Pupils are equal, round, and reactive to light.  Cardiovascular:     Rate and Rhythm: Normal rate and regular rhythm.     Heart sounds: Normal heart sounds.  Pulmonary:     Effort: Pulmonary effort is normal.     Breath sounds: Normal breath sounds.  Abdominal:     General: Bowel sounds are normal.     Palpations: Abdomen is soft.  Musculoskeletal:      Cervical back: Normal range of motion and neck supple.     Right lower leg: No edema.     Left lower leg: No edema.  Skin:    General: Skin is warm and dry.  Neurological:     Mental Status: She  is alert.  Psychiatric:        Mood and Affect: Mood normal.    Labs reviewed: Basic Metabolic Panel: No results for input(s): NA, K, CL, CO2, GLUCOSE, BUN, CREATININE, CALCIUM, MG, PHOS, TSH in the last 8760 hours. Liver Function Tests: No results for input(s): AST, ALT, ALKPHOS, BILITOT, PROT, ALBUMIN in the last 8760 hours. No results for input(s): LIPASE, AMYLASE in the last 8760 hours. No results for input(s): AMMONIA in the last 8760 hours. CBC: No results for input(s): WBC, NEUTROABS, HGB, HCT, MCV, PLT in the last 8760 hours. Lipid Panel: No results for input(s): CHOL, HDL, LDLCALC, TRIG, CHOLHDL, LDLDIRECT in the last 8760 hours. TSH: No results for input(s): TSH in the last 8760 hours. A1C: Lab Results  Component Value Date   HGBA1C 5.5 03/09/2017     Assessment/Plan 1. Need for influenza vaccination - Flu Vaccine QUAD High Dose([facility])  2. Morbid obesity ([place]) She feels like last time reading was not correct. She states when she took at home wt was 325 lbs which is more consistent as today. --education provided on healthy weight loss through increase in physical activity and proper nutrition  -referral to weight management has been placed. - Lipid panel - CMP with eGFR([facility])  3. Anxiety -stable, reports improvement with effexor  4. Depression, recurrent ([place]) -stable, reports improvement with effexor. Will continue at this time.   5. Migraine with aura and without status migrainosus, not intractable Doing well at this time. Using imitrex PRN. Continues on effexcer to help lessen frequency of migraines.   6. Vitamin D deficiency -continues on supplement. - Vitamin D, 25-hydroxy  7. Primary osteoarthritis involving multiple joints -continue tylenol PRN -PT for  evaluation and treatment  - CMP with eGFR([facility])  8. Hypertension, unspecified type -well controlled on current regimne.  - CMP with eGFR([facility]) - CBC with Differential/Platelet  9. Chest pain, unspecified type -ongoing chest pain for years. Full work up in 2018 without acute findings however she continues to have pain in her chest and reports shortness of breath with this. She has a strong family hx of CAD - Ambulatory referral to Cardiology - EKG 12-Lead-normal sinus, rate 86   Next appt: 3 months  Sanyia Dini K. Sasser, [facility] [phone]

## 2021-03-17 ENCOUNTER — Other Ambulatory Visit: Payer: Self-pay

## 2021-03-17 ENCOUNTER — Other Ambulatory Visit: Payer: Self-pay | Admitting: Nurse Practitioner

## 2021-03-17 DIAGNOSIS — F419 Anxiety disorder, unspecified: Secondary | ICD-10-CM

## 2021-03-17 DIAGNOSIS — G43109 Migraine with aura, not intractable, without status migrainosus: Secondary | ICD-10-CM

## 2021-03-17 DIAGNOSIS — F339 Major depressive disorder, recurrent, unspecified: Secondary | ICD-10-CM

## 2021-03-17 LAB — COMPLETE METABOLIC PANEL WITH GFR
AG Ratio: 1.4 (calc) (ref 1.0–2.5)
ALT: 12 U/L (ref 6–29)
AST: 15 U/L (ref 10–35)
Albumin: 4 g/dL (ref 3.6–5.1)
Alkaline phosphatase (APISO): 63 U/L (ref 37–153)
BUN: 13 mg/dL (ref 7–25)
CO2: 30 mmol/L (ref 20–32)
Calcium: 9 mg/dL (ref 8.6–10.4)
Chloride: 103 mmol/L (ref 98–110)
Creat: 0.78 mg/dL (ref 0.50–1.05)
Globulin: 2.9 g/dL (calc) (ref 1.9–3.7)
Glucose, Bld: 83 mg/dL (ref 65–99)
Potassium: 4.6 mmol/L (ref 3.5–5.3)
Sodium: 141 mmol/L (ref 135–146)
Total Bilirubin: 0.5 mg/dL (ref 0.2–1.2)
Total Protein: 6.9 g/dL (ref 6.1–8.1)
eGFR: 84 mL/min/{1.73_m2} (ref >=60)

## 2021-03-17 LAB — CBC WITH DIFFERENTIAL/PLATELET
Absolute Monocytes: 517 cells/uL (ref 200–950)
Basophils Absolute: 32 cells/uL (ref 0–200)
Basophils Relative: 0.5 %
Eosinophils Absolute: 132 cells/uL (ref 15–500)
Eosinophils Relative: 2.1 %
HCT: 40.3 % (ref 35.0–45.0)
Hemoglobin: 13.2 g/dL (ref 11.7–15.5)
Lymphs Abs: 1789 cells/uL (ref 850–3900)
MCH: 26.7 pg — ABNORMAL LOW (ref 27.0–33.0)
MCHC: 32.8 g/dL (ref 32.0–36.0)
MCV: 81.6 fL (ref 80.0–100.0)
MPV: 9.9 fL (ref 7.5–12.5)
Monocytes Relative: 8.2 %
Neutro Abs: 3830 cells/uL (ref 1500–7800)
Neutrophils Relative %: 60.8 %
Platelets: 341 10*3/uL (ref 140–400)
RBC: 4.94 10*6/uL (ref 3.80–5.10)
RDW: 13.7 % (ref 11.0–15.0)
Total Lymphocyte: 28.4 %
WBC: 6.3 10*3/uL (ref 3.8–10.8)

## 2021-03-17 LAB — LIPID PANEL
Cholesterol: 243 mg/dL — ABNORMAL HIGH (ref <200)
HDL: 57 mg/dL (ref >=50)
LDL Cholesterol (Calc): 163 mg/dL (calc) — ABNORMAL HIGH
Non-HDL Cholesterol (Calc): 186 mg/dL (calc) — ABNORMAL HIGH (ref <130)
Total CHOL/HDL Ratio: 4.3 (calc) (ref <5.0)
Triglycerides: 114 mg/dL (ref <150)

## 2021-03-17 LAB — VITAMIN D 25 HYDROXY (VIT D DEFICIENCY, FRACTURES): Vit D, 25-Hydroxy: 49 ng/mL (ref 30–100)

## 2021-03-17 MED ORDER — ATORVASTATIN CALCIUM 10 MG PO TABS: 10.0000 mg | ORAL_TABLET | Freq: Every day | ORAL | 5 refills | Status: DC

## 2021-03-22 ENCOUNTER — Other Ambulatory Visit: Payer: Self-pay | Admitting: Nurse Practitioner

## 2021-03-22 DIAGNOSIS — G43109 Migraine with aura, not intractable, without status migrainosus: Secondary | ICD-10-CM

## 2021-03-23 NOTE — Telephone Encounter (Signed)
Pharmacy requested refill.  Pended Rx and sent to Jessica for approval.  

## 2021-04-06 ENCOUNTER — Other Ambulatory Visit: Payer: Self-pay

## 2021-04-06 ENCOUNTER — Ambulatory Visit: Payer: Medicare Other | Attending: Nurse Practitioner

## 2021-04-06 VITALS — BP 99/80

## 2021-04-06 DIAGNOSIS — R2689 Other abnormalities of gait and mobility: Secondary | ICD-10-CM | POA: Insufficient documentation

## 2021-04-06 DIAGNOSIS — M6281 Muscle weakness (generalized): Secondary | ICD-10-CM | POA: Insufficient documentation

## 2021-04-06 DIAGNOSIS — G8929 Other chronic pain: Secondary | ICD-10-CM | POA: Insufficient documentation

## 2021-04-06 DIAGNOSIS — M545 Low back pain, unspecified: Secondary | ICD-10-CM | POA: Insufficient documentation

## 2021-04-06 DIAGNOSIS — M79605 Pain in left leg: Secondary | ICD-10-CM | POA: Diagnosis not present

## 2021-04-06 DIAGNOSIS — M79604 Pain in right leg: Secondary | ICD-10-CM | POA: Diagnosis not present

## 2021-04-06 NOTE — Therapy (Signed)
[place], [place], [address] Phone: [phone]   Fax:  [phone]  Physical Therapy Evaluation  Patient Details  Name: Kristi [name] MRN: [id number] Date of Birth: Oct 07, 1955 Referring Provider (PT): Lauree Chandler, NP   Encounter Date: 04/06/2021   PT End of Session - 04/06/21 2136     Visit Number 1    Number of Visits 17    Date for PT Re-Evaluation 06/01/21    Authorization Type UHC MCR    PT Start Time 9678    PT Stop Time 1614    PT Time Calculation (min) 44 min    Activity Tolerance Patient tolerated treatment well    Behavior During Therapy [facility] for tasks assessed/performed             Past Medical History:  Diagnosis Date   Angina at rest [facility])    Arthritis    Asthma    Depression    GERD (gastroesophageal reflux disease)    Hypertension    Migraines    Obesity     Past Surgical History:  Procedure Laterality Date   ABDOMINAL HYSTERECTOMY     APPENDECTOMY     CHOLECYSTECTOMY     GALLBLADDER SURGERY  1989    Vitals:   04/06/21 1534  BP: 99/80      Subjective Assessment - 04/06/21 1534     Subjective Pt presents to PT with reports of multi-joint pain and discomfort. She notes that she has had costo-chondritis since age 65, along with scoliosis and thoracic/lumbar pain. Also promotes some L lateral knee pain, distal LE pain that increases with with even light touch. Has symptoms of weakness and occasionally tingling down each LE with prolonged standing and walking. Has symptoms of angina at rest as well as symptoms of orthostatic hypotension when rising from sitting to standing. Knows to wait for this to decrease before movement. Also notes that she occasionally has trouble with hygeine d/t pain in R shoulder, especially reaching around her back for perianal care.    Limitations Standing;Walking;House hold activities;Lifting    How long can you sit comfortably? 60 min     How long can you stand comfortably? 1-2 min    How long can you walk comfortably? 1-2 min    Patient Stated Goals Pt would like to decrease pain in multiple areas in order to get back to walking recreationally    Currently in Pain? Yes   10/10 at worst   Pain Score 7     Pain Location Back    Pain Orientation Lower    Pain Descriptors / Indicators Sore    Pain Type Chronic pain    Pain Onset More than a month ago    Pain Frequency Intermittent    Aggravating Factors  standing, walking    Pain Relieving Factors rest    Multiple Pain Sites Yes    Pain Score 8    Pain Location Knee    Pain Orientation Left;Lateral    Pain Descriptors / Indicators Sharp    Pain Type Chronic pain    Pain Onset More than a month ago    Pain Frequency Constant    Aggravating Factors  standing, walking    Pain Relieving Factors rest                [facility] PT Assessment - 04/06/21 0001       Assessment   Medical Diagnosis M89.49 (ICD-10-CM) - Primary osteoarthritis  involving multiple joints    Referring Provider (PT) Dewaine Oats, Carlos American, NP    Hand Dominance Right    Prior Therapy None      Precautions   Precautions None      Restrictions   Weight Bearing Restrictions No      Balance Screen   Has the patient fallen in the past 6 months No    Has the patient had a decrease in activity level because of a fear of falling?  Yes    Is the patient reluctant to leave their home because of a fear of falling?  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)      Cognition   Overall Cognitive Status Within Functional Limits for tasks assessed    Attention Focused      Observation/Other Assessments   Focus on Therapeutic Outcomes (FOTO)  40% function; 50% predicted      Sensation   Light Touch Appears Intact      Functional Tests   Functional tests Sit to Stand      Sit to Stand   Comments 30 Sec STS: 3 reps      Posture/Postural Control   Posture Comments large body habitus; increased lumbar lordosis      Strength   Right Hip Flexion 3+/5    Right Hip ABduction 3/5    Right Hip ADduction 3/5    Left Hip Flexion 2+/5    Left Hip ABduction 3/5    Left Hip ADduction 2+/5    Right Knee Flexion 4/5    Right Knee Extension 4/5    Left Knee Flexion 4/5    Left Knee Extension 4/5      Standardized Balance Assessment   Standardized Balance Assessment Timed Up and Go Test      Timed Up and Go Test   Normal TUG (seconds) 17                        Objective measurements completed on examination: See above findings.                PT Education - 04/06/21 2135     Education Details eval findings, FOTO, HEP, POC    Person(s) Educated Patient    Methods Explanation;Demonstration;Handout    Comprehension Verbalized understanding;Returned demonstration              PT Short Term Goals - 04/06/21 2136       PT SHORT TERM GOAL #1   Title Pt will be compliant with initial HEP for improved carryover between session    Baseline initial HEP given    Time 3    Period Weeks    Status New    Target Date 04/27/21               PT Long Term  Goals - 04/06/21 2137       PT LONG TERM GOAL #1   Title Pt will improve FOTO score to no less than 54% as proxy for functional improvement    Baseline 40% function    Time 8    Period Weeks    Status New    Target Date 06/01/21      PT LONG TERM GOAL #2   Title Pt will increase 30  Sec STS to no less than 6 reps for improved comfort and functional mobility    Baseline 3 reps    Time 8    Period Weeks    Status New    Target Date 06/01/21      PT LONG TERM GOAL #3   Title Pt will decrease TUG to no greater than 12 seconds for improved balance and mobility    Baseline 17 seconds    Time 8    Period Weeks    Status New    Target Date 06/01/21      PT LONG TERM GOAL #4   Title Pt will self report generalized pain no greater than 3/10 at worst for improved comfort and function    Baseline 10/10 at worst in multiple joints    Time 8    Period Weeks    Status New    Target Date 06/01/21                    Plan - 04/06/21 2146     Clinical Impression Statement Pt is a 65 y/o F who presents to PT with reports of multi-joint pain, decreased functional endurance, and fear of falling. Physical findings are consistent with referring provider impression, as pt demonstrates 30 Sec STS and TUG measures that place her at risk for falls and reduced functional mobility. She is also limited by possible cardiovascular impairments, with symptoms of angina at rest that will need to be monitored throughout PT treatment. She would benefit from skilled PT services working on improving general LE strength, cardiovascular endurance, and balance in order to decrease pain while improving functional and mobility.    Personal Factors and Comorbidities Comorbidity 3+;Fitness;Time since onset of injury/illness/exacerbation    Comorbidities PMH: HTN, Angina, asthma    Examination-Activity Limitations Lift;Squat;Stairs;Stand;Toileting;Sit;Locomotion Level;Hygiene/Grooming    Examination-Participation Restrictions Laundry;Community Activity;Cleaning;Yard Work;Interpersonal Relationship    Stability/Clinical Decision Making Evolving/Moderate complexity    Clinical Decision Making Moderate    Rehab Potential Good    PT Frequency 2x / week    PT Duration 8 weeks    PT  Treatment/Interventions ADLs/Self Care Home Management;Aquatic Therapy;Cryotherapy;Moist Heat;Gait training;Stair training;Functional mobility training;Therapeutic activities;Therapeutic exercise;Balance training;Neuromuscular re-education;Patient/family education;Manual techniques;Passive range of motion;Dry needling;Taping;Vasopneumatic Device    PT Next Visit Plan assess response to HEP, balance assessment with SLS, progress LE strength    PT Home Exercise Plan Access Code: [email or web address]    Consulted and Agree with Plan of Care Patient             Patient will benefit from skilled therapeutic intervention in order to improve the following deficits and impairments:  Abnormal gait, Cardiopulmonary status limiting activity, Decreased activity tolerance, Decreased balance, Decreased endurance, Decreased mobility, Decreased range of motion, Decreased strength, Difficulty walking, Dizziness, Postural dysfunction, Improper body mechanics, Obesity, Pain  Visit Diagnosis: Muscle weakness (generalized) - Plan: PT plan of care cert/re-cert  Chronic low back pain, unspecified back pain laterality, unspecified whether sciatica present - Plan: PT plan of care cert/re-cert  Other abnormalities  of gait and mobility - Plan: PT plan of care cert/re-cert  Pain in left leg - Plan: PT plan of care cert/re-cert  Pain in right leg - Plan: PT plan of care cert/re-cert     Problem List Patient Active Problem List   Diagnosis Date Noted   Chest pain 02/24/2017   Atypical chest pain 05/10/2016   HTN (hypertension) 05/10/2016   Hypertensive emergency 05/10/2016   Anxiety 05/10/2016   Gastroesophageal reflux disease with esophagitis     Ward Chatters, PT 04/06/2021, 9:55 PM  [facility] [address] [place], [place], [address] Phone: [phone]   Fax:  [phone]  Name: Jonasia Coiner MRN: [id number] Date of Birth: [birth year]

## 2021-04-09 ENCOUNTER — Ambulatory Visit (INDEPENDENT_AMBULATORY_CARE_PROVIDER_SITE_OTHER): Payer: Medicare Other | Admitting: Nurse Practitioner

## 2021-04-09 ENCOUNTER — Encounter: Payer: Self-pay | Admitting: Nurse Practitioner

## 2021-04-09 ENCOUNTER — Other Ambulatory Visit: Payer: Self-pay | Admitting: Nurse Practitioner

## 2021-04-09 ENCOUNTER — Other Ambulatory Visit: Payer: Self-pay

## 2021-04-09 DIAGNOSIS — E2839 Other primary ovarian failure: Secondary | ICD-10-CM

## 2021-04-09 DIAGNOSIS — Z0000 Encounter for general adult medical examination without abnormal findings: Secondary | ICD-10-CM | POA: Diagnosis not present

## 2021-04-09 DIAGNOSIS — Z1231 Encounter for screening mammogram for malignant neoplasm of breast: Secondary | ICD-10-CM

## 2021-04-09 MED ORDER — PANTOPRAZOLE SODIUM 40 MG PO TBEC: 40.0000 mg | DELAYED_RELEASE_TABLET | Freq: Every morning | ORAL | 5 refills | Status: DC

## 2021-04-09 NOTE — Progress Notes (Signed)
Subjective:   Kristi [name] is a 65 y.o. female who presents for Medicare Annual (Subsequent) preventive examination.  Review of Systems     Cardiac Risk Factors include: advanced age (>41men, >72 women);obesity (BMI >30kg/m2);family history of premature cardiovascular disease;sedentary lifestyle;hypertension;dyslipidemia;smoking/ tobacco exposure     Objective:    Today's Vitals   04/09/21 1019  PainSc: 7    There is no height or weight on file to calculate BMI.  Advanced Directives 04/09/2021 04/06/2021 02/23/2021 11/16/2017 02/25/2017 05/10/2016 12/25/2014  Does Patient Have a Medical Advance Directive? Yes No Yes No No No No  Type of Advance Directive Healthcare Power of [place] in Chart? No - copy requested - No - copy requested - - - -  Would patient like information on creating a medical advance directive? - No - Patient declined - No - Patient declined No - Patient declined No - patient declined information No - patient declined information    Current Medications (verified) Outpatient Encounter Medications as of 04/09/2021  Medication Sig   acetaminophen (TYLENOL) 650 MG CR tablet Take 1,950 mg by mouth every 6 (six) hours as needed for pain.   albuterol (PROVENTIL HFA;VENTOLIN HFA) 108 (90 Base) MCG/ACT inhaler Inhale 2 puffs into the lungs every 6 (six) hours as needed for wheezing or shortness of breath.   amLODipine (NORVASC) 10 MG tablet Take 10 mg by mouth daily.   atorvastatin (LIPITOR) 10 MG tablet Take 1 tablet (10 mg total) by mouth daily.   diclofenac (VOLTAREN) 75 MG EC tablet Take 75 mg by mouth 2 (two) times daily.   Fluticasone-Umeclidin-Vilant (TRELEGY ELLIPTA) 100-62.5-25 MCG/INH AEPB Inhale 1 puff into the lungs in the morning.   Melatonin 10 MG TABS Take 1 tablet by mouth daily at 12 noon.   pramipexole (MIRAPEX) 0.25 MG tablet Take 0.25 mg by mouth at bedtime.   SUMAtriptan  (IMITREX) 25 MG tablet TAKE 1 TABLET BY MOUTH EVERYDAY, MAY REPEAT IN 2 HOURS IF HEADACHE PERSISTS OR RECURS   venlafaxine XR (EFFEXOR-XR) 37.5 MG 24 hr capsule Take 1 capsule (37.5 mg total) by mouth daily with breakfast. DX F41.9, F33.9, G43.109   Vitamin D, Ergocalciferol, (DRISDOL) 1.25 MG (50000 UNIT) CAPS capsule Take 50,000 Units by mouth once a week.   [DISCONTINUED] pantoprazole (PROTONIX) 40 MG tablet Take 40 mg by mouth in the morning.   ALBUTEROL IN Inhale 4 puffs into the lungs 4 (four) times daily as needed.   pantoprazole (PROTONIX) 40 MG tablet Take 1 tablet (40 mg total) by mouth in the morning.   No facility-administered encounter medications on file as of 04/09/2021.    Allergies (verified) Fish allergy, Iodine, Sulfur, and Elemental sulfur   History: Past Medical History:  Diagnosis Date   Angina at rest [facility])    Arthritis    Asthma    Depression    GERD (gastroesophageal reflux disease)    Hypertension    Migraines    Obesity    Past Surgical History:  Procedure Laterality Date   ABDOMINAL HYSTERECTOMY     APPENDECTOMY     CHOLECYSTECTOMY     GALLBLADDER SURGERY  1989   Family History  Problem Relation Age of Onset   Hypertension Mother    Heart disease Mother        3 CABG, stents placements.    Heart attack Mother        late 44s  Cancer Mother    Diabetes Mother    High Cholesterol Mother    Cancer Father    Diabetes Other    Heart disease Other    Social History   Socioeconomic History   Marital status: Divorced    Spouse name: Not on file   Number of children: Not on file   Years of education: Not on file   Highest education level: Not on file  Occupational History   Not on file  Tobacco Use   Smoking status: Former    Packs/day: 3.00    Types: Cigarettes    Quit date: 02/27/2006    Years since quitting: 15.1   Smokeless tobacco: Never  Vaping Use   Vaping Use: Never used  Substance and Sexual Activity   Alcohol use: No    Drug use: No   Sexual activity: Not on file  Other Topics Concern   Not on file  Social History Narrative   Tobacco use, amount per day now:   Past tobacco use, amount per day: 3 packs per day, stopped 02/27/2006   How many years did you use tobacco: 25 years   Alcohol use (drinks per week): None   Diet: Low salt, Low fat.   Do you drink/eat things with caffeine: Coffee   Marital status:  Divorced                                What year were you married?    Do you live in a house, apartment, assisted living, condo, trailer, etc.? House   Is it one or more stories?   How many persons live in your home? 3   Do you have pets in your home?( please list) Dog, and Cat   Highest Level of education completed? Bachelors.   Current or past profession: Radiation protection practitioner.   Do you exercise?   No                               Type and how often?   Do you have a living will? No   Do you have a DNR form?     No                               If not, do you want to discuss one?   Do you have signed POA/HPOA forms? No                        If so, please bring to you appointment      Do you have any difficulty bathing or dressing yourself? No    Do you have any difficulty preparing food or eating? No   Do you have any difficulty managing your medications? No   Do you have any difficulty managing your finances? No   Do you have any difficulty affording your medications? Yes   Social Determinants of Health   Financial Resource Strain: Not on file  Food Insecurity: Not on file  Transportation Needs: Not on file  Physical Activity: Not on file  Stress: Not on file  Social Connections: Not on file    Tobacco Counseling Counseling given: Not Answered   Clinical Intake:  Pre-visit preparation completed: Yes  Pain : 0-10 Pain Score: 7  Pain  Type: Chronic pain Pain Location: Back (chronic pain in back and chest.) Pain Orientation: Lower Pain Onset: More than a month ago Pain  Frequency: Constant     BMI - recorded: 54 Nutritional Status: BMI > 30  Obese Nutritional Risks: None Diabetes: No  How often do you need to have someone help you when you read instructions, pamphlets, or other written materials from your doctor or pharmacy?: 1 - Never  Diabetic?no         Activities of Daily Living In your present state of health, do you have any difficulty performing the following activities: 04/09/2021  Hearing? N  Vision? N  Difficulty concentrating or making decisions? N  Walking or climbing stairs? Y  Dressing or bathing? N  Doing errands, shopping? N  Preparing Food and eating ? N  Using the Toilet? N  In the past six months, have you accidently leaked urine? Y  Do you have problems with loss of bowel control? N  Managing your Medications? N  Managing your Finances? N  Housekeeping or managing your Housekeeping? Y  Comment due to debility  Some recent data might be hidden    Patient Care Team: Lauree Chandler, NP as PCP - General (Geriatric Medicine)  Indicate any recent Medical Services you may have received from other than [facility] providers in the past year (date may be approximate).     Assessment:   This is a routine wellness examination for [place].  Hearing/Vision screen Hearing Screening - Comments:: Patient has no hearing problems Vision Screening - Comments:: Patient wears glasses. Patient had eye exam about 2 months ago. Patient goes to [employer] issues and exercise activities discussed: Current Exercise Habits: The patient does not participate in regular exercise at present   Goals Addressed   None    Depression Screen PHQ 2/9 Scores 04/09/2021 02/23/2021 03/09/2017  PHQ - 2 Score 0 4 5  PHQ- 9 Score - 18 20    Fall Risk Fall Risk  04/09/2021 02/23/2021 03/09/2017  Falls in the past year? 0 0 No  Number falls in past yr: 0 0 -  Injury with Fall? 0 0 -  Risk for fall due to : No Fall Risks No Fall Risks -   Follow up Falls evaluation completed Falls evaluation completed -    FALL RISK PREVENTION PERTAINING TO THE HOME:  Any stairs in or around the home? Yes  If so, are there any without handrails? No  Home free of loose throw rugs in walkways, pet beds, electrical cords, etc? Yes  Adequate lighting in your home to reduce risk of falls? Yes   ASSISTIVE DEVICES UTILIZED TO PREVENT FALLS:  Life alert? No  Use of a cane, walker or w/c? Yes  Grab bars in the bathroom? No  Shower chair or bench in shower? No  Elevated toilet seat or a handicapped toilet? No   TIMED UP AND GO:  Was the test performed? No .    Cognitive Function:     6CIT Screen 04/09/2021  What Year? 0 points  What month? 0 points  What time? 0 points  Count back from 20 0 points  Months in reverse 0 points  Repeat phrase 2 points  Total Score 2    Immunizations Immunization History  Administered Date(s) Administered   Fluad Quad(high Dose 65+) 03/16/2021   Influenza,inj,Quad PF,6+ Mos 05/11/2016, 03/09/2017   Influenza-Unspecified 03/09/2017, 06/18/2020   Moderna Sars-Covid-2 Vaccination 11/23/2019, 12/21/2019, 09/29/2020   Tdap 03/09/2017  Zoster Recombinat (Shingrix) 09/29/2020, 12/01/2020    TDAP status: Up to date  Flu Vaccine status: Up to date  Pneumococcal vaccine status: Due, Education has been provided regarding the importance of this vaccine. Advised may receive this vaccine at local pharmacy or Health Dept. Aware to provide a copy of the vaccination record if obtained from local pharmacy or Health Dept. Verbalized acceptance and understanding.  Covid-19 vaccine status: Information provided on how to obtain vaccines.   Qualifies for Shingles Vaccine? Yes   Zostavax completed No   Shingrix Completed?: Yes  Screening Tests Health Maintenance  Topic Date Due   Hepatitis C Screening  Never done   COLONOSCOPY (Pts 45-40yrs Insurance coverage will need to be confirmed)  Never done    MAMMOGRAM  08/24/2014   DEXA SCAN  Never done   COVID-19 Vaccine (4 - Booster for Moderna series) 01/29/2021   TETANUS/TDAP  03/10/2027   INFLUENZA VACCINE  Completed   Zoster Vaccines- Shingrix  Completed   HPV VACCINES  Aged Out    Health Maintenance  Health Maintenance Due  Topic Date Due   Hepatitis C Screening  Never done   COLONOSCOPY (Pts 45-47yrs Insurance coverage will need to be confirmed)  Never done   MAMMOGRAM  08/24/2014   DEXA SCAN  Never done   COVID-19 Vaccine (4 - Booster for Moderna series) 01/29/2021    Colorectal cancer screening: Referral to GI placed today. Pt aware the office will call re: appt.  Mammogram status: Ordered AND ENCOURAGED TO SCHEDULED. Pt provided with contact info and advised to call to schedule appt.   Bone Density status: Ordered TODAY. Pt provided with contact info and advised to call to schedule appt.  Lung Cancer Screening: (Low Dose CT Chest recommended if Age 41-80 years, 30 pack-year currently smoking OR have quit w/in 15years.) does not qualify.   Lung Cancer Screening Referral: NA  Additional Screening:  Hepatitis C Screening: does qualify; Completed   Vision Screening: Recommended annual ophthalmology exams for early detection of glaucoma and other disorders of the eye. Is the patient up to date with their annual eye exam?  Yes  Who is the provider or what is the name of the office in which the patient attends annual eye exams? [facility] If pt is not established with a provider, would they like to be referred to a provider to establish care? No .   Dental Screening: Recommended annual dental exams for proper oral hygiene  Community Resource Referral / Chronic Care Management: CRR required this visit?  No   CCM required this visit?  No      Plan:     I have personally reviewed and noted the following in the patient's chart:   Medical and social history Use of alcohol, tobacco or illicit drugs  Current  medications and supplements including opioid prescriptions.  Functional ability and status Nutritional status Physical activity Advanced directives List of other physicians Hospitalizations, surgeries, and ER visits in previous 12 months Vitals Screenings to include cognitive, depression, and falls Referrals and appointments  In addition, I have reviewed and discussed with patient certain preventive protocols, quality metrics, and best practice recommendations. A written personalized care plan for preventive services as well as general preventive health recommendations were provided to patient.     Lauree Chandler, NP   04/09/2021    Virtual Visit via Telephone Note  I connected withNAME@ on 04/09/21 at 10:00 AM EDT by telephone and verified that I am  speaking with the correct person using two identifiers.  Location: Patient: home Provider: [facility]   I discussed the limitations, risks, security and privacy concerns of performing an evaluation and management service by telephone and the availability of in person appointments. I also discussed with the patient that there may be a patient responsible charge related to this service. The patient expressed understanding and agreed to proceed.   I discussed the assessment and treatment plan with the patient. The patient was provided an opportunity to ask questions and all were answered. The patient agreed with the plan and demonstrated an understanding of the instructions.   The patient was advised to call back or seek an in-person evaluation if the symptoms worsen or if the condition fails to improve as anticipated.  I provided 15 minutes of non-face-to-face time during this encounter.  Carlos American. Harle Battiest Avs printed and mailed

## 2021-04-09 NOTE — Patient Instructions (Signed)
Kristi [name] , Thank you for taking time to come for your Medicare Wellness Visit. I appreciate your ongoing commitment to your health goals. Please review the following plan we discussed and let me know if I can assist you in the future.   Screening recommendations/referrals: Colonoscopy referral placed Mammogram please call to schedule [phone] Bone Density please call to schedule- [phone] Recommended yearly ophthalmology/optometry visit for glaucoma screening and checkup Recommended yearly dental visit for hygiene and checkup  Vaccinations: Influenza vaccine up to date Pneumococcal vaccine - RECOMMENDED- to get at local pharmacy or office Tdap vaccine up to date Shingles vaccine up date COVID- booster at pharmacy    Advanced directives: copy requested to place on file.   Conditions/risks identified: morbid obesity, family hx of heart disease, hyperlipidemia, hypertension.   Next appointment: yearly for awv   Preventive Care 88 Years and Older, Female Preventive care refers to lifestyle choices and visits with your health care provider that can promote health and wellness. What does preventive care include? A yearly physical exam. This is also called an annual well check. Dental exams once or twice a year. Routine eye exams. Ask your health care provider how often you should have your eyes checked. Personal lifestyle choices, including: Daily care of your teeth and gums. Regular physical activity. Eating a healthy diet. Avoiding tobacco and drug use. Limiting alcohol use. Practicing safe sex. Taking low-dose aspirin every day. Taking vitamin and mineral supplements as recommended by your health care provider. What happens during an annual well check? The services and screenings done by your health care provider during your annual well check will depend on your age, overall health, lifestyle risk factors, and family history of disease. Counseling  Your health care  provider may ask you questions about your: Alcohol use. Tobacco use. Drug use. Emotional well-being. Home and relationship well-being. Sexual activity. Eating habits. History of falls. Memory and ability to understand (cognition). Work and work Statistician. Reproductive health. Screening  You may have the following tests or measurements: Height, weight, and BMI. Blood pressure. Lipid and cholesterol levels. These may be checked every 5 years, or more frequently if you are over 23 years old. Skin check. Lung cancer screening. You may have this screening every year starting at age 61 if you have a 30-pack-year history of smoking and currently smoke or have quit within the past 15 years. Fecal occult blood test (FOBT) of the stool. You may have this test every year starting at age 69. Flexible sigmoidoscopy or colonoscopy. You may have a sigmoidoscopy every 5 years or a colonoscopy every 10 years starting at age 15. Hepatitis C blood test. Hepatitis B blood test. Sexually transmitted disease (STD) testing. Diabetes screening. This is done by checking your blood sugar (glucose) after you have not eaten for a while (fasting). You may have this done every 1-3 years. Bone density scan. This is done to screen for osteoporosis. You may have this done starting at age 49. Mammogram. This may be done every 1-2 years. Talk to your health care provider about how often you should have regular mammograms. Talk with your health care provider about your test results, treatment options, and if necessary, the need for more tests. Vaccines  Your health care provider may recommend certain vaccines, such as: Influenza vaccine. This is recommended every year. Tetanus, diphtheria, and acellular pertussis (Tdap, Td) vaccine. You may need a Td booster every 10 years. Zoster vaccine. You may need this after age 60. Pneumococcal 13-valent conjugate (  PCV13) vaccine. One dose is recommended after age  52. Pneumococcal polysaccharide (PPSV23) vaccine. One dose is recommended after age 22. Talk to your health care provider about which screenings and vaccines you need and how often you need them. This information is not intended to replace advice given to you by your health care provider. Make sure you discuss any questions you have with your health care provider. Document Released: 07/18/2015 Document Revised: 03/10/2016 Document Reviewed: 04/22/2015 Elsevier Interactive Patient Education  2017 [employer] Prevention in the Home Falls can cause injuries. They can happen to people of all ages. There are many things you can do to make your home safe and to help prevent falls. What can I do on the outside of my home? Regularly fix the edges of walkways and driveways and fix any cracks. Remove anything that might make you trip as you walk through a door, such as a raised step or threshold. Trim any bushes or trees on the path to your home. Use bright outdoor lighting. Clear any walking paths of anything that might make someone trip, such as rocks or tools. Regularly check to see if handrails are loose or broken. Make sure that both sides of any steps have handrails. Any raised decks and porches should have guardrails on the edges. Have any leaves, snow, or ice cleared regularly. Use sand or salt on walking paths during winter. Clean up any spills in your garage right away. This includes oil or grease spills. What can I do in the bathroom? Use night lights. Install grab bars by the toilet and in the tub and shower. Do not use towel bars as grab bars. Use non-skid mats or decals in the tub or shower. If you need to sit down in the shower, use a plastic, non-slip stool. Keep the floor dry. Clean up any water that spills on the floor as soon as it happens. Remove soap buildup in the tub or shower regularly. Attach bath mats securely with double-sided non-slip rug tape. Do not have throw  rugs and other things on the floor that can make you trip. What can I do in the bedroom? Use night lights. Make sure that you have a light by your bed that is easy to reach. Do not use any sheets or blankets that are too big for your bed. They should not hang down onto the floor. Have a firm chair that has side arms. You can use this for support while you get dressed. Do not have throw rugs and other things on the floor that can make you trip. What can I do in the kitchen? Clean up any spills right away. Avoid walking on wet floors. Keep items that you use a lot in easy-to-reach places. If you need to reach something above you, use a strong step stool that has a grab bar. Keep electrical cords out of the way. Do not use floor polish or wax that makes floors slippery. If you must use wax, use non-skid floor wax. Do not have throw rugs and other things on the floor that can make you trip. What can I do with my stairs? Do not leave any items on the stairs. Make sure that there are handrails on both sides of the stairs and use them. Fix handrails that are broken or loose. Make sure that handrails are as long as the stairways. Check any carpeting to make sure that it is firmly attached to the stairs. Fix any carpet that is  loose or worn. Avoid having throw rugs at the top or bottom of the stairs. If you do have throw rugs, attach them to the floor with carpet tape. Make sure that you have a light switch at the top of the stairs and the bottom of the stairs. If you do not have them, ask someone to add them for you. What else can I do to help prevent falls? Wear shoes that: Do not have high heels. Have rubber bottoms. Are comfortable and fit you well. Are closed at the toe. Do not wear sandals. If you use a stepladder: Make sure that it is fully opened. Do not climb a closed stepladder. Make sure that both sides of the stepladder are locked into place. Ask someone to hold it for you, if  possible. Clearly mark and make sure that you can see: Any grab bars or handrails. First and last steps. Where the edge of each step is. Use tools that help you move around (mobility aids) if they are needed. These include: Canes. Walkers. Scooters. Crutches. Turn on the lights when you go into a dark area. Replace any light bulbs as soon as they burn out. Set up your furniture so you have a clear path. Avoid moving your furniture around. If any of your floors are uneven, fix them. If there are any pets around you, be aware of where they are. Review your medicines with your doctor. Some medicines can make you feel dizzy. This can increase your chance of falling. Ask your doctor what other things that you can do to help prevent falls. This information is not intended to replace advice given to you by your health care provider. Make sure you discuss any questions you have with your health care provider. Document Released: 04/17/2009 Document Revised: 11/27/2015 Document Reviewed: 07/26/2014 Elsevier Interactive Patient Education  2017 [employer].

## 2021-04-09 NOTE — Progress Notes (Signed)
This service is provided via telemedicine  No vital signs collected/recorded due to the encounter was a telemedicine visit.   Location of patient (ex: home, work):  Home  Patient consents to a telephone visit:  Yes, see encounter dated 04/09/2021  Location of the provider (ex: office, home):  [place]  Name of any referring provider:  N/A  Names of all persons participating in the telemedicine service and their role in the encounter:  Sherrie Mustache, Nurse Practitioner, Carroll Kinds, CMA, and patient.   Time spent on call:  10 minutes with medical assistant

## 2021-04-13 ENCOUNTER — Encounter: Payer: Self-pay | Admitting: Physical Therapy

## 2021-04-13 ENCOUNTER — Other Ambulatory Visit: Payer: Self-pay

## 2021-04-13 ENCOUNTER — Ambulatory Visit: Payer: Medicare Other | Admitting: Physical Therapy

## 2021-04-13 DIAGNOSIS — R2689 Other abnormalities of gait and mobility: Secondary | ICD-10-CM

## 2021-04-13 DIAGNOSIS — M79604 Pain in right leg: Secondary | ICD-10-CM

## 2021-04-13 DIAGNOSIS — G8929 Other chronic pain: Secondary | ICD-10-CM

## 2021-04-13 DIAGNOSIS — M545 Low back pain, unspecified: Secondary | ICD-10-CM

## 2021-04-13 DIAGNOSIS — M79605 Pain in left leg: Secondary | ICD-10-CM | POA: Diagnosis not present

## 2021-04-13 DIAGNOSIS — M6281 Muscle weakness (generalized): Secondary | ICD-10-CM | POA: Diagnosis not present

## 2021-04-13 NOTE — Therapy (Signed)
[place], [place], [address] Phone: [phone]   Fax:  [phone]  Physical Therapy Treatment  Patient Details  Name: Kristi [name] MRN: [id number] Date of Birth: [birth year] Referring Provider (PT): Lauree Chandler, NP   Encounter Date: 04/13/2021   PT End of Session - 04/13/21 0722     Visit Number 2    Number of Visits 17    Date for PT Re-Evaluation 06/01/21    Authorization Type UHC MCR    PT Start Time 0718    PT Stop Time 0800    PT Time Calculation (min) 42 min             Past Medical History:  Diagnosis Date   Angina at rest [facility])    Arthritis    Asthma    Depression    GERD (gastroesophageal reflux disease)    Hypertension    Migraines    Obesity     Past Surgical History:  Procedure Laterality Date   [place]    There were no vitals filed for this visit.   Subjective Assessment - 04/13/21 0720     Subjective I am hurting this morning. I have been getting the exercises in. I can do the exercises in between calls while working..    Patient Stated Goals Pt would like to decrease pain in multiple areas in order to get back to walking recreationally    Currently in Pain? Yes    Pain Score 7     Pain Location Back    Pain Orientation Lower    Pain Type Chronic pain    Aggravating Factors  standing , walking , transitions    Pain Relieving Factors rest    Pain Score 7    Pain Location Knee    Pain Orientation Right;Left    Pain Descriptors / Indicators Aching;Tightness;Sharp    Pain Type Chronic pain    Aggravating Factors  standing and walking, laying on left side    Pain Relieving Factors resting on a pillow at night                               [facility] Adult PT Treatment/Exercise - 04/13/21 0001       Knee/Hip Exercises: Stretches   Active Hamstring  Stretch 2 reps;20 seconds    Active Hamstring Stretch Limitations seated EOM      Knee/Hip Exercises: Seated   Long Arc Quad 10 reps    Long Arc Quad Limitations alternating , 5 sec hold    Heel Slides 10 reps    Heel Slides Limitations using wash cloth on floor    Ball Squeeze 10 reps x 5 sec    Clamshell with TheraBand Green   10 reps x 5 sec   Other Seated Knee/Hip Exercises seated pelvic tilts with mod cues for technique x 10    Marching 10 reps      Knee/Hip Exercises: Supine   Other Supine Knee/Hip Exercises pelvic tilit x 5 , AROM clams, AROM march, AROM heel slides x 5 each to review seated verses Supine mobility                     PT Education - 04/13/21 0800     Education Details HEP  Person(s) Educated Patient    Methods Explanation;Handout    Comprehension Verbalized understanding              PT Short Term Goals - 04/06/21 2136       PT SHORT TERM GOAL #1   Title Pt will be compliant with initial HEP for improved carryover between session    Baseline initial HEP given    Time 3    Period Weeks    Status New    Target Date 04/27/21               PT Long Term Goals - 04/06/21 2137       PT LONG TERM GOAL #1   Title Pt will improve FOTO score to no less than 54% as proxy for functional improvement    Baseline 40% function    Time 8    Period Weeks    Status New    Target Date 06/01/21      PT LONG TERM GOAL #2   Title Pt will increase 30 Sec STS to no less than 6 reps for improved comfort and functional mobility    Baseline 3 reps    Time 8    Period Weeks    Status New    Target Date 06/01/21      PT LONG TERM GOAL #3   Title Pt will decrease TUG to no greater than 12 seconds for improved balance and mobility    Baseline 17 seconds    Time 8    Period Weeks    Status New    Target Date 06/01/21      PT LONG TERM GOAL #4   Title Pt will self report generalized pain no greater than 3/10 at worst for improved comfort  and function    Baseline 10/10 at worst in multiple joints    Time 8    Period Weeks    Status New    Target Date 06/01/21                   Plan - 04/13/21 0902     Clinical Impression Statement Pt reports 7/10 pain on arrival. She reports compliance with HEP. Reviewed HEP and progressed with additional seated and supine therex for lumbar and hip/knee mobility. She tolerated the exercises well wtih fatigue. She reported some increased discomfort in back and knees with repetitions.  At end of session she reported soreness. Her HEP was updated.    PT Treatment/Interventions ADLs/Self Care Home Management;Aquatic Therapy;Cryotherapy;Moist Heat;Gait training;Stair training;Functional mobility training;Therapeutic activities;Therapeutic exercise;Balance training;Neuromuscular re-education;Patient/family education;Manual techniques;Passive range of motion;Dry needling;Taping;Vasopneumatic Device    PT Next Visit Plan assess response to HEP, balance assessment with SLS, progress LE strength    PT Home Exercise Plan Access Code: [email or web address]    Consulted and Agree with Plan of Care Patient             Patient will benefit from skilled therapeutic intervention in order to improve the following deficits and impairments:  Abnormal gait, Cardiopulmonary status limiting activity, Decreased activity tolerance, Decreased balance, Decreased endurance, Decreased mobility, Decreased range of motion, Decreased strength, Difficulty walking, Dizziness, Postural dysfunction, Improper body mechanics, Obesity, Pain  Visit Diagnosis: Muscle weakness (generalized)  Chronic low back pain, unspecified back pain laterality, unspecified whether sciatica present  Other abnormalities of gait and mobility  Pain in left leg  Pain in right leg     Problem List Patient Active Problem List   Diagnosis Date  Noted   Chest pain 02/24/2017   Atypical chest pain 05/10/2016   HTN (hypertension) 05/10/2016    Hypertensive emergency 05/10/2016   Anxiety 05/10/2016   Gastroesophageal reflux disease with esophagitis     Dorene Ar, PTA 04/13/2021, 9:50 AM  [place], [place], [address] Phone: [phone]   Fax:  [phone]  Name: Kristi [name] MRN: [id number] Date of Birth: Jul 20, 1955

## 2021-04-13 NOTE — Patient Instructions (Signed)
Access Code: [facility] URL: [email or web address] Date: 04/13/2021 Prepared by: Hessie Diener  Exercises Seated Hip Adduction Isometrics with Diona Foley - 1 x daily - 7 x weekly - 3 sets - 10 reps - 5 sec hold Seated Hip Abduction with Resistance - 1 x daily - 7 x weekly - 3 sets - 10 reps Supine Lower Trunk Rotation - 1 x daily - 7 x weekly - 3 sets - 10 reps Seated Knee Flexion Extension AROM - 1 x daily - 7 x weekly - 1 sets - 10 reps - 5 hold Seated Long Arc Quad - 1 x daily - 7 x weekly - 2 sets - 10 reps - 5 hold Seated Hamstring Stretch - 2 x daily - 7 x weekly - 1 sets - 2-3 reps - 30 hold

## 2021-04-17 ENCOUNTER — Ambulatory Visit: Payer: Medicare Other | Admitting: Physical Therapy

## 2021-04-20 ENCOUNTER — Other Ambulatory Visit: Payer: Self-pay

## 2021-04-20 ENCOUNTER — Ambulatory Visit: Payer: Medicare Other

## 2021-04-20 DIAGNOSIS — M79604 Pain in right leg: Secondary | ICD-10-CM

## 2021-04-20 DIAGNOSIS — R2689 Other abnormalities of gait and mobility: Secondary | ICD-10-CM | POA: Diagnosis not present

## 2021-04-20 DIAGNOSIS — M79605 Pain in left leg: Secondary | ICD-10-CM | POA: Diagnosis not present

## 2021-04-20 DIAGNOSIS — G8929 Other chronic pain: Secondary | ICD-10-CM

## 2021-04-20 DIAGNOSIS — M6281 Muscle weakness (generalized): Secondary | ICD-10-CM | POA: Diagnosis not present

## 2021-04-20 DIAGNOSIS — M545 Low back pain, unspecified: Secondary | ICD-10-CM

## 2021-04-20 NOTE — Therapy (Signed)
[place], [place], [address] Phone: [phone]   Fax:  [phone]  Physical Therapy Treatment  Patient Details  Name: Kristi [name] MRN: [id number] Date of Birth: 18-Apr-1956 Referring Provider (PT): Lauree Chandler, NP   Encounter Date: 04/20/2021   PT End of Session - 04/20/21 0746     Visit Number 3    Number of Visits 17    Date for PT Re-Evaluation 06/01/21    Authorization Type UHC MCR    PT Start Time 0746    PT Stop Time 0825    PT Time Calculation (min) 39 min             Past Medical History:  Diagnosis Date   Angina at rest [facility])    Arthritis    Asthma    Depression    GERD (gastroesophageal reflux disease)    Hypertension    Migraines    Obesity     Past Surgical History:  Procedure Laterality Date   [place]    There were no vitals filed for this visit.   Subjective Assessment - 04/20/21 0746     Subjective Pt presents to PT with continued reports of lower back and L knee pain. She has been compliant with her HEP with no adverse effect. Pt is ready to begin PT treatment at this time.    Currently in Pain? Yes    Pain Score 9     Pain Location Back    Pain Orientation Lower    Pain Score 10    Pain Location Knee    Pain Orientation Left           [facility] Adult PT Treatment/Exercise:   Therapeutic Exercise:  NuStep lvl 5 x 4 min UE/LE while taking subjective Seated ball squeeze 2x10 - 5 sec hold Seated clamshell 2x15 green tband Seated march 2x20 green tband LAQ 2x10 - 2lbs on R LE Supine PPT x 10 - 5 sec hold Supine march x 20 Supine bent knee fallout x 10 ea Seated hamstring stretch 2x30 sec                               PT Short Term Goals - 04/06/21 2136       PT SHORT TERM GOAL #1   Title Pt will be compliant with initial HEP for  improved carryover between session    Baseline initial HEP given    Time 3    Period Weeks    Status New    Target Date 04/27/21               PT Long Term Goals - 04/06/21 2137       PT LONG TERM GOAL #1   Title Pt will improve FOTO score to no less than 54% as proxy for functional improvement    Baseline 40% function    Time 8    Period Weeks    Status New    Target Date 06/01/21      PT LONG TERM GOAL #2   Title Pt will increase 30 Sec STS to no less than 6 reps for improved comfort and functional mobility    Baseline 3 reps    Time 8    Period Weeks    Status New  Target Date 06/01/21      PT LONG TERM GOAL #3   Title Pt will decrease TUG to no greater than 12 seconds for improved balance and mobility    Baseline 17 seconds    Time 8    Period Weeks    Status New    Target Date 06/01/21      PT LONG TERM GOAL #4   Title Pt will self report generalized pain no greater than 3/10 at worst for improved comfort and function    Baseline 10/10 at worst in multiple joints    Time 8    Period Weeks    Status New    Target Date 06/01/21                   Plan - 04/20/21 0810     Clinical Impression Statement Pt was once again able to complete prescribed exercises and tolerated treatment fair overall. Continues to demonstrate decreased functional activity tolerance and LE weakness. Today's session focused on continued LE strengthening and improving core motor control and endurance. Overall she is progressing as expected with PT and will continue to be seen and progressed as tolerated.    PT Treatment/Interventions ADLs/Self Care Home Management;Aquatic Therapy;Cryotherapy;Moist Heat;Gait training;Stair training;Functional mobility training;Therapeutic activities;Therapeutic exercise;Balance training;Neuromuscular re-education;Patient/family education;Manual techniques;Passive range of motion;Dry needling;Taping;Vasopneumatic Device    PT Next Visit Plan  assess response to HEP, balance assessment with SLS, progress LE strength    PT Home Exercise Plan Access Code: [facility]             Patient will benefit from skilled therapeutic intervention in order to improve the following deficits and impairments:  Abnormal gait, Cardiopulmonary status limiting activity, Decreased activity tolerance, Decreased balance, Decreased endurance, Decreased mobility, Decreased range of motion, Decreased strength, Difficulty walking, Dizziness, Postural dysfunction, Improper body mechanics, Obesity, Pain  Visit Diagnosis: Muscle weakness (generalized)  Chronic low back pain, unspecified back pain laterality, unspecified whether sciatica present  Other abnormalities of gait and mobility  Pain in left leg  Pain in right leg     Problem List Patient Active Problem List   Diagnosis Date Noted   Chest pain 02/24/2017   Atypical chest pain 05/10/2016   HTN (hypertension) 05/10/2016   Hypertensive emergency 05/10/2016   Anxiety 05/10/2016   Gastroesophageal reflux disease with esophagitis     Ward Chatters, PT 04/20/2021, 8:28 AM  [facility] [address] [place], [place], [address] Phone: [phone]   Fax:  [phone]  Name: Deidra Spease MRN: [id number] Date of Birth: 01-10-1956

## 2021-04-21 ENCOUNTER — Telehealth: Payer: Medicare Other | Admitting: Physician Assistant

## 2021-04-21 DIAGNOSIS — B9789 Other viral agents as the cause of diseases classified elsewhere: Secondary | ICD-10-CM

## 2021-04-21 DIAGNOSIS — J329 Chronic sinusitis, unspecified: Secondary | ICD-10-CM

## 2021-04-21 MED ORDER — FLUTICASONE PROPIONATE 50 MCG/ACT NA SUSP: 2.0000 | Freq: Every day | NASAL | 0 refills | Status: DC

## 2021-04-21 NOTE — Progress Notes (Signed)
E-Visit for Sinus Problems  We are sorry that you are not feeling well.  Here is how we plan to help!  Based on what you have shared with me it looks like you have sinusitis.  Sinusitis is inflammation and infection in the sinus cavities of the head.  Based on your presentation I believe you most likely have Acute Viral Sinusitis.This is an infection most likely caused by a virus. There is not specific treatment for viral sinusitis other than to help you with the symptoms until the infection runs its course.  You may use an oral decongestant such as Mucinex D or if you have glaucoma or high blood pressure use plain Mucinex. Saline nasal spray help and can safely be used as often as needed for congestion, I have prescribed: Fluticasone nasal spray two sprays in each nostril once a day.  Providers prescribe antibiotics to treat infections caused by bacteria. Antibiotics are very powerful in treating bacterial infections when they are used properly. To maintain their effectiveness, they should be used only when necessary. Overuse of antibiotics has resulted in the development of superbugs that are resistant to treatment!    After careful review of your answers, I would not recommend an antibiotic for your condition.  Antibiotics are not effective against viruses and therefore should not be used to treat them. Common examples of infections caused by viruses include colds and flu  Some authorities believe that zinc sprays or the use of Echinacea may shorten the course of your symptoms.  Sinus infections are not as easily transmitted as other respiratory infection, however we still recommend that you avoid close contact with loved ones, especially the very young and elderly.  Remember to wash your hands thoroughly throughout the day as this is the number one way to prevent the spread of infection!  Home Care: Only take medications as instructed by your medical team. Do not take these medications with  alcohol. A steam or ultrasonic humidifier can help congestion.  You can place a towel over your head and breathe in the steam from hot water coming from a faucet. Avoid close contacts especially the very young and the elderly. Cover your mouth when you cough or sneeze. Always remember to wash your hands.  Get Help Right Away If: You develop worsening fever or sinus pain. You develop a severe head ache or visual changes. Your symptoms persist after you have completed your treatment plan.  Make sure you Understand these instructions. Will watch your condition. Will get help right away if you are not doing well or get worse.   Thank you for choosing an e-visit.  Your e-visit answers were reviewed by a board certified advanced clinical practitioner to complete your personal care plan. Depending upon the condition, your plan could have included both over the counter or prescription medications.  Please review your pharmacy choice. Make sure the pharmacy is open so you can pick up prescription now. If there is a problem, you may contact your provider through [employer] and have the prescription routed to another pharmacy.  Your safety is important to [place]. If you have drug allergies check your prescription carefully.   For the next 24 hours you can use MyChart to ask questions about today's visit, request a non-urgent call back, or ask for a work or school excuse. You will get an email in the next two days asking about your experience. I hope that your e-visit has been valuable and will speed your recovery.

## 2021-04-21 NOTE — Progress Notes (Signed)
I have spent 5 minutes in review of e-visit questionnaire, review and updating patient chart, medical decision making and response to patient.   Aalyiah Camberos Cody Nhyira Leano, PA-C    

## 2021-04-23 ENCOUNTER — Ambulatory Visit: Payer: Medicare Other

## 2021-04-23 ENCOUNTER — Telehealth: Payer: Medicare Other | Admitting: Physician Assistant

## 2021-04-23 ENCOUNTER — Telehealth: Payer: Self-pay

## 2021-04-23 DIAGNOSIS — J40 Bronchitis, not specified as acute or chronic: Secondary | ICD-10-CM

## 2021-04-23 DIAGNOSIS — J069 Acute upper respiratory infection, unspecified: Secondary | ICD-10-CM

## 2021-04-23 MED ORDER — BENZONATATE 100 MG PO CAPS: 100.0000 mg | ORAL_CAPSULE | Freq: Three times a day (TID) | ORAL | 0 refills | Status: DC | PRN

## 2021-04-23 MED ORDER — PREDNISONE 10 MG (21) PO TBPK: ORAL_TABLET | ORAL | 0 refills | Status: DC

## 2021-04-23 NOTE — Telephone Encounter (Signed)
PT called and left voicemail regarding missed appointment. Reminded her of next scheduled visit and of attendance policy.  Ward Chatters, PT, DPT 04/23/21 9:45 AM

## 2021-04-23 NOTE — Progress Notes (Signed)
We are sorry that you are not feeling well.  Here is how we plan to help!  Based on your presentation I believe you most likely have A cough due to a virus.  This is called viral bronchitis and is best treated by rest, plenty of fluids and control of the cough.  You may use Ibuprofen or Tylenol as directed to help your symptoms.     In addition you may use A prescription cough medication called Tessalon Perles 100mg . You may take 1-2 capsules every 8 hours as needed for your cough.  Prednisone 10 mg daily for 6 days (see taper instructions below)  Directions for 6 day taper: Day 1: 2 tablets before breakfast, 1 after both lunch & dinner and 2 at bedtime Day 2: 1 tab before breakfast, 1 after both lunch & dinner and 2 at bedtime Day 3: 1 tab at each meal & 1 at bedtime Day 4: 1 tab at breakfast, 1 at lunch, 1 at bedtime Day 5: 1 tab at breakfast & 1 tab at bedtime Day 6: 1 tab at breakfast  From your responses in the eVisit questionnaire you describe inflammation in the upper respiratory tract which is causing a significant cough.  This is commonly called Bronchitis and has four common causes:   Allergies Viral Infections Acid Reflux Bacterial Infection Allergies, viruses and acid reflux are treated by controlling symptoms or eliminating the cause. An example might be a cough caused by taking certain blood pressure medications. You stop the cough by changing the medication. Another example might be a cough caused by acid reflux. Controlling the reflux helps control the cough.  USE OF BRONCHODILATOR ("RESCUE") INHALERS: There is a risk from using your bronchodilator too frequently.  The risk is that over-reliance on a medication which only relaxes the muscles surrounding the breathing tubes can reduce the effectiveness of medications prescribed to reduce swelling and congestion of the tubes themselves.  Although you feel brief relief from the bronchodilator inhaler, your asthma may actually be  worsening with the tubes becoming more swollen and filled with mucus.  This can delay other crucial treatments, such as oral steroid medications. If you need to use a bronchodilator inhaler daily, several times per day, you should discuss this with your provider.  There are probably better treatments that could be used to keep your asthma under control.    E-Visit for Corona Virus Screening  Your current symptoms could be consistent with the coronavirus.  Many health care providers can now test patients at their office but not all are.  [place] has multiple testing sites. For information on our [place] testing locations and hours go to [email or web address]  We are enrolling you in our [place] for [place] . Daily you will receive a questionnaire within the [place] website. Our COVID 19 response team will be monitoring your responses daily.  Testing Information: The COVID-19 Community Testing sites are testing BY APPOINTMENT ONLY.  You can schedule online at [email or web address]  If you do not have access to a smart phone or computer you may call [phone] for an appointment.   Additional testing sites in the Community:  For [facility] Testing sites in [place]  FaceUpdate.[email or web address]  For Pop-up testing sites in [place]  [email or web address]  For [facility] [email or web address]  For [facility] testing in [place] and [employer] [email or web address]  For [facility] testing in [place]   [email or web address]  For  more information about community testing call [phone]  Please quarantine yourself while awaiting your test  results. Please stay home for a minimum of 10 days from the first day of illness with improving symptoms and you have had 24 hours of no fever (without the use of Tylenol (Acetaminophen) Motrin (Ibuprofen) or any fever reducing medication).  Also - Do not get tested prior to returning to work because once you have had a positive test the test can stay positive for more than a month in some cases.   You should wear a mask or cloth face covering over your nose and mouth if you must be around other people or animals, including pets (even at home). Try to stay at least 6 feet away from other people. This will protect the people around you.  Please continue good preventive care measures, including:  frequent hand-washing, avoid touching your face, cover coughs/sneezes, stay out of crowds and keep a 6 foot distance from others.  COVID-19 is a respiratory illness with symptoms that are similar to the flu. Symptoms are typically mild to moderate, but there have been cases of severe illness and death due to the virus.   The following symptoms may appear 2-14 days after exposure: Fever Cough Shortness of breath or difficulty breathing Chills Repeated shaking with chills Muscle pain Headache Sore throat New loss of taste or smell Fatigue Congestion or runny nose Nausea or vomiting Diarrhea  Go to the nearest hospital ED for assessment if fever/cough/breathlessness are severe or illness seems like a threat to life.  It is vitally important that if you feel that you have an infection such as this virus or any other virus that you stay home and away from places where you may spread it to others.  You should avoid contact with people age 66 and older.   HOME CARE Only take medications as instructed by your medical team. Complete the entire course of an antibiotic. Drink plenty of fluids and get plenty of rest. Avoid close contacts especially the very young and the elderly Cover your mouth if you cough or  cough into your sleeve. Always remember to wash your hands A steam or ultrasonic humidifier can help congestion.    FOR COVID-19. If you or someone is showing any of these signs seek emergency medical care immediately. Call 911 or proceed to your closest emergency facility if: You develop worsening high fever. Trouble breathing Bluish lips or face Persistent pain or pressure in the chest New confusion Inability to wake or stay awake You cough up blood. Your symptoms become more severe  **This list is not all possible symptoms. Contact your medical provider for any symptoms that are sever or concerning to you.  MAKE SURE YOU  Understand these instructions. Will watch your condition. Will get help right away if you are not doing well or get worse.    Thank you for choosing an e-visit.  Your e-visit answers were reviewed by a board certified advanced clinical practitioner to complete your personal care plan. Depending upon the condition, your plan could have included both over the counter or prescription medications.  Please review your pharmacy choice. Make sure the pharmacy is open so you can pick up prescription now. If there is a problem, you may contact your provider through [employer] and have the prescription routed to another pharmacy.  Your safety is important to [place]. If you have drug allergies check your prescription carefully.   For the next 24 hours you can use MyChart to ask questions about today's  visit, request a non-urgent call back, or ask for a work or school excuse. You will get an email in the next two days asking about your experience. I hope that your e-visit has been valuable and will speed your recovery.  I provided 7 minutes of non face-to-face time during this encounter for chart review and documentation.

## 2021-04-27 ENCOUNTER — Other Ambulatory Visit: Payer: Self-pay

## 2021-04-27 ENCOUNTER — Ambulatory Visit: Payer: Medicare Other

## 2021-04-27 DIAGNOSIS — M79604 Pain in right leg: Secondary | ICD-10-CM | POA: Diagnosis not present

## 2021-04-27 DIAGNOSIS — R2689 Other abnormalities of gait and mobility: Secondary | ICD-10-CM

## 2021-04-27 DIAGNOSIS — M545 Low back pain, unspecified: Secondary | ICD-10-CM | POA: Diagnosis not present

## 2021-04-27 DIAGNOSIS — M79605 Pain in left leg: Secondary | ICD-10-CM | POA: Diagnosis not present

## 2021-04-27 DIAGNOSIS — M6281 Muscle weakness (generalized): Secondary | ICD-10-CM

## 2021-04-27 DIAGNOSIS — G8929 Other chronic pain: Secondary | ICD-10-CM | POA: Diagnosis not present

## 2021-04-27 NOTE — Therapy (Signed)
[place], [place], [address] Phone: [phone]   Fax:  [phone]  Physical Therapy Treatment  Patient Details  Name: Kristi [name] MRN: [id number] Date of Birth: 1956/03/15 Referring Provider (PT): Lauree Chandler, NP   Encounter Date: 04/27/2021   PT End of Session - 04/27/21 0829     Visit Number 4    Number of Visits 17    Date for PT Re-Evaluation 06/01/21    Authorization Type UHC MCR    PT Start Time 0830    PT Stop Time 0912    PT Time Calculation (min) 42 min             Past Medical History:  Diagnosis Date   Angina at rest [facility])    Arthritis    Asthma    Depression    GERD (gastroesophageal reflux disease)    Hypertension    Migraines    Obesity     Past Surgical History:  Procedure Laterality Date   [place]    There were no vitals filed for this visit.   Subjective Assessment - 04/27/21 0833     Subjective Pt presents to PT with reports of lower back pain and discomfort. She continues to be compliant with her HEP with no adverse effect, but did take two days off as she was getting over viral bronchitis. She is ready to begin PT at this time.    Currently in Pain? Yes    Pain Score 4     Pain Location Back    Pain Orientation Lower    Pain Score 6    Pain Location Knee    Pain Orientation Left           [facility] Adult PT Treatment/Exercise:   Therapeutic Exercise:  NuStep lvl 5 x 4 min UE/LE while taking subjective Seated ball squeeze 2x10 - 5 sec hold Seated clamshell 2x15 green tband Seated march 2x20 4lbs LAQ 2x10 - 4lbs ea Supine PPT x 10 - 5 sec hold Supine PPT w/ ball squeeze 2x10 - 5 sec Supine PPT w/ march x 20 green tband Supine bent knee fallout x 10 ea green tband Seated hamstring stretch 2x30 sec (not  today)                               PT Short Term Goals - 04/06/21 2136       PT SHORT TERM GOAL #1   Title Pt will be compliant with initial HEP for improved carryover between session    Baseline initial HEP given    Time 3    Period Weeks    Status New    Target Date 04/27/21               PT Long Term Goals - 04/06/21 2137       PT LONG TERM GOAL #1   Title Pt will improve FOTO score to no less than 54% as proxy for functional improvement    Baseline 40% function    Time 8    Period Weeks    Status New    Target Date 06/01/21      PT LONG TERM GOAL #2   Title Pt will increase 30 Sec STS to no less than 6 reps for improved comfort  and functional mobility    Baseline 3 reps    Time 8    Period Weeks    Status New    Target Date 06/01/21      PT LONG TERM GOAL #3   Title Pt will decrease TUG to no greater than 12 seconds for improved balance and mobility    Baseline 17 seconds    Time 8    Period Weeks    Status New    Target Date 06/01/21      PT LONG TERM GOAL #4   Title Pt will self report generalized pain no greater than 3/10 at worst for improved comfort and function    Baseline 10/10 at worst in multiple joints    Time 8    Period Weeks    Status New    Target Date 06/01/21                   Plan - 04/27/21 0839     Clinical Impression Statement Pt was once again able to complete prescribed therapeutic exercises with no adverse effect. Today's session we continued to focus on general core and LE strengthening exercises in order to decrease pain and improve functional ability. Pt is progressing as expected thus far and continues to benefit from skilled services. Will continue to progress as tolerated per POC.    PT Next Visit Plan assess response to HEP, balance assessment with SLS, progress LE strength    PT Home Exercise Plan Access Code: [facility]             Patient will benefit from skilled therapeutic  intervention in order to improve the following deficits and impairments:  Abnormal gait, Cardiopulmonary status limiting activity, Decreased activity tolerance, Decreased balance, Decreased endurance, Decreased mobility, Decreased range of motion, Decreased strength, Difficulty walking, Dizziness, Postural dysfunction, Improper body mechanics, Obesity, Pain  Visit Diagnosis: Muscle weakness (generalized)  Chronic low back pain, unspecified back pain laterality, unspecified whether sciatica present  Other abnormalities of gait and mobility  Pain in left leg  Pain in right leg     Problem List Patient Active Problem List   Diagnosis Date Noted   Chest pain 02/24/2017   Atypical chest pain 05/10/2016   HTN (hypertension) 05/10/2016   Hypertensive emergency 05/10/2016   Anxiety 05/10/2016   Gastroesophageal reflux disease with esophagitis     Ward Chatters, PT 04/27/2021, 9:12 AM  [facility] [address] [place], [place], [address] Phone: [phone]   Fax:  [phone]  Name: Kristi [name] MRN: [id number] Date of Birth: Dec 31, 1955

## 2021-04-29 ENCOUNTER — Ambulatory Visit: Payer: Medicare Other

## 2021-04-29 ENCOUNTER — Other Ambulatory Visit: Payer: Self-pay

## 2021-04-29 DIAGNOSIS — G8929 Other chronic pain: Secondary | ICD-10-CM | POA: Diagnosis not present

## 2021-04-29 DIAGNOSIS — R2689 Other abnormalities of gait and mobility: Secondary | ICD-10-CM

## 2021-04-29 DIAGNOSIS — M6281 Muscle weakness (generalized): Secondary | ICD-10-CM

## 2021-04-29 DIAGNOSIS — M545 Low back pain, unspecified: Secondary | ICD-10-CM | POA: Diagnosis not present

## 2021-04-29 DIAGNOSIS — M79604 Pain in right leg: Secondary | ICD-10-CM

## 2021-04-29 DIAGNOSIS — M79605 Pain in left leg: Secondary | ICD-10-CM | POA: Diagnosis not present

## 2021-04-29 NOTE — Therapy (Signed)
[place], [place], [address] Phone: [phone]   Fax:  [phone]  Physical Therapy Treatment  Patient Details  Name: Kristi [name] MRN: [id number] Date of Birth: 08-15-1955 Referring Provider (PT): Lauree Chandler, NP   Encounter Date: 04/29/2021   PT End of Session - 04/29/21 0920     Visit Number 5    Number of Visits 17    Date for PT Re-Evaluation 06/01/21    Authorization Type UHC MCR    PT Start Time 0920    PT Stop Time 0958    PT Time Calculation (min) 38 min             Past Medical History:  Diagnosis Date   Angina at rest [facility])    Arthritis    Asthma    Depression    GERD (gastroesophageal reflux disease)    Hypertension    Migraines    Obesity     Past Surgical History:  Procedure Laterality Date   [place]    There were no vitals filed for this visit.   Subjective Assessment - 04/29/21 0921     Subjective Pt presents to PT with reports of back and bilateral LE pain. She is ready to begin PT at this time.    Currently in Pain? Yes   6/10 bilat LEs   Pain Score 8     Pain Location Back    Pain Orientation Upper;Mid              [facility] Adult PT Treatment/Exercise:   Therapeutic Exercise:  STS 2x10 - no UE support Seated march 2x20 4lbs LAQ x 15 - 4lbs ea Supine PPT x 10 - 5 sec hold Supine PPT w/ ball squeeze 2x10 - 5 sec Supine PPT w/ march x 20 blue tband Supine bent knee fallout x 10 ea blue tband Seated hamstring curl blue tband 2x10 ea  Past Interventions Not Performed Today: Seated ball squeeze 2x10 - 5 sec hold Seated clamshell 2x15 green tband Seated hamstring stretch 2x30 sec NuStep lvl 5 x 4 min UE/LE while taking subjective                             PT Short Term Goals - 04/06/21 2136       PT SHORT TERM GOAL #1   Title  Pt will be compliant with initial HEP for improved carryover between session    Baseline initial HEP given    Time 3    Period Weeks    Status New    Target Date 04/27/21               PT Long Term Goals - 04/06/21 2137       PT LONG TERM GOAL #1   Title Pt will improve FOTO score to no less than 54% as proxy for functional improvement    Baseline 40% function    Time 8    Period Weeks    Status New    Target Date 06/01/21      PT LONG TERM GOAL #2   Title Pt will increase 30 Sec STS to no less than 6 reps for improved comfort and functional mobility    Baseline 3 reps    Time 8    Period Weeks  Status New    Target Date 06/01/21      PT LONG TERM GOAL #3   Title Pt will decrease TUG to no greater than 12 seconds for improved balance and mobility    Baseline 17 seconds    Time 8    Period Weeks    Status New    Target Date 06/01/21      PT LONG TERM GOAL #4   Title Pt will self report generalized pain no greater than 3/10 at worst for improved comfort and function    Baseline 10/10 at worst in multiple joints    Time 8    Period Weeks    Status New    Target Date 06/01/21                   Plan - 04/29/21 2952     Clinical Impression Statement Pt was able to complete all prescribed exercises with no adverse effect. She continues to progress as expected with therapy, with today's session focusing on improving core and proximal hip muscle strength to reduce pain. She did have difficulty with bridge exercises with bilateral hamstring cramps noted. She continues to benefit from skilled PT services working on improving strength and activity tolerance. Wll continue to progress as able.    PT Treatment/Interventions ADLs/Self Care Home Management;Aquatic Therapy;Cryotherapy;Moist Heat;Gait training;Stair training;Functional mobility training;Therapeutic activities;Therapeutic exercise;Balance training;Neuromuscular re-education;Patient/family education;Manual  techniques;Passive range of motion;Dry needling;Taping;Vasopneumatic Device    PT Next Visit Plan assess response to HEP, balance assessment with SLS, progress LE strength    PT Home Exercise Plan Access Code: [facility]             Patient will benefit from skilled therapeutic intervention in order to improve the following deficits and impairments:  Abnormal gait, Cardiopulmonary status limiting activity, Decreased activity tolerance, Decreased balance, Decreased endurance, Decreased mobility, Decreased range of motion, Decreased strength, Difficulty walking, Dizziness, Postural dysfunction, Improper body mechanics, Obesity, Pain  Visit Diagnosis: Muscle weakness (generalized)  Chronic low back pain, unspecified back pain laterality, unspecified whether sciatica present  Other abnormalities of gait and mobility  Pain in left leg  Pain in right leg     Problem List Patient Active Problem List   Diagnosis Date Noted   Chest pain 02/24/2017   Atypical chest pain 05/10/2016   HTN (hypertension) 05/10/2016   Hypertensive emergency 05/10/2016   Anxiety 05/10/2016   Gastroesophageal reflux disease with esophagitis     Ward Chatters, PT 04/29/2021, 10:00 AM  [facility] [address] [place], [place], [address] Phone: [phone]   Fax:  [phone]  Name: Kristi [name] MRN: [id number] Date of Birth: 1955/12/13

## 2021-04-30 ENCOUNTER — Other Ambulatory Visit: Payer: Self-pay | Admitting: *Deleted

## 2021-04-30 DIAGNOSIS — G43109 Migraine with aura, not intractable, without status migrainosus: Secondary | ICD-10-CM

## 2021-04-30 DIAGNOSIS — F419 Anxiety disorder, unspecified: Secondary | ICD-10-CM

## 2021-04-30 DIAGNOSIS — F339 Major depressive disorder, recurrent, unspecified: Secondary | ICD-10-CM

## 2021-04-30 MED ORDER — AMLODIPINE BESYLATE 10 MG PO TABS: 10.0000 mg | ORAL_TABLET | Freq: Every day | ORAL | 5 refills | Status: DC

## 2021-04-30 MED ORDER — ATORVASTATIN CALCIUM 10 MG PO TABS: 10.0000 mg | ORAL_TABLET | Freq: Every day | ORAL | 5 refills | Status: DC

## 2021-04-30 MED ORDER — VENLAFAXINE HCL ER 37.5 MG PO CP24
37.5000 mg | ORAL_CAPSULE | Freq: Every day | ORAL | 5 refills | Status: DC
Start: 2021-04-30 — End: 2021-10-05

## 2021-04-30 MED ORDER — PANTOPRAZOLE SODIUM 40 MG PO TBEC: 40.0000 mg | DELAYED_RELEASE_TABLET | Freq: Every morning | ORAL | 5 refills | Status: DC

## 2021-04-30 NOTE — Telephone Encounter (Signed)
Patient no longer wants to use local pharmacy. Wants Rx's to be sent to [facility] because it is cheaper, still only wants #30.   Rx's sent to [facility] per patient's request.

## 2021-05-04 ENCOUNTER — Ambulatory Visit: Payer: Medicare Other

## 2021-05-04 ENCOUNTER — Other Ambulatory Visit: Payer: Self-pay

## 2021-05-04 DIAGNOSIS — M79605 Pain in left leg: Secondary | ICD-10-CM

## 2021-05-04 DIAGNOSIS — R2689 Other abnormalities of gait and mobility: Secondary | ICD-10-CM | POA: Diagnosis not present

## 2021-05-04 DIAGNOSIS — M79604 Pain in right leg: Secondary | ICD-10-CM | POA: Diagnosis not present

## 2021-05-04 DIAGNOSIS — M6281 Muscle weakness (generalized): Secondary | ICD-10-CM | POA: Diagnosis not present

## 2021-05-04 DIAGNOSIS — G8929 Other chronic pain: Secondary | ICD-10-CM | POA: Diagnosis not present

## 2021-05-04 DIAGNOSIS — M545 Low back pain, unspecified: Secondary | ICD-10-CM | POA: Diagnosis not present

## 2021-05-04 NOTE — Therapy (Signed)
[place], [place], [address] Phone: [phone]   Fax:  [phone]  Physical Therapy Treatment  Patient Details  Name: Kristi [name] MRN: [id number] Date of Birth: May 04, 1956 Referring Provider (PT): Lauree Chandler, NP   Encounter Date: 05/04/2021   PT End of Session - 05/04/21 0831     Visit Number 6    Number of Visits 17    Date for PT Re-Evaluation 06/01/21    Authorization Type [employer] MCR    PT Start Time 0831    PT Stop Time 0912    PT Time Calculation (min) 41 min             Past Medical History:  Diagnosis Date   Angina at rest [facility])    Arthritis    Asthma    Depression    GERD (gastroesophageal reflux disease)    Hypertension    Migraines    Obesity     Past Surgical History:  Procedure Laterality Date   H[place]    There were no vitals filed for this visit.   Subjective Assessment - 05/04/21 0831     Subjective Pt presents to PT with reports of increased lower back pain. She notes that her bilat knees are feeling a little it better. Pt is ready to begin PT at this time.    Currently in Pain? Yes    Pain Score 9     Pain Location Back    Pain Orientation Lower           [facility] Adult PT Treatment/Exercise:   Therapeutic Exercise:  NuStep lvl 5 x 4 min UE/LE while taking subjective STS 2x10 - no UE support LAQ 2x10 - 5lbs ea Supine PPT x 10 - 5 sec hold Supine PPT w/ ball squeeze 2x10 - 5 sec Supine PPT w/ clamshell2x10 Supine PPT w/ march x 20 blue tband Supine bent knee fallout x 10 ea blue tband Seated hamstring curl blue tband 2x10 ea   Past Interventions Not Performed Today: Seated ball squeeze 2x10 - 5 sec hold Seated clamshell 2x15 green tband Seated hamstring stretch 2x30 sec Seated march 2x20 4lbs  Modalities: MHP to lumbar spine while peforming supine  exercises    [facility] PT Assessment - 05/04/21 0001       Observation/Other Assessments   Focus on Therapeutic Outcomes (FOTO)  39% function                                      PT Short Term Goals - 05/04/21 0858       PT SHORT TERM GOAL #1   Title Pt will be compliant with initial HEP for improved carryover between session    Baseline initial HEP given    Time 3    Period Weeks    Status Achieved    Target Date 04/27/21               PT Long Term Goals - 05/04/21 0907       PT LONG TERM GOAL #1   Title Pt will improve FOTO score to no less than 54% as proxy for functional improvement    Baseline 40% function; 39% on 10/31    Time 8    Period Weeks  Status On-going    Target Date 06/01/21      PT LONG TERM GOAL #2   Title Pt will increase 30 Sec STS to no less than 6 reps for improved comfort and functional mobility    Baseline 3 reps    Time 8    Period Weeks    Status On-going    Target Date 06/01/21      PT LONG TERM GOAL #3   Title Pt will decrease TUG to no greater than 12 seconds for improved balance and mobility    Baseline 17 seconds    Time 8    Period Weeks    Status On-going    Target Date 06/01/21      PT LONG TERM GOAL #4   Title Pt will self report generalized pain no greater than 3/10 at worst for improved comfort and function    Baseline 10/10 at worst in multiple joints    Time 8    Period Weeks    Status On-going    Target Date 06/01/21                   Plan - 05/04/21 8101     Clinical Impression Statement Pt was able to complete prescribed exercises with no adverse effect or change in baseline. Continues to progress well with therapy, noting continued improvement in bilateral knees and functional mobility. Today's session we continued to work on progressing core endurance and proximal hip strength in order to reduce pain. She continues to benefit from skilled therapy, with focus on progression  of strengthening and balance. Will continue to progress as tolerated.    PT Treatment/Interventions ADLs/Self Care Home Management;Aquatic Therapy;Cryotherapy;Moist Heat;Gait training;Stair training;Functional mobility training;Therapeutic activities;Therapeutic exercise;Balance training;Neuromuscular re-education;Patient/family education;Manual techniques;Passive range of motion;Dry needling;Taping;Vasopneumatic Device    PT Next Visit Plan assess response to HEP, balance assessment with SLS, progress LE strength    PT Home Exercise Plan Access Code: [facility]             Patient will benefit from skilled therapeutic intervention in order to improve the following deficits and impairments:  Abnormal gait, Cardiopulmonary status limiting activity, Decreased activity tolerance, Decreased balance, Decreased endurance, Decreased mobility, Decreased range of motion, Decreased strength, Difficulty walking, Dizziness, Postural dysfunction, Improper body mechanics, Obesity, Pain  Visit Diagnosis: Chronic low back pain, unspecified back pain laterality, unspecified whether sciatica present  Muscle weakness (generalized)  Other abnormalities of gait and mobility  Pain in left leg  Pain in right leg     Problem List Patient Active Problem List   Diagnosis Date Noted   Chest pain 02/24/2017   Atypical chest pain 05/10/2016   HTN (hypertension) 05/10/2016   Hypertensive emergency 05/10/2016   Anxiety 05/10/2016   Gastroesophageal reflux disease with esophagitis     Ward Chatters, PT 05/04/2021, 9:12 AM  [facility] [address] [place], [place], [address] Phone: [phone]   Fax:  [phone]  Name: Kristi [name] MRN: [id number] Date of Birth: 1955/08/05

## 2021-05-05 ENCOUNTER — Encounter (INDEPENDENT_AMBULATORY_CARE_PROVIDER_SITE_OTHER): Payer: Self-pay

## 2021-05-05 ENCOUNTER — Encounter: Payer: Self-pay | Admitting: Internal Medicine

## 2021-05-05 ENCOUNTER — Other Ambulatory Visit: Payer: Self-pay | Admitting: *Deleted

## 2021-05-05 ENCOUNTER — Ambulatory Visit: Payer: Medicare Other | Admitting: Internal Medicine

## 2021-05-05 VITALS — BP 110/72 | HR 87 | Ht 65.0 in | Wt 318.0 lb

## 2021-05-05 DIAGNOSIS — R0602 Shortness of breath: Secondary | ICD-10-CM | POA: Diagnosis not present

## 2021-05-05 DIAGNOSIS — R079 Chest pain, unspecified: Secondary | ICD-10-CM

## 2021-05-05 DIAGNOSIS — R072 Precordial pain: Secondary | ICD-10-CM

## 2021-05-05 DIAGNOSIS — R0600 Dyspnea, unspecified: Secondary | ICD-10-CM | POA: Insufficient documentation

## 2021-05-05 DIAGNOSIS — I10 Essential (primary) hypertension: Secondary | ICD-10-CM

## 2021-05-05 MED ORDER — PREDNISONE 50 MG PO TABS: ORAL_TABLET | ORAL | 0 refills | Status: DC

## 2021-05-05 MED ORDER — METOPROLOL TARTRATE 100 MG PO TABS: 100.0000 mg | ORAL_TABLET | Freq: Once | ORAL | 0 refills | Status: DC

## 2021-05-05 MED ORDER — DIPHENHYDRAMINE HCL 50 MG PO CAPS: ORAL_CAPSULE | ORAL | 0 refills | Status: DC

## 2021-05-05 NOTE — Assessment & Plan Note (Signed)
We will obtain coronary CTA study to evaluate for coronary atherosclerosis.  She has an IV dye allergy so she will need premedication.  Depending on the results of our testing this will guide further therapy.  Certainly if these are negative then other etiologies need to be evaluated.  There is certainly some atypical aspects to her complaints but there are also some more concerning features such as radiation down the left arm and that it is worse with exertion.  I will keep follow-up with me open-ended depending on the results of these tests.

## 2021-05-05 NOTE — Assessment & Plan Note (Signed)
I will obtain an echocardiogram to evaluate further.  She had mild aortic stenosis on her prior study.

## 2021-05-05 NOTE — Progress Notes (Signed)
g Cardiology Office Note:    Date:  05/05/2021   ID:  Kristi [name], DOB September 04, 1955, MRN [id number]  PCP:  Lauree Chandler, NP   [facility] Providers Cardiologist:  None     Referring MD: Lauree Chandler, NP   Chief Complaint:  Chest pain  History of Present Illness:    PROBLEM LIST: 1.  Hypertension 2.  Family history of early myocardial infarction 3.  Iodine allergy causing anaphylaxis 4.  Morbid obesity 5.  Mild aortic stenosis on echocardiogram 2017   Kristi [name] is a 65 y.o. female with the indicated medical problems here for recommendations regarding chest pain.  The patient had a nondiagnostic stress test a few years ago.  She was seen by her primary care provider and was complaining of shortness of breath and chest pain which has been relatively chronic.  The patient reports a constant chest pain mostly in her left upper chest.  There is sometimes radiation down her arm.  It is sometimes worse with exertion but is typically just constantly there.  Is not necessarily better with rest.  There is some tenderness to palpation component to it.  She had a stress test a few years ago which was relatively unremarkable.  She denies any presyncope or syncope.  She does get moderately short of breath with exertion.  She has lost about 30 pounds through dietary changes.  Her blood pressure is markedly improved versus a few months ago.  She has required no emergency room visits or hospitalizations.    Previous Medical/Surgical History:    Past Medical History:  Diagnosis Date   Angina at rest [facility])    Arthritis    Asthma    Depression    GERD (gastroesophageal reflux disease)    Hypertension    Migraines    Obesity     Past Surgical History:  Procedure Laterality Date   ABDOMINAL HYSTERECTOMY     APPENDECTOMY     CHOLECYSTECTOMY     GALLBLADDER SURGERY  1989    Current Medications: Current Meds  Medication Sig   acetaminophen (TYLENOL) 650 MG CR tablet  Take 1,950 mg by mouth every 6 (six) hours as needed for pain.   albuterol (PROVENTIL HFA;VENTOLIN HFA) 108 (90 Base) MCG/ACT inhaler Inhale 2 puffs into the lungs every 6 (six) hours as needed for wheezing or shortness of breath.   ALBUTEROL IN Inhale 4 puffs into the lungs 4 (four) times daily as needed.   amLODipine (NORVASC) 10 MG tablet Take 1 tablet (10 mg total) by mouth daily.   atorvastatin (LIPITOR) 10 MG tablet Take 1 tablet (10 mg total) by mouth daily.   benzonatate (TESSALON) 100 MG capsule Take 1 capsule (100 mg total) by mouth 3 (three) times daily as needed.   diclofenac (VOLTAREN) 75 MG EC tablet Take 75 mg by mouth 2 (two) times daily.   diphenhydrAMINE (BENADRYL) 50 MG capsule Take one capsule 1 hour prior to scan.   fluticasone (FLONASE) 50 MCG/ACT nasal spray Place 2 sprays into both nostrils daily.   Fluticasone-Umeclidin-Vilant (TRELEGY ELLIPTA) 100-62.5-25 MCG/INH AEPB Inhale 1 puff into the lungs in the morning.   Melatonin 10 MG TABS Take 1 tablet by mouth daily at 12 noon.   metoprolol tartrate (LOPRESSOR) 100 MG tablet Take 1 tablet (100 mg total) by mouth once for 1 dose. Take 90-120 minutes prior to scan.   pantoprazole (PROTONIX) 40 MG tablet Take 1 tablet (40 mg total) by mouth in the morning.  pramipexole (MIRAPEX) 0.25 MG tablet Take 0.25 mg by mouth at bedtime.   predniSONE (DELTASONE) 50 MG tablet Take one tablet 13 hours, 7 hours, and 1 hour prior to scan.   SUMAtriptan (IMITREX) 25 MG tablet TAKE 1 TABLET BY MOUTH EVERYDAY, MAY REPEAT IN 2 HOURS IF HEADACHE PERSISTS OR RECURS   venlafaxine XR (EFFEXOR-XR) 37.5 MG 24 hr capsule Take 1 capsule (37.5 mg total) by mouth daily with breakfast. DX F41.9, F33.9, G43.109   Vitamin D, Ergocalciferol, (DRISDOL) 1.25 MG (50000 UNIT) CAPS capsule Take 50,000 Units by mouth once a week.     Allergies:   Fish allergy, Iodine, Sulfur, and Elemental sulfur   Social History:    Social History   Socioeconomic History    Marital status: Divorced    Spouse name: Not on file   Number of children: Not on file   Years of education: Not on file   Highest education level: Not on file  Occupational History   Not on file  Tobacco Use   Smoking status: Former    Packs/day: 3.00    Types: Cigarettes    Quit date: 02/27/2006    Years since quitting: 15.1   Smokeless tobacco: Never  Vaping Use   Vaping Use: Never used  Substance and Sexual Activity   Alcohol use: No   Drug use: No   Sexual activity: Not on file  Other Topics Concern   Not on file  Social History Narrative   Tobacco use, amount per day now:   Past tobacco use, amount per day: 3 packs per day, stopped 02/27/2006   How many years did you use tobacco: 25 years   Alcohol use (drinks per week): None   Diet: Low salt, Low fat.   Do you drink/eat things with caffeine: Coffee   Marital status:  Divorced                                What year were you married?    Do you live in a house, apartment, assisted living, condo, trailer, etc.? House   Is it one or more stories?   How many persons live in your home? 3   Do you have pets in your home?( please list) Dog, and Cat   Highest Level of education completed? Bachelors.   Current or past profession: Radiation protection practitioner.   Do you exercise?   No                               Type and how often?   Do you have a living will? No   Do you have a DNR form?     No                               If not, do you want to discuss one?   Do you have signed POA/HPOA forms? No                        If so, please bring to you appointment      Do you have any difficulty bathing or dressing yourself? No    Do you have any difficulty preparing food or eating? No   Do you have any difficulty managing your medications? No   Do  you have any difficulty managing your finances? No   Do you have any difficulty affording your medications? Yes   Social Determinants of Health   Financial Resource Strain: Not  on file  Food Insecurity: Not on file  Transportation Needs: Not on file  Physical Activity: Not on file  Stress: Not on file  Social Connections: Not on file     Family History:  The patient's family history includes Cancer in her father and mother; Diabetes in her mother and another family member; Heart attack in her mother; Heart disease in her mother and another family member; High Cholesterol in her mother; Hypertension in her mother.  ROS:  Please see the history of present illness.  All other systems reviewed and are negative.  EKGs/Labs/Other Studies Reviewed:    The following studies were reviewed today: Stress test and echocardiogram as detailed above.  Her EKG from September this year demonstrated normal sinus rhythm no acute ST changes or Q waves.  EKG:   The ekg ordered today demonstrates normal sinus rhythm with nonpathologic Q waves inferiorly and no acute ischemic changes  Recent Labs: 03/16/2021: ALT 12; BUN 13; Creat 0.78; Hemoglobin 13.2; Platelets 341; Potassium 4.6; Sodium 141   Recent Lipid Panel    Component Value Date/Time   CHOL 243 (H) 03/16/2021 1209   TRIG 114 03/16/2021 1209   HDL 57 03/16/2021 1209   CHOLHDL 4.3 03/16/2021 1209   VLDL 26 02/25/2017 0245   LDLCALC 163 (H) 03/16/2021 1209     Risk Assessment/Calculations:           Physical Exam:    VS:  BP 110/72   Pulse 87   Ht 5\' 5"  (1.651 m)   Wt (!) 318 lb (144.2 kg)   SpO2 97%   BMI 52.92 kg/m     Wt Readings from Last 3 Encounters:  05/05/21 (!) 318 lb (144.2 kg)  03/16/21 (!) 325 lb (147.4 kg)  02/23/21 296 lb 12.8 oz (134.6 kg)     GEN:  Well nourished, well developed in no acute distress HEENT: Normal NECK: No JVD; No carotid bruits LYMPHATICS: No lymphadenopathy CARDIAC: RRR, no murmurs, rubs, gallops; and on palpation of left upper chest RESPIRATORY:  Clear to auscultation without rales, wheezing or rhonchi  ABDOMEN: Soft, non-tender, non-distended MUSCULOSKELETAL:   No edema; No deformity  SKIN: Warm and dry NEUROLOGIC:  Alert and oriented x 3 PSYCHIATRIC:  Normal affect   ASSESSMENT and PLAN   Morbid obesity ([place]) She has lost significant amount of weight with dietary changes.  I applauded her efforts.  Dyspnea I will obtain an echocardiogram to evaluate further.  She had mild aortic stenosis on her prior study.  HTN (hypertension) Blood pressures well controlled on her current regimen.  Chest pain We will obtain coronary CTA study to evaluate for coronary atherosclerosis.  She has an IV dye allergy so she will need premedication.  Depending on the results of our testing this will guide further therapy.  Certainly if these are negative then other etiologies need to be evaluated.  There is certainly some atypical aspects to her complaints but there are also some more concerning features such as radiation down the left arm and that it is worse with exertion.  I will keep follow-up with me open-ended depending on the results of these tests.             Medication Adjustments/Labs and Tests Ordered: Current medicines are reviewed at length with the patient today.  Concerns regarding medicines are outlined above.  Orders Placed This Encounter  Procedures   CT CORONARY MORPH W/CTA COR W/SCORE W/CA W/CM &/OR WO/CM   Basic metabolic panel   EKG 38-LHTD   ECHOCARDIOGRAM COMPLETE    Meds ordered this encounter  Medications   metoprolol tartrate (LOPRESSOR) 100 MG tablet    Sig: Take 1 tablet (100 mg total) by mouth once for 1 dose. Take 90-120 minutes prior to scan.    Dispense:  1 tablet    Refill:  0   predniSONE (DELTASONE) 50 MG tablet    Sig: Take one tablet 13 hours, 7 hours, and 1 hour prior to scan.    Dispense:  3 tablet    Refill:  0   diphenhydrAMINE (BENADRYL) 50 MG capsule    Sig: Take one capsule 1 hour prior to scan.    Dispense:  1 capsule    Refill:  0     Patient Instructions  Medication Instructions:  No changes  today *If you need a refill on your cardiac medications before your next appointment, please call your pharmacy*   Lab Work: Today: BMET If you have labs (blood work) drawn today and your tests are completely normal, you will receive your results only by: [place] (if you have MyChart) OR A paper copy in the mail If you have any lab test that is abnormal or we need to change your treatment, we will call you to review the results.   Testing/Procedures: Your physician has requested that you have an echocardiogram. Echocardiography is a painless test that uses sound waves to create images of your heart. It provides your doctor with information about the size and shape of your heart and how well your heart's chambers and valves are working. This procedure takes approximately one hour. There are no restrictions for this procedure.  Cardiac CT - see instructions below   Follow-Up: Depends on test results for now.  Other Instructions   Your cardiac CT will be scheduled at one of the below locations:   [facility] [address] [place], [place] [address] [phone]   Please arrive at the [facility] main entrance (entrance A) of [facility] 30 minutes prior to test start time. You can use the FREE valet parking offered at the main entrance (encouraged to control the heart rate for the test) Proceed to the [facility] Radiology Department (first floor) to check-in and test prep.   Please follow these instructions carefully (unless otherwise directed):   On the Night Before the Test: Be sure to Drink plenty of water. Do not consume any caffeinated/decaffeinated beverages or chocolate 12 hours prior to your test. Do not take any antihistamines 12 hours prior to your test. For your contrast allergy: follow the directions below: Prednisone 50 mg -   Take Prednisone 50 mg 13 hours prior to test Take another Prednisone 50 mg 7 hours prior to test Take  another Prednisone 50 mg 1 hour prior to test Take Benadryl 50 mg 1 hour prior to test Patient must complete all four doses of above prophylactic medications. Patient will need a ride after test due to Benadryl.  On the Day of the Test: Drink plenty of water until 1 hour prior to the test. Do not eat any food 4 hours prior to the test. You may take your regular medications prior to the test.  Take metoprolol (Lopressor) two hours prior to test. HOLD Furosemide/Hydrochlorothiazide morning of the test.  FEMALES- please wear underwire-free bra if available, avoid dresses & tight clothing       After the Test: Drink plenty of water. After receiving IV contrast, you may experience a mild flushed feeling. This is normal. On occasion, you may experience a mild rash up to 24 hours after the test. This is not dangerous. If this occurs, you can take Benadryl 25 mg and increase your fluid intake. If you experience trouble breathing, this can be serious. If it is severe call 911 IMMEDIATELY. If it is mild, please call our office. If you take any of these medications: Glipizide/Metformin, Avandament, Glucavance, please do not take 48 hours after completing test unless otherwise instructed.  Please allow 2-4 weeks for scheduling of routine cardiac CTs. Some insurance companies require a pre-authorization which may delay scheduling of this test.   For non-scheduling related questions, please contact the cardiac imaging nurse navigator should you have any questions/concerns: Marchia Bond, Cardiac Imaging Nurse Navigator Gordy Clement, Cardiac Imaging Nurse Navigator [place] [facility] Direct Office Dial: [phone]   For scheduling needs, including cancellations and rescheduling, please call [place], [phone].   Signed, Early Osmond, MD  05/05/2021 3:04 PM    [place]

## 2021-05-05 NOTE — Assessment & Plan Note (Addendum)
She has lost significant amount of weight with dietary changes.  I applauded her efforts.

## 2021-05-05 NOTE — Patient Instructions (Addendum)
Medication Instructions:  No changes today *If you need a refill on your cardiac medications before your next appointment, please call your pharmacy*   Lab Work: Today: BMET If you have labs (blood work) drawn today and your tests are completely normal, you will receive your results only by: [place] (if you have MyChart) OR A paper copy in the mail If you have any lab test that is abnormal or we need to change your treatment, we will call you to review the results.   Testing/Procedures: Your physician has requested that you have an echocardiogram. Echocardiography is a painless test that uses sound waves to create images of your heart. It provides your doctor with information about the size and shape of your heart and how well your heart's chambers and valves are working. This procedure takes approximately one hour. There are no restrictions for this procedure.  Cardiac CT - see instructions below   Follow-Up: Depends on test results for now.  Other Instructions   Your cardiac CT will be scheduled at one of the below locations:   [facility] [address] [place],  [address] [phone]   Please arrive at the [facility] main entrance (entrance A) of [facility] 30 minutes prior to test start time. You can use the FREE valet parking offered at the main entrance (encouraged to control the heart rate for the test) Proceed to the [facility] Radiology Department (first floor) to check-in and test prep.   Please follow these instructions carefully (unless otherwise directed):   On the Night Before the Test: Be sure to Drink plenty of water. Do not consume any caffeinated/decaffeinated beverages or chocolate 12 hours prior to your test. Do not take any antihistamines 12 hours prior to your test. For your contrast allergy: follow the directions below: Prednisone 50 mg -   Take Prednisone 50 mg 13 hours prior to test Take another  Prednisone 50 mg 7 hours prior to test Take another Prednisone 50 mg 1 hour prior to test Take Benadryl 50 mg 1 hour prior to test Patient must complete all four doses of above prophylactic medications. Patient will need a ride after test due to Benadryl.  On the Day of the Test: Drink plenty of water until 1 hour prior to the test. Do not eat any food 4 hours prior to the test. You may take your regular medications prior to the test.  Take metoprolol (Lopressor) two hours prior to test. HOLD Furosemide/Hydrochlorothiazide morning of the test. FEMALES- please wear underwire-free bra if available, avoid dresses & tight clothing       After the Test: Drink plenty of water. After receiving IV contrast, you may experience a mild flushed feeling. This is normal. On occasion, you may experience a mild rash up to 24 hours after the test. This is not dangerous. If this occurs, you can take Benadryl 25 mg and increase your fluid intake. If you experience trouble breathing, this can be serious. If it is severe call 911 IMMEDIATELY. If it is mild, please call our office. If you take any of these medications: Glipizide/Metformin, Avandament, Glucavance, please do not take 48 hours after completing test unless otherwise instructed.  Please allow 2-4 weeks for scheduling of routine cardiac CTs. Some insurance companies require a pre-authorization which may delay scheduling of this test.   For non-scheduling related questions, please contact the cardiac imaging nurse navigator should you have any questions/concerns: Marchia Bond, Cardiac Imaging Nurse Navigator Kerin Ransom  Jearld Shines, Cardiac Imaging Nurse Navigator [place] [facility] Direct Office Dial: [phone]   For scheduling needs, including cancellations and rescheduling, please call [place], [phone].

## 2021-05-05 NOTE — Assessment & Plan Note (Signed)
Blood pressures well controlled on her current regimen.

## 2021-05-06 LAB — SPECIMEN STATUS REPORT

## 2021-05-06 LAB — BASIC METABOLIC PANEL
BUN/Creatinine Ratio: 13 (ref 12–28)
BUN: 11 mg/dL (ref 8–27)
CO2: 25 mmol/L (ref 20–29)
Calcium: 8.9 mg/dL (ref 8.7–10.3)
Chloride: 100 mmol/L (ref 96–106)
Creatinine, Ser: 0.85 mg/dL (ref 0.57–1.00)
Glucose: 78 mg/dL (ref 70–99)
Potassium: 4.5 mmol/L (ref 3.5–5.2)
Sodium: 141 mmol/L (ref 134–144)
eGFR: 76 mL/min/{1.73_m2} (ref >59)

## 2021-05-07 ENCOUNTER — Ambulatory Visit: Payer: Medicare Other | Admitting: Physical Therapy

## 2021-05-11 DIAGNOSIS — Z1231 Encounter for screening mammogram for malignant neoplasm of breast: Secondary | ICD-10-CM

## 2021-05-12 ENCOUNTER — Other Ambulatory Visit: Payer: Self-pay | Admitting: Nurse Practitioner

## 2021-05-12 DIAGNOSIS — Z1231 Encounter for screening mammogram for malignant neoplasm of breast: Secondary | ICD-10-CM

## 2021-05-13 ENCOUNTER — Telehealth (HOSPITAL_COMMUNITY): Payer: Self-pay | Admitting: Emergency Medicine

## 2021-05-13 NOTE — Telephone Encounter (Signed)
Reaching out to patient to offer assistance regarding upcoming cardiac imaging study; pt verbalizes understanding of appt date/time, parking situation and where to check in, pre-test NPO status and medications ordered, and verified current allergies; name and call back number provided for further questions should they arise Marchia Bond RN Navigator Cardiac Imaging [facility] [phone] office [phone] cell  100mg  metoprolol + 13 hr prep Difficult veins

## 2021-05-13 NOTE — Telephone Encounter (Signed)
Attempted to call patient regarding upcoming cardiac CT appointment. °Left message on voicemail with name and callback number °Kenitra Leventhal RN Navigator Cardiac Imaging °[facility] °[phone] Office °[phone] Cell ° °

## 2021-05-14 ENCOUNTER — Ambulatory Visit
Admission: RE | Admit: 2021-05-14 | Discharge: 2021-05-14 | Disposition: A | Discharge status: Home / Self Care | Payer: Medicare Other | Source: Ambulatory Visit | Attending: Internal Medicine | Admitting: Internal Medicine

## 2021-05-14 ENCOUNTER — Other Ambulatory Visit: Payer: Self-pay

## 2021-05-14 ENCOUNTER — Telehealth: Payer: Self-pay | Admitting: *Deleted

## 2021-05-14 ENCOUNTER — Ambulatory Visit
Admission: RE | Admit: 2021-05-14 | Discharge: 2021-05-14 | Disposition: A | Discharge status: Home / Self Care | Payer: Medicare Other | Source: Ambulatory Visit | Attending: Nurse Practitioner | Admitting: Nurse Practitioner

## 2021-05-14 DIAGNOSIS — E2839 Other primary ovarian failure: Secondary | ICD-10-CM

## 2021-05-14 DIAGNOSIS — G43109 Migraine with aura, not intractable, without status migrainosus: Secondary | ICD-10-CM

## 2021-05-14 DIAGNOSIS — Z1231 Encounter for screening mammogram for malignant neoplasm of breast: Secondary | ICD-10-CM

## 2021-05-14 DIAGNOSIS — Z780 Asymptomatic menopausal state: Secondary | ICD-10-CM | POA: Diagnosis not present

## 2021-05-14 MED ORDER — SUMATRIPTAN SUCCINATE 25 MG PO TABS: ORAL_TABLET | ORAL | 0 refills | Status: DC

## 2021-05-14 NOTE — Telephone Encounter (Signed)
Deb with [employer] [phone] called and stated that they received a transferred Rx for Sumatriptan 25mg .   They need clarification on the Max # of tablets to be taken in a 24 hour period.   Please Advise.

## 2021-05-14 NOTE — Telephone Encounter (Signed)
New Rx sent to pharmacy

## 2021-05-15 ENCOUNTER — Telehealth (HOSPITAL_COMMUNITY): Payer: Self-pay | Admitting: Emergency Medicine

## 2021-05-15 ENCOUNTER — Ambulatory Visit (HOSPITAL_COMMUNITY): Admission: RE | Admit: 2021-05-15 | Payer: Medicare Other | Source: Ambulatory Visit

## 2021-05-15 DIAGNOSIS — R079 Chest pain, unspecified: Secondary | ICD-10-CM

## 2021-05-15 DIAGNOSIS — Z91041 Radiographic dye allergy status: Secondary | ICD-10-CM

## 2021-05-15 MED ORDER — PREDNISONE 50 MG PO TABS: ORAL_TABLET | ORAL | 0 refills | Status: DC

## 2021-05-15 NOTE — Telephone Encounter (Signed)
Pt reports the cost due at time of service is more than she can afford at this time, shes requesting we postpone her test until she gets paid. Shes already started taking 13 hr prep so I will represcribe that to her pharmacy.   New appt made on the same day as echo so she only has to take one day off from work   05/26/21 at 3:15pm  [facility] [phone] Office  [phone] Cell

## 2021-05-16 DIAGNOSIS — Z1231 Encounter for screening mammogram for malignant neoplasm of breast: Secondary | ICD-10-CM

## 2021-05-20 ENCOUNTER — Ambulatory Visit: Payer: Medicare Other | Attending: Nurse Practitioner

## 2021-05-20 ENCOUNTER — Other Ambulatory Visit: Payer: Self-pay

## 2021-05-20 DIAGNOSIS — M79605 Pain in left leg: Secondary | ICD-10-CM | POA: Diagnosis not present

## 2021-05-20 DIAGNOSIS — R2689 Other abnormalities of gait and mobility: Secondary | ICD-10-CM | POA: Diagnosis not present

## 2021-05-20 DIAGNOSIS — M545 Low back pain, unspecified: Secondary | ICD-10-CM | POA: Diagnosis not present

## 2021-05-20 DIAGNOSIS — G8929 Other chronic pain: Secondary | ICD-10-CM | POA: Insufficient documentation

## 2021-05-20 DIAGNOSIS — M79604 Pain in right leg: Secondary | ICD-10-CM | POA: Insufficient documentation

## 2021-05-20 DIAGNOSIS — M6281 Muscle weakness (generalized): Secondary | ICD-10-CM | POA: Insufficient documentation

## 2021-05-20 NOTE — Therapy (Signed)
[place], [place], [address] Phone: [phone]   Fax:  [phone]  Physical Therapy Treatment  Patient Details  Name: Kristi [name] MRN: [id number] Date of Birth: 05/14/1956 Referring Provider (PT): Lauree Chandler, NP   Encounter Date: 05/20/2021   PT End of Session - 05/20/21 0909     Visit Number 7    Number of Visits 17    Date for PT Re-Evaluation 07/15/21    Authorization Type UHC MCR    PT Start Time 0915    PT Stop Time 0954    PT Time Calculation (min) 39 min    Activity Tolerance Patient tolerated treatment well    Behavior During Therapy [facility] for tasks assessed/performed             Past Medical History:  Diagnosis Date   Angina at rest [facility])    Arthritis    Asthma    Depression    GERD (gastroesophageal reflux disease)    Hypertension    Migraines    Obesity     Past Surgical History:  Procedure Laterality Date   [place]    There were no vitals filed for this visit.   Subjective Assessment - 05/20/21 0918     Subjective Pt presents to PT with reports of continued lower back and L knee pain. She has continued to be compliant with her HEP and notes that she feels more confident in balance and stronger overall. She is ready to begin PT treatment at this time.    Currently in Pain? Yes    Pain Score 8     Pain Location Back    Pain Orientation Lower    Pain Type Chronic pain    Pain Score 6    Pain Location Knee    Pain Orientation Left           [facility] Adult PT Treatment/Exercise:   Therapeutic Exercise:  STS 2x10 - no UE support LAQ 2x10 - 5lbs ea Seated clamshell 2x15 black tband Seated march 2x20 black tband Seated hamstring curl blue tband 2x10 ea   Past Interventions Not Performed Today: NuStep lvl 5 x 4 min UE/LE Seated ball squeeze 2x10 - 5 sec hold Seated  clamshell 2x15 green tband Seated hamstring stretch 2x30 sec Seated march 2x20 4lbs Supine PPT x 10 - 5 sec hold Supine PPT w/ ball squeeze 2x10 - 5 sec Supine PPT w/ clamshell2x10 Supine PPT w/ march x 20 blue tband Supine bent knee fallout x 10 ea blue tband     [facility] PT Assessment - 05/20/21 0001       Observation/Other Assessments   Focus on Therapeutic Outcomes (FOTO)  41% function; 49% predicted      Sit to Stand   Comments 30 Sec STS: 6 reps      Timed Up and Go Test   Normal TUG (seconds) 12                                      PT Short Term Goals - 05/04/21 0045       PT SHORT TERM GOAL #1   Title Pt will be compliant with initial HEP for improved carryover between session    Baseline initial HEP given  Time 3    Period Weeks    Status Achieved    Target Date 04/27/21               PT Long Term Goals - 05/20/21 0920       PT LONG TERM GOAL #1   Title Pt will improve FOTO score to no less than 49% as proxy for functional improvement    Baseline 40% function; 39% function on 10/31; 41% function    Time 8    Period Weeks    Status On-going    Target Date 07/15/21      PT LONG TERM GOAL #2   Title Pt will increase 30 Sec STS to no less than 8 reps for improved comfort and functional mobility    Baseline 3 reps; 6 reps on 11/16 - increased goal to 8 reps    Time 8    Period Weeks    Status Partially Met    Target Date 07/15/21      PT LONG TERM GOAL #3   Title Pt will decrease TUG to no greater than 12 seconds for improved balance and mobility    Baseline 17 seconds; 12 seconds on    Time 8    Period Weeks    Status Achieved    Target Date --      PT LONG TERM GOAL #4   Title Pt will self report generalized pain no greater than 3/10 at worst for improved comfort and function    Baseline 10/10 at worst in multiple joints; 8/10 at worst on 11/16    Time 8    Period Weeks    Status On-going    Target Date  07/15/21      PT LONG TERM GOAL #5   Title Pt will be able to perform floor>standing transfer mod I with no increase in pain    Baseline unable    Time 8    Period Weeks    Status New    Target Date 07/15/21                   Plan - 05/20/21 0937     Clinical Impression Statement Pt was able to complete all prescribed exercises with no adverse effect or change in baseline. Over the course of PT treatment, she has made great progress in improving LE strength and general functional mobility. She has met LTGs for TUG and 30 Sec STS, decreasing fall risk and improving mobility. She does have continued pain in lower back and bilateral knees, while FOTO score has also reminded consistent. She would continue to benefit from skilled PT services, with additional work on improving safety with floor transfers. Will continue to progress as tolerated per POC.    PT Treatment/Interventions ADLs/Self Care Home Management;Aquatic Therapy;Cryotherapy;Moist Heat;Gait training;Stair training;Functional mobility training;Therapeutic activities;Therapeutic exercise;Balance training;Neuromuscular re-education;Patient/family education;Manual techniques;Passive range of motion;Dry needling;Taping;Vasopneumatic Device    PT Next Visit Plan work on floor to mat transfer; progress core and LE strength    PT Home Exercise Plan Access Code: [facility]             Patient will benefit from skilled therapeutic intervention in order to improve the following deficits and impairments:  Abnormal gait, Cardiopulmonary status limiting activity, Decreased activity tolerance, Decreased balance, Decreased endurance, Decreased mobility, Decreased range of motion, Decreased strength, Difficulty walking, Dizziness, Postural dysfunction, Improper body mechanics, Obesity, Pain  Visit Diagnosis: Chronic low back pain, unspecified back pain laterality, unspecified whether  sciatica present - Plan: PT plan of care  cert/re-cert  Muscle weakness (generalized) - Plan: PT plan of care cert/re-cert  Other abnormalities of gait and mobility - Plan: PT plan of care cert/re-cert  Pain in left leg - Plan: PT plan of care cert/re-cert  Pain in right leg - Plan: PT plan of care cert/re-cert     Problem List Patient Active Problem List   Diagnosis Date Noted   Morbid obesity ([place]) 05/05/2021   Dyspnea 05/05/2021   Chest pain 02/24/2017   Atypical chest pain 05/10/2016   HTN (hypertension) 05/10/2016   Hypertensive emergency 05/10/2016   Anxiety 05/10/2016   Gastroesophageal reflux disease with esophagitis     Ward Chatters, PT 05/20/2021, 9:55 AM  [facility] [address] [place], [place], [address] Phone: [phone]   Fax:  [phone]  Name: Kristi [name] MRN: [id number] Date of Birth: 1955/09/27

## 2021-05-25 ENCOUNTER — Telehealth (HOSPITAL_COMMUNITY): Payer: Self-pay | Admitting: *Deleted

## 2021-05-25 NOTE — Telephone Encounter (Signed)
Patient returning call regarding upcoming cardiac imaging study; pt verbalizes understanding of appt date/time, parking situation and where to check in, pre-test NPO status and medications ordered, and verified current allergies; name and call back number provided for further questions should they arise  [facility] [phone] office [phone] cell  Patient verbalized understanding of how take 13 hour prep and 100mg  metoprolol tartrate two hours prior to cardiac CT.  She is aware to arrive at 2:45pm for her 3:15pm scan. She states she's had a CT scan with prep and did not react.

## 2021-05-25 NOTE — Telephone Encounter (Signed)
Attempted to call patient regarding upcoming cardiac CT appointment. °Left message on voicemail with name and callback number ° °Numa Schroeter RN Navigator Cardiac Imaging °[facility] °[phone] Office °[phone] Cell ° °

## 2021-05-26 ENCOUNTER — Ambulatory Visit (HOSPITAL_COMMUNITY): Payer: Medicare Other | Attending: Internal Medicine

## 2021-05-26 ENCOUNTER — Other Ambulatory Visit: Payer: Self-pay

## 2021-05-26 ENCOUNTER — Other Ambulatory Visit (HOSPITAL_COMMUNITY): Payer: Self-pay | Admitting: Emergency Medicine

## 2021-05-26 ENCOUNTER — Ambulatory Visit (HOSPITAL_COMMUNITY): Admission: RE | Admit: 2021-05-26 | Payer: Medicare Other | Source: Ambulatory Visit

## 2021-05-26 DIAGNOSIS — I10 Essential (primary) hypertension: Secondary | ICD-10-CM

## 2021-05-26 DIAGNOSIS — R0602 Shortness of breath: Secondary | ICD-10-CM | POA: Diagnosis not present

## 2021-05-26 DIAGNOSIS — R072 Precordial pain: Secondary | ICD-10-CM | POA: Diagnosis not present

## 2021-05-26 DIAGNOSIS — R079 Chest pain, unspecified: Secondary | ICD-10-CM | POA: Diagnosis not present

## 2021-05-26 DIAGNOSIS — Z91041 Radiographic dye allergy status: Secondary | ICD-10-CM

## 2021-05-26 LAB — ECHOCARDIOGRAM COMPLETE
Area-P 1/2: 4.07 cm2
S' Lateral: 2.6 cm

## 2021-05-26 MED ORDER — PREDNISONE 50 MG PO TABS: ORAL_TABLET | ORAL | 0 refills | Status: DC

## 2021-05-26 MED ORDER — PERFLUTREN LIPID MICROSPHERE
1.0000 mL | INTRAVENOUS | Status: AC | PRN
  Administered 2021-05-26: 3 mL via INTRAVENOUS

## 2021-05-26 NOTE — Progress Notes (Addendum)
Pt calling stating she forgot she was not supposed to have coffee today prior to CCTA. Also states she forgot to take 2nd dose of prednisone.  I got patient to take BP/HR while we were on the phone and pt stated her HR was 106bpm.  I suggested that we r/s her appt when shes not had caffeine and was able to take her pre-meds on time for contrast allergy mitigation. Pt agreed.  New appt for 12/6 at 7:45a I resubmitted prednisone to her pharm on file.  Marchia Bond RN Navigator Cardiac Imaging [facility] [phone] Office  [phone] Cell

## 2021-06-03 ENCOUNTER — Ambulatory Visit: Payer: Medicare Other | Admitting: Physical Therapy

## 2021-06-03 ENCOUNTER — Telehealth: Payer: Self-pay | Admitting: Physical Therapy

## 2021-06-03 NOTE — Telephone Encounter (Signed)
Left voicemail regarding no show to appointment. Left next appointment time and requested she call clinic to cancel or reschedule.

## 2021-06-05 ENCOUNTER — Telehealth (HOSPITAL_COMMUNITY): Payer: Self-pay | Admitting: *Deleted

## 2021-06-05 ENCOUNTER — Ambulatory Visit: Payer: Medicare Other

## 2021-06-05 NOTE — Telephone Encounter (Signed)
Reaching out to patient to offer assistance regarding upcoming cardiac imaging study; pt verbalizes understanding of appt date/time, parking situation and where to check in, pre-test NPO status and medications ordered, and verified current allergies; name and call back number provided for further questions should they arise  Kristi Clement RN Navigator Cardiac [facility] [facility] [phone] office [phone] cell  Patient verbalized understanding of how to take her 13hr prep and metoprolol for test.  She is aware to arrive at 7:15am for her 7:45am scan. She states she had small veins.

## 2021-06-08 ENCOUNTER — Ambulatory Visit: Payer: Medicare Other | Attending: Nurse Practitioner

## 2021-06-08 ENCOUNTER — Other Ambulatory Visit: Payer: Self-pay

## 2021-06-08 DIAGNOSIS — R2689 Other abnormalities of gait and mobility: Secondary | ICD-10-CM | POA: Diagnosis present

## 2021-06-08 DIAGNOSIS — M6281 Muscle weakness (generalized): Secondary | ICD-10-CM

## 2021-06-08 DIAGNOSIS — M79604 Pain in right leg: Secondary | ICD-10-CM | POA: Diagnosis present

## 2021-06-08 DIAGNOSIS — G8929 Other chronic pain: Secondary | ICD-10-CM | POA: Insufficient documentation

## 2021-06-08 DIAGNOSIS — M545 Low back pain, unspecified: Secondary | ICD-10-CM | POA: Diagnosis not present

## 2021-06-08 DIAGNOSIS — M79605 Pain in left leg: Secondary | ICD-10-CM

## 2021-06-08 NOTE — Therapy (Signed)
[place], [place], [address] Phone: [phone]   Fax:  [phone]  Physical Therapy Treatment  Patient Details  Name: Kristi [name] MRN: [id number] Date of Birth: [birth year] Referring Provider (PT): Lauree Chandler, NP   Encounter Date: 06/08/2021   PT End of Session - 06/08/21 1132     Visit Number 8    Number of Visits 17    Date for PT Re-Evaluation 07/15/21    Authorization Type UHC MCR    PT Start Time 1133    PT Stop Time 1215    PT Time Calculation (min) 42 min    Activity Tolerance Patient tolerated treatment well    Behavior During Therapy WFL for tasks assessed/performed             Past Medical History:  Diagnosis Date   Angina at rest [facility])    Arthritis    Asthma    Depression    GERD (gastroesophageal reflux disease)    Hypertension    Migraines    Obesity     Past Surgical History:  Procedure Laterality Date   [facility]    There were no vitals filed for this visit.   Subjective Assessment - 06/08/21 1133     Subjective Pt presents to PT with reports of increased L knee pain and slight R knee pain. She hasn't been as compliant with HEP due to pain from oral surgery last week. Pt is ready to begin PT treatment at this time.    Currently in Pain? Yes    Pain Score 8     Pain Location Knee    Pain Orientation Left           [facility] Adult PT Treatment/Exercise:   Therapeutic Exercise:  NuStep lvl 5 x 5 min UE/LE while taking subjective STS 2x10 - no UE support LAQ 2x10 - 5lbs ea Seated march 2x20 5lbs Standing hip abd 2x10 YTB Seated clamshell 2x15 black tband Seated hamstring curl black tband 2x10 ea  Manual Therapy: N/A   Neuromuscular re-ed: N/A   Therapeutic Activity: Practiced mat<>floor transfer x 2 with VC for sequencing into tall and half kneeling (used folded  tumble mat for practice)   Modalities: N/A   Self Care: N/A   Consider / progression for next session:                              PT Education - 06/08/21 1216     Education Details chair<>floor transfer; HEP update    Person(s) Educated Patient    Methods Explanation;Demonstration;Handout    Comprehension Returned demonstration;Verbalized understanding              PT Short Term Goals - 05/04/21 0858       PT SHORT TERM GOAL #1   Title Pt will be compliant with initial HEP for improved carryover between session    Baseline initial HEP given    Time 3    Period Weeks    Status Achieved    Target Date 04/27/21               PT Long Term Goals - 05/20/21 0920       PT LONG TERM GOAL #1   Title Pt will improve FOTO score to no less than 49%  as proxy for functional improvement    Baseline 40% function; 39% function on 10/31; 41% function    Time 8    Period Weeks    Status On-going    Target Date 07/15/21      PT LONG TERM GOAL #2   Title Pt will increase 30 Sec STS to no less than 8 reps for improved comfort and functional mobility    Baseline 3 reps; 6 reps on 11/16 - increased goal to 8 reps    Time 8    Period Weeks    Status Partially Met    Target Date 07/15/21      PT LONG TERM GOAL #3   Title Pt will decrease TUG to no greater than 12 seconds for improved balance and mobility    Baseline 17 seconds; 12 seconds on    Time 8    Period Weeks    Status Achieved    Target Date --      PT LONG TERM GOAL #4   Title Pt will self report generalized pain no greater than 3/10 at worst for improved comfort and function    Baseline 10/10 at worst in multiple joints; 8/10 at worst on 11/16    Time 8    Period Weeks    Status On-going    Target Date 07/15/21      PT LONG TERM GOAL #5   Title Pt will be able to perform floor>standing transfer mod I with no increase in pain    Baseline unable    Time 8    Period Weeks     Status New    Target Date 07/15/21                   Plan - 06/08/21 1139     Clinical Impression Statement Pt was able to complete prescribed exercises with no adverse effect or increase in pain. She showed modified independence with mat>floor transfers and handout was given for home practice. Therapy today also continued to focus on improving LE strength in order to decrease pain and improve functional mobility. She continues to progress well with therapy and should continue to be seen and progressed as tolerated.    PT Treatment/Interventions ADLs/Self Care Home Management;Aquatic Therapy;Cryotherapy;Moist Heat;Gait training;Stair training;Functional mobility training;Therapeutic activities;Therapeutic exercise;Balance training;Neuromuscular re-education;Patient/family education;Manual techniques;Passive range of motion;Dry needling;Taping;Vasopneumatic Device    PT Next Visit Plan work on floor to mat transfer; progress core and LE strength    PT Home Exercise Plan Access Code: [facility]             Patient will benefit from skilled therapeutic intervention in order to improve the following deficits and impairments:  Abnormal gait, Cardiopulmonary status limiting activity, Decreased activity tolerance, Decreased balance, Decreased endurance, Decreased mobility, Decreased range of motion, Decreased strength, Difficulty walking, Dizziness, Postural dysfunction, Improper body mechanics, Obesity, Pain  Visit Diagnosis: Chronic low back pain, unspecified back pain laterality, unspecified whether sciatica present  Muscle weakness (generalized)  Other abnormalities of gait and mobility  Pain in left leg  Pain in right leg     Problem List Patient Active Problem List   Diagnosis Date Noted   Morbid obesity ([place]) 05/05/2021   Dyspnea 05/05/2021   Chest pain 02/24/2017   Atypical chest pain 05/10/2016   HTN (hypertension) 05/10/2016   Hypertensive emergency 05/10/2016    Anxiety 05/10/2016   Gastroesophageal reflux disease with esophagitis     Ward Chatters, PT 06/08/2021, 12:21 PM  [place], [place], [address] Phone: [phone]   Fax:  [phone]  Name: Kristi [name] MRN: [id number] Date of Birth: Jul 15, 1955

## 2021-06-09 ENCOUNTER — Ambulatory Visit (HOSPITAL_COMMUNITY)
Admission: RE | Admit: 2021-06-09 | Discharge: 2021-06-09 | Disposition: A | Discharge status: Home / Self Care | Payer: Medicare Other | Source: Ambulatory Visit | Attending: Internal Medicine | Admitting: Internal Medicine

## 2021-06-09 DIAGNOSIS — R079 Chest pain, unspecified: Secondary | ICD-10-CM | POA: Diagnosis present

## 2021-06-09 DIAGNOSIS — R072 Precordial pain: Secondary | ICD-10-CM | POA: Diagnosis present

## 2021-06-09 LAB — POCT I-STAT CREATININE: Creatinine, Ser: 0.8 mg/dL (ref 0.44–1.00)

## 2021-06-09 MED ORDER — NITROGLYCERIN 0.4 MG SL SUBL
0.8000 mg | SUBLINGUAL_TABLET | Freq: Once | SUBLINGUAL | Status: AC
  Administered 2021-06-09: 0.8 mg via SUBLINGUAL

## 2021-06-09 MED ORDER — IOHEXOL 350 MG/ML SOLN
100.0000 mL | Freq: Once | INTRAVENOUS | Status: AC | PRN
  Administered 2021-06-09: 100 mL via INTRAVENOUS

## 2021-06-09 MED ORDER — DILTIAZEM HCL 25 MG/5ML IV SOLN
INTRAVENOUS | Status: AC
  Administered 2021-06-09: 10 mg via INTRAVENOUS
  Filled 2021-06-09: qty 5

## 2021-06-09 MED ORDER — DILTIAZEM HCL 25 MG/5ML IV SOLN: 10.0000 mg | Freq: Once | INTRAVENOUS | Status: AC

## 2021-06-09 MED ORDER — METOPROLOL TARTRATE 5 MG/5ML IV SOLN
INTRAVENOUS | Status: AC
  Administered 2021-06-09: 5 mg via INTRAVENOUS
  Filled 2021-06-09: qty 15

## 2021-06-09 MED ORDER — NITROGLYCERIN 0.4 MG SL SUBL
SUBLINGUAL_TABLET | SUBLINGUAL | Status: AC
  Filled 2021-06-09: qty 2

## 2021-06-09 MED ORDER — METOPROLOL TARTRATE 5 MG/5ML IV SOLN
5.0000 mg | INTRAVENOUS | Status: DC | PRN
  Administered 2021-06-09: 5 mg via INTRAVENOUS

## 2021-06-10 ENCOUNTER — Ambulatory Visit: Payer: Medicare Other

## 2021-06-10 ENCOUNTER — Telehealth: Payer: Self-pay | Admitting: *Deleted

## 2021-06-10 MED ORDER — ASPIRIN EC 81 MG PO TBEC: 81.0000 mg | DELAYED_RELEASE_TABLET | Freq: Every day | ORAL | 3 refills | Status: AC

## 2021-06-10 NOTE — Telephone Encounter (Signed)
-----   Message from Early Osmond, MD sent at 06/09/2021  7:35 PM EST ----- Let her know coronary CTA shows minimal blockages, please start ASA 81mg  and cont statin, no further cardiac eval is necessary.  Thank you.

## 2021-06-10 NOTE — Telephone Encounter (Signed)
Reviewed with the patient.  She voices understanding. Forwarded to her MyChart for review as well.  Pt grateful for information. Will start asa and continue Lipitor.

## 2021-06-15 ENCOUNTER — Ambulatory Visit: Payer: Medicare Other

## 2021-06-15 ENCOUNTER — Telehealth: Payer: Self-pay

## 2021-06-15 NOTE — Telephone Encounter (Signed)
PT called and left voicemail regarding missed visit. Reminded her of next appointment and attendance policy.  Ward Chatters, PT, DPT 06/15/21 12:55 PM

## 2021-06-17 ENCOUNTER — Other Ambulatory Visit: Payer: Self-pay

## 2021-06-17 ENCOUNTER — Ambulatory Visit: Payer: Medicare Other

## 2021-06-17 DIAGNOSIS — M79605 Pain in left leg: Secondary | ICD-10-CM

## 2021-06-17 DIAGNOSIS — R2689 Other abnormalities of gait and mobility: Secondary | ICD-10-CM

## 2021-06-17 DIAGNOSIS — M6281 Muscle weakness (generalized): Secondary | ICD-10-CM

## 2021-06-17 DIAGNOSIS — G8929 Other chronic pain: Secondary | ICD-10-CM

## 2021-06-17 DIAGNOSIS — M79604 Pain in right leg: Secondary | ICD-10-CM

## 2021-06-17 DIAGNOSIS — M545 Low back pain, unspecified: Secondary | ICD-10-CM | POA: Diagnosis not present

## 2021-06-17 NOTE — Therapy (Signed)
[place], [place], [address] Phone: [phone]   Fax:  [phone]  Physical Therapy Treatment/Discharge  Patient Details  Name: Kristi [name] MRN: [id number] Date of Birth: 05-24-1956 Referring Provider (PT): Lauree Chandler, NP   Encounter Date: 06/17/2021   PT End of Session - 06/17/21 1001     Visit Number 9    Number of Visits 17    Date for PT Re-Evaluation 07/15/21    Authorization Type UHC MCR    PT Start Time 1000    PT Stop Time 1032    PT Time Calculation (min) 32 min    Activity Tolerance Patient tolerated treatment well    Behavior During Therapy WFL for tasks assessed/performed             Past Medical History:  Diagnosis Date   Angina at rest [facility])    Arthritis    Asthma    Depression    GERD (gastroesophageal reflux disease)    Hypertension    Migraines    Obesity     Past Surgical History:  Procedure Laterality Date   [place]    There were no vitals filed for this visit.   Subjective Assessment - 06/17/21 1001     Subjective Pt presents to PT s/p fall in home where her chair went out from under her. She notes increases LBP aong with continued L knee pain since fall. She does note that overall she is doing better and feels like she may be good for discharge as she got up from floor with no difficulty, which she was really pleased with. Pt is ready to begin PT treatment at this time.    Currently in Pain? Yes    Pain Score 10-Worst pain ever    Pain Location Knee    Pain Orientation Left           [facility] Adult PT Treatment/Exercise:   Therapeutic Exercise:  NuStep lvl 5 x 5 min UE/LE while taking subjective STS x10 - no UE support LAQ x10 - GTB Standing mini squat - UE support x 10 Seated clam x 15 black tband Standing hip abd x 10 ea GTB   Therapeutic  Activity: Assessment of tests/measures, goals, and outcomes for purposes of discharge     [facility] PT Assessment - 06/17/21 0001       Observation/Other Assessments   Focus on Therapeutic Outcomes (FOTO)  51% function      Sit to Stand   Comments 30 Sec STS: 9 reps                                      PT Short Term Goals - 05/04/21 0858       PT SHORT TERM GOAL #1   Title Pt will be compliant with initial HEP for improved carryover between session    Baseline initial HEP given    Time 3    Period Weeks    Status Achieved    Target Date 04/27/21               PT Long Term Goals - 06/17/21 1009       PT LONG TERM GOAL #1   Title Pt will improve FOTO score to no less than 49%  as proxy for functional improvement    Baseline 40% function; 39% function on 10/31; 41% function - 51% on 12/14    Time 8    Period Weeks    Status Achieved    Target Date 07/15/21      PT LONG TERM GOAL #2   Title Pt will increase 30 Sec STS to no less than 8 reps for improved comfort and functional mobility    Baseline 3 reps; 6 reps on 11/16 - increased goal to 8 reps; 9 reps on 12/14    Time 8    Period Weeks    Status Achieved    Target Date 07/15/21      PT LONG TERM GOAL #3   Title Pt will decrease TUG to no greater than 12 seconds for improved balance and mobility    Baseline 17 seconds; 12 seconds on    Time 8    Period Weeks    Status Achieved      PT LONG TERM GOAL #4   Title Pt will self report generalized pain no greater than 3/10 at worst for improved comfort and function    Baseline 10/10 at worst in multiple joints; 8/10 at worst on 11/16    Time 8    Period Weeks    Status Partially Met    Target Date 07/15/21      PT LONG TERM GOAL #5   Title Pt will be able to perform floor>standing transfer mod I with no increase in pain    Baseline unable    Time 8    Period Weeks    Status Achieved    Target Date 07/15/21                    Plan - 06/17/21 1035     Clinical Impression Statement Pt was able to complete all prescribed exercises and demonstrated knowledge of HEP with no adverse effect. Over the course of PT treatment, pt has progressed very well, showing improved confidence with met FOTO score and increased proximal hip strength/functional mobility with increase in 30"STS reps. While her knee and LBP remain variable, she notes that she feels good about current managment strategies and is pleased with current functional ability. Pt was previously able to perform mat<>floor transfers independently and notes that she continued this indpendence at home with decreased pain noted. She should continue to improve with HEP compliance and is being discharged from formal skilled PT at this time.    PT Treatment/Interventions ADLs/Self Care Home Management;Aquatic Therapy;Cryotherapy;Moist Heat;Gait training;Stair training;Functional mobility training;Therapeutic activities;Therapeutic exercise;Balance training;Neuromuscular re-education;Patient/family education;Manual techniques;Passive range of motion;Dry needling;Taping;Vasopneumatic Device    PT Home Exercise Plan Access Code: [email or web address]    Consulted and Agree with Plan of Care Patient             Patient will benefit from skilled therapeutic intervention in order to improve the following deficits and impairments:  Abnormal gait, Cardiopulmonary status limiting activity, Decreased activity tolerance, Decreased balance, Decreased endurance, Decreased mobility, Decreased range of motion, Decreased strength, Difficulty walking, Dizziness, Postural dysfunction, Improper body mechanics, Obesity, Pain  Visit Diagnosis: Chronic low back pain, unspecified back pain laterality, unspecified whether sciatica present  Muscle weakness (generalized)  Other abnormalities of gait and mobility  Pain in left leg  Pain in right leg     Problem List Patient Active Problem  List   Diagnosis Date Noted   Morbid obesity ([place]) 05/05/2021   Dyspnea 05/05/2021   Chest  pain 02/24/2017   Atypical chest pain 05/10/2016   HTN (hypertension) 05/10/2016   Hypertensive emergency 05/10/2016   Anxiety 05/10/2016   Gastroesophageal reflux disease with esophagitis     Ward Chatters, PT 06/17/2021, 10:39 AM  [facility] [address] [place], [place], [address] Phone: [phone]   Fax:  [phone]  Name: Kristi [name] MRN: [id number] Date of Birth: May 18, 1956  PHYSICAL THERAPY DISCHARGE SUMMARY  Visits from Start of Care: 9  Current functional level related to goals / functional outcomes: See goals and objective   Remaining deficits: See goals and objective   Education / Equipment: HEP   Patient agrees to discharge. Patient goals were met. Patient is being discharged due to being pleased with the current functional level.

## 2021-06-22 ENCOUNTER — Ambulatory Visit (INDEPENDENT_AMBULATORY_CARE_PROVIDER_SITE_OTHER): Payer: Medicare Other | Admitting: Nurse Practitioner

## 2021-06-22 ENCOUNTER — Other Ambulatory Visit: Payer: Self-pay

## 2021-06-22 ENCOUNTER — Encounter: Payer: Self-pay | Admitting: Nurse Practitioner

## 2021-06-22 VITALS — BP 126/80 | HR 99 | Temp 97.7°F | Ht 65.0 in | Wt 312.0 lb

## 2021-06-22 DIAGNOSIS — Z23 Encounter for immunization: Secondary | ICD-10-CM | POA: Diagnosis not present

## 2021-06-22 DIAGNOSIS — Z1159 Encounter for screening for other viral diseases: Secondary | ICD-10-CM

## 2021-06-22 DIAGNOSIS — Z1211 Encounter for screening for malignant neoplasm of colon: Secondary | ICD-10-CM

## 2021-06-22 DIAGNOSIS — M159 Polyosteoarthritis, unspecified: Secondary | ICD-10-CM

## 2021-06-22 DIAGNOSIS — Z1212 Encounter for screening for malignant neoplasm of rectum: Secondary | ICD-10-CM

## 2021-06-22 DIAGNOSIS — E782 Mixed hyperlipidemia: Secondary | ICD-10-CM | POA: Diagnosis not present

## 2021-06-22 DIAGNOSIS — G43109 Migraine with aura, not intractable, without status migrainosus: Secondary | ICD-10-CM

## 2021-06-22 MED ORDER — DICLOFENAC SODIUM 75 MG PO TBEC: 75.0000 mg | DELAYED_RELEASE_TABLET | Freq: Two times a day (BID) | ORAL | 1 refills | Status: DC

## 2021-06-22 MED ORDER — SUMATRIPTAN SUCCINATE 25 MG PO TABS: ORAL_TABLET | ORAL | 0 refills | Status: DC

## 2021-06-22 NOTE — Progress Notes (Signed)
Careteam: Patient Care Team: Lauree Chandler, NP as PCP - General (Geriatric Medicine)  PLACE OF SERVICE:  [place] Directive information Does Patient Have a Medical Advance Directive?: Yes, Type of Advance Directive: [place], Does patient want to make changes to medical advance directive?: No - Patient declined  Allergies  Allergen Reactions   Fish Allergy Anaphylaxis   Iodine Anaphylaxis   Sulfur Hives   Elemental Sulfur Hives    Chief Complaint  Patient presents with   Medical Management of Chronic Issues    3 month follow-up. Discuss need for PCV, hep c screening, colonoscopy, dexa, and covid booster or postpone/exclude if patient refuses. Refill diclofenac and migraine medication. Discuss tubing for nebulizer. Stopped trelegy, patient states she does not feel she needs this medication.      HPI: Patient is a 65 y.o. female for routine followup.  Since last visit she has seen cardiology and had CTA, echo.  Recommended to take ASA 81 mg, low fat diet, weight loss, and proper control of cholesterol.   Asthma- Reports there has been no issues with breathing. No shortness of breath, cough or congestion. Use albuterol PRN.   Migraines- less frequent. Last time she had one she stayed in bed all day because she was out of the imitrex.   Hyperlipidemia- dietary modifications and continues on lipitor.   GERD/HH- stable on protonix.   OA-knees, feet, mid-back, rib pain, hands- on voltaren 75 mg   Review of Systems:  Review of Systems  Constitutional:  Negative for chills, fever and weight loss.  HENT:  Negative for tinnitus.   Respiratory:  Negative for cough, sputum production and shortness of breath.   Cardiovascular:  Negative for chest pain, palpitations and leg swelling.  Gastrointestinal:  Negative for abdominal pain, constipation, diarrhea and heartburn.  Genitourinary:  Negative for dysuria, frequency and urgency.   Musculoskeletal:  Positive for joint pain and myalgias. Negative for back pain and falls.  Skin: Negative.   Neurological:  Positive for headaches. Negative for dizziness.  Psychiatric/Behavioral:  Negative for depression and memory loss. The patient does not have insomnia.    Past Medical History:  Diagnosis Date   Angina at rest [facility])    Arthritis    Asthma    Depression    GERD (gastroesophageal reflux disease)    Hypertension    Migraines    Obesity    Past Surgical History:  Procedure Laterality Date   ABDOMINAL HYSTERECTOMY     APPENDECTOMY     CHOLECYSTECTOMY     GALLBLADDER SURGERY  1989   Social History:   reports that she quit smoking about 15 years ago. Her smoking use included cigarettes. She smoked an average of 3 packs per day. She has never used smokeless tobacco. She reports that she does not drink alcohol and does not use drugs.  Family History  Problem Relation Age of Onset   Hypertension Mother    Heart disease Mother        3 CABG, stents placements.    Heart attack Mother        late 3s   Cancer Mother    Diabetes Mother    High Cholesterol Mother    Cancer Father    Breast cancer Sister    Breast cancer Sister    Breast cancer Sister    Breast cancer Maternal Aunt    Diabetes Other    Heart disease Other  Medications: Patient's Medications  New Prescriptions   No medications on file  Previous Medications   ACETAMINOPHEN (TYLENOL) 650 MG CR TABLET    Take 1,950 mg by mouth every 6 (six) hours as needed for pain.   ALBUTEROL (PROVENTIL HFA;VENTOLIN HFA) 108 (90 BASE) MCG/ACT INHALER    Inhale 2 puffs into the lungs every 6 (six) hours as needed for wheezing or shortness of breath.   AMLODIPINE (NORVASC) 10 MG TABLET    Take 1 tablet (10 mg total) by mouth daily.   ASPIRIN EC 81 MG TABLET    Take 1 tablet (81 mg total) by mouth daily. Swallow whole.   ATORVASTATIN (LIPITOR) 10 MG TABLET    Take 1 tablet (10 mg total) by mouth daily.    DICLOFENAC (VOLTAREN) 75 MG EC TABLET    Take 75 mg by mouth 2 (two) times daily.   FLUTICASONE-UMECLIDIN-VILANT (TRELEGY ELLIPTA) 100-62.5-25 MCG/INH AEPB    Inhale 1 puff into the lungs in the morning.   MELATONIN 10 MG TABS    Take 1 tablet by mouth as needed.   PANTOPRAZOLE (PROTONIX) 40 MG TABLET    Take 1 tablet (40 mg total) by mouth in the morning.   PRAMIPEXOLE (MIRAPEX) 0.25 MG TABLET    Take 0.25 mg by mouth at bedtime.   SODIUM CHLORIDE (OCEAN) 0.65 % SOLN NASAL SPRAY    Place 1 spray into both nostrils as needed for congestion.   SUMATRIPTAN (IMITREX) 25 MG TABLET    May repeat in 2 hours if headache persists or recurs. MAX 3 tablets/24 hours   VENLAFAXINE XR (EFFEXOR-XR) 37.5 MG 24 HR CAPSULE    Take 1 capsule (37.5 mg total) by mouth daily with breakfast. DX F41.9, F33.9, G43.109   VITAMIN D, ERGOCALCIFEROL, (DRISDOL) 1.25 MG (50000 UNIT) CAPS CAPSULE    Take 50,000 Units by mouth once a week.  Modified Medications   No medications on file  Discontinued Medications   ALBUTEROL IN    Inhale 4 puffs into the lungs 4 (four) times daily as needed.   BENZONATATE (TESSALON) 100 MG CAPSULE    Take 1 capsule (100 mg total) by mouth 3 (three) times daily as needed.   DIPHENHYDRAMINE (BENADRYL) 50 MG CAPSULE    Take one capsule 1 hour prior to scan.   FLUTICASONE (FLONASE) 50 MCG/ACT NASAL SPRAY    Place 2 sprays into both nostrils daily.   METOPROLOL TARTRATE (LOPRESSOR) 100 MG TABLET    Take 1 tablet (100 mg total) by mouth once for 1 dose. Take 90-120 minutes prior to scan.   PREDNISONE (DELTASONE) 50 MG TABLET    Take one tablet 13 hours, 7 hours, and 1 hour prior to scan.    Physical Exam:  Vitals:   06/22/21 1123  BP: 126/80  Pulse: 99  Temp: 97.7 F (36.5 C)  TempSrc: Temporal  SpO2: 97%  Weight: (!) 312 lb (141.5 kg)  Height: '5\' 5"'  (1.651 m)   Body mass index is 51.92 kg/m. Wt Readings from Last 3 Encounters:  06/22/21 (!) 312 lb (141.5 kg)  05/05/21 (!) 318 lb  (144.2 kg)  03/16/21 (!) 325 lb (147.4 kg)    Physical Exam Constitutional:      General: She is not in acute distress.    Appearance: She is well-developed. She is not diaphoretic.  HENT:     Head: Normocephalic and atraumatic.     Mouth/Throat:     Pharynx: No oropharyngeal exudate.  Eyes:  Conjunctiva/sclera: Conjunctivae normal.     Pupils: Pupils are equal, round, and reactive to light.  Cardiovascular:     Rate and Rhythm: Normal rate and regular rhythm.     Heart sounds: Normal heart sounds.  Pulmonary:     Effort: Pulmonary effort is normal.     Breath sounds: Normal breath sounds.  Abdominal:     General: Bowel sounds are normal.     Palpations: Abdomen is soft.  Musculoskeletal:     Cervical back: Normal range of motion and neck supple.     Right lower leg: No edema.     Left lower leg: No edema.  Skin:    General: Skin is warm and dry.  Neurological:     Mental Status: She is alert and oriented to person, place, and time.  Psychiatric:        Mood and Affect: Mood normal.    Labs reviewed: Basic Metabolic Panel: Recent Labs    03/16/21 1209 05/05/21 0000 06/09/21 0803  NA 141 141  --   K 4.6 4.5  --   CL 103 100  --   CO2 30 25  --   GLUCOSE 83 78  --   BUN 13 11  --   CREATININE 0.78 0.85 0.80  CALCIUM 9.0 8.9  --    Liver Function Tests: Recent Labs    03/16/21 1209  AST 15  ALT 12  BILITOT 0.5  PROT 6.9   No results for input(s): LIPASE, AMYLASE in the last 8760 hours. No results for input(s): AMMONIA in the last 8760 hours. CBC: Recent Labs    03/16/21 1209  WBC 6.3  NEUTROABS 3,830  HGB 13.2  HCT 40.3  MCV 81.6  PLT 341   Lipid Panel: Recent Labs    03/16/21 1209  CHOL 243*  HDL 57  LDLCALC 163*  TRIG 114  CHOLHDL 4.3   TSH: No results for input(s): TSH in the last 8760 hours. A1C: Lab Results  Component Value Date   HGBA1C 5.5 03/09/2017     Assessment/Plan 1. Need for hepatitis C screening test -  Hepatitis C antibody  2. Mixed hyperlipidemia -started statin at last visit. Will follow up.  - Lipid panel - CMP with eGFR([facility])  3. Encounter for colorectal cancer screening - Ambulatory referral to Gastroenterology  4. Migraine with aura and without status migrainosus, not intractable -stable - SUMAtriptan (IMITREX) 25 MG tablet; May repeat in 2 hours if headache persists or recurs. MAX 3 tablets/24 hours  Dispense: 10 tablet; Refill: 0  5. Primary osteoarthritis involving multiple joints -stable on current regimen. Discussed adverse effects of NSAID long term.  - diclofenac (VOLTAREN) 75 MG EC tablet; Take 1 tablet (75 mg total) by mouth 2 (two) times daily.  Dispense: 60 tablet; Refill: 1  6. Need for pneumococcal vaccination - Pneumococcal conjugate vaccine 20-valent (Prevnar 20)  7. Morbid obesity -has lost weight and plans to continue to do so, reports her long term goal is 200 lbs, education provided on healthy weight loss through increase in physical activity and proper nutrition   Next appt: 3 months.  Carlos American. [place], [facility] [phone]

## 2021-06-22 NOTE — Patient Instructions (Signed)
Keep up the good work with exercising and eating healthy.

## 2021-06-23 LAB — COMPLETE METABOLIC PANEL WITH GFR
AG Ratio: 1.3 (calc) (ref 1.0–2.5)
ALT: 11 U/L (ref 6–29)
AST: 16 U/L (ref 10–35)
Albumin: 3.7 g/dL (ref 3.6–5.1)
Alkaline phosphatase (APISO): 63 U/L (ref 37–153)
BUN: 10 mg/dL (ref 7–25)
CO2: 27 mmol/L (ref 20–32)
Calcium: 8.6 mg/dL (ref 8.6–10.4)
Chloride: 104 mmol/L (ref 98–110)
Creat: 0.8 mg/dL (ref 0.50–1.05)
Globulin: 2.9 g/dL (calc) (ref 1.9–3.7)
Glucose, Bld: 73 mg/dL (ref 65–139)
Potassium: 3.9 mmol/L (ref 3.5–5.3)
Sodium: 142 mmol/L (ref 135–146)
Total Bilirubin: 0.5 mg/dL (ref 0.2–1.2)
Total Protein: 6.6 g/dL (ref 6.1–8.1)
eGFR: 81 mL/min/{1.73_m2} (ref >=60)

## 2021-06-23 LAB — LIPID PANEL
Cholesterol: 176 mg/dL (ref <200)
HDL: 46 mg/dL — ABNORMAL LOW (ref >=50)
LDL Cholesterol (Calc): 105 mg/dL (calc) — ABNORMAL HIGH
Non-HDL Cholesterol (Calc): 130 mg/dL (calc) — ABNORMAL HIGH (ref <130)
Total CHOL/HDL Ratio: 3.8 (calc) (ref <5.0)
Triglycerides: 141 mg/dL (ref <150)

## 2021-06-23 LAB — HEPATITIS C ANTIBODY
Hepatitis C Ab: NONREACTIVE
SIGNAL TO CUT-OFF: 0.6 (ref <1.00)

## 2021-06-24 ENCOUNTER — Telehealth: Payer: Self-pay | Admitting: *Deleted

## 2021-06-24 DIAGNOSIS — G43109 Migraine with aura, not intractable, without status migrainosus: Secondary | ICD-10-CM

## 2021-06-24 MED ORDER — SUMATRIPTAN SUCCINATE 25 MG PO TABS: ORAL_TABLET | ORAL | 3 refills | Status: DC

## 2021-06-24 NOTE — Telephone Encounter (Signed)
Lauren with [facility] called and stated that they need to Clarify a Rx that was sent in 12/19 for Sumatriptan. Stated that the Directions are: Nayely Dingus repeat in 2 hours if Headache persist or recurs.   They stated that they need a Starting Dose and needs a new Rx faxed to them.   Please Advise. (Forwarded to [employer] due to [place] out of office)

## 2021-06-24 NOTE — Telephone Encounter (Signed)
Rx updated and sent to Pharmacy

## 2021-06-24 NOTE — Telephone Encounter (Signed)
Sumatriptan 25 mg tablet x I dose may repeat x 1 dose after 2 hours maximum 100 mg per 24 hours for migraine headache. # 10 with 3 refills.

## 2021-06-30 ENCOUNTER — Other Ambulatory Visit: Payer: Self-pay | Admitting: *Deleted

## 2021-06-30 ENCOUNTER — Encounter: Payer: Self-pay | Admitting: Internal Medicine

## 2021-06-30 DIAGNOSIS — G43109 Migraine with aura, not intractable, without status migrainosus: Secondary | ICD-10-CM

## 2021-06-30 MED ORDER — SUMATRIPTAN SUCCINATE 25 MG PO TABS: ORAL_TABLET | ORAL | 3 refills | Status: DC

## 2021-06-30 NOTE — Telephone Encounter (Signed)
Kristi [name] with [facility] called and stated that they never received the Rx for patient's Sumatriptan.   Refaxed Rx.

## 2021-07-08 ENCOUNTER — Encounter (INDEPENDENT_AMBULATORY_CARE_PROVIDER_SITE_OTHER): Payer: Self-pay

## 2021-07-23 ENCOUNTER — Ambulatory Visit: Payer: Medicare Other | Admitting: Internal Medicine

## 2021-07-24 ENCOUNTER — Telehealth: Payer: Medicare Other | Admitting: Physician Assistant

## 2021-07-24 DIAGNOSIS — J040 Acute laryngitis: Secondary | ICD-10-CM

## 2021-07-24 MED ORDER — PREDNISONE 20 MG PO TABS: 20.0000 mg | ORAL_TABLET | Freq: Every day | ORAL | 0 refills | Status: DC

## 2021-07-24 NOTE — Patient Instructions (Signed)
Sheretta Osmun, thank you for joining Mar Daring, PA-C for today's virtual visit.  While this provider is not your primary care provider (PCP), if your PCP is located in our provider database this encounter information will be shared with them immediately following your visit.  Consent: (Patient) Kristi [name] provided verbal consent for this virtual visit at the beginning of the encounter.  Current Medications:  Current Outpatient Medications:    predniSONE (DELTASONE) 20 MG tablet, Take 1 tablet (20 mg total) by mouth daily with breakfast., Disp: 10 tablet, Rfl: 0   acetaminophen (TYLENOL) 650 MG CR tablet, Take 1,950 mg by mouth every 6 (six) hours as needed for pain., Disp: , Rfl:    albuterol (PROVENTIL HFA;VENTOLIN HFA) 108 (90 Base) MCG/ACT inhaler, Inhale 2 puffs into the lungs every 6 (six) hours as needed for wheezing or shortness of breath., Disp: 6.7 g, Rfl: 2   amLODipine (NORVASC) 10 MG tablet, Take 1 tablet (10 mg total) by mouth daily., Disp: 30 tablet, Rfl: 5   aspirin EC 81 MG tablet, Take 1 tablet (81 mg total) by mouth daily. Swallow whole., Disp: 90 tablet, Rfl: 3   atorvastatin (LIPITOR) 10 MG tablet, Take 1 tablet (10 mg total) by mouth daily., Disp: 30 tablet, Rfl: 5   diclofenac (VOLTAREN) 75 MG EC tablet, Take 1 tablet (75 mg total) by mouth 2 (two) times daily., Disp: 60 tablet, Rfl: 1   Melatonin 10 MG TABS, Take 1 tablet by mouth as needed., Disp: , Rfl:    pantoprazole (PROTONIX) 40 MG tablet, Take 1 tablet (40 mg total) by mouth in the morning., Disp: 30 tablet, Rfl: 5   pramipexole (MIRAPEX) 0.25 MG tablet, Take 0.25 mg by mouth at bedtime., Disp: , Rfl:    sodium chloride (OCEAN) 0.65 % SOLN nasal spray, Place 1 spray into both nostrils as needed for congestion., Disp: , Rfl:    SUMAtriptan (IMITREX) 25 MG tablet, Take one tablet by mouth as needed for Headache. May repeat in 2 hours if headache persists or recurs. MAX 3 tablets/24 hours, Disp: 10  tablet, Rfl: 3   venlafaxine XR (EFFEXOR-XR) 37.5 MG 24 hr capsule, Take 1 capsule (37.5 mg total) by mouth daily with breakfast. DX F41.9, F33.9, G43.109, Disp: 30 capsule, Rfl: 5   Vitamin D, Ergocalciferol, (DRISDOL) 1.25 MG (50000 UNIT) CAPS capsule, Take 50,000 Units by mouth once a week., Disp: , Rfl:    Medications ordered in this encounter:  Meds ordered this encounter  Medications   predniSONE (DELTASONE) 20 MG tablet    Sig: Take 1 tablet (20 mg total) by mouth daily with breakfast.    Dispense:  10 tablet    Refill:  0    Order Specific Question:   Supervising Provider    Answer:   Noemi Chapel [[id number]]     *If you need refills on other medications prior to your next appointment, please contact your pharmacy*  Follow-Up: Call back or seek an in-person evaluation if the symptoms worsen or if the condition fails to improve as anticipated.  Other Instructions Laryngitis Laryngitis is inflammation of the vocal cords that causes symptoms such as hoarseness or loss of voice. The vocal cords are two bands of muscles in your throat. When you speak, these cords come together and vibrate. The vibrations come out through your mouth as sound. When your vocal cords are inflamed, your voice sounds different. Laryngitis can be temporary (acute) or long-term (chronic). Most cases of acute laryngitis improve  with time. Chronic laryngitis is laryngitis that lasts for more than 3 weeks. What are the causes? Acute laryngitis may be caused by: A viral infection. Lots of talking, yelling, or singing. This is also called vocal strain. A bacterial infection. Chronic laryngitis may be caused by: Vocal strain or an injury to the vocal cords. Acid reflux (gastroesophageal reflux disease, or GERD). Allergies, a sinus infection, or postnasal drip. Smoking. Excessive alcohol use. Breathing in chemicals or dust. Growths on the vocal cords. What increases the risk? The following factors may make you  more likely to develop this condition: Smoking. Alcohol abuse. Having allergies. Chronic irritants in the workplace, such as toxic fumes. What are the signs or symptoms? Symptoms of this condition may include: Low, hoarse voice. Loss of voice. Dry cough. Sore or dry throat. Stuffy or congested nose. How is this diagnosed? This condition may be diagnosed based on: Your symptoms and a physical exam. Throat culture. Blood test. A procedure in which your health care provider looks at your vocal cords with a mirror or viewing tube (laryngoscopy). How is this treated? Treatment for laryngitis depends on what is causing it. Usually, treatment involves resting your voice and using medicines to soothe your throat. If your laryngitis is caused by a bacterial infection, you may need to take antibiotic medicine. If your laryngitis is caused by a growth, you may need to have a procedure to remove it. Follow these instructions at home: Medicines Take over-the-counter and prescription medicines only as told by your health care provider. If you were prescribed an antibiotic medicine, take it as told by your health care provider. Do not stop taking the antibiotic even if you start to feel better. Use throat lozenges or sprays to soothe your throat as told by your health care provider. General instructions  Talk as little as possible. To do this: Write instead of talking. Do this until your voice is back to normal. Avoid whispering, which can cause vocal strain. Gargle with a mixture of salt and water 3-4 times a day or as needed. To make salt water, completely dissolve -1 tsp (3-6 g) of salt in 1 cup (237 mL) of warm water. Drink enough fluid to keep your urine pale yellow. Breathe in moist air. Use a humidifier if you live in a dry climate. Do not use any products that contain nicotine or tobacco. These products include cigarettes, chewing tobacco, and vaping devices, such as e-cigarettes. If you  need help quitting, ask your health care provider. Contact a health care provider if: You have a fever. You have increasing pain. Your symptoms do not get better in 2 weeks. Get help right away if: You cough up blood. You have difficulty swallowing. You have trouble breathing. Summary Laryngitis is inflammation of the vocal cords that causes symptoms such as hoarseness or loss of voice. Laryngitis can be temporary or long-term. Treatment for laryngitis depends on the cause. It often involves resting your voice and using medicine to soothe your throat. Get help right away if you have difficulty swallowing or breathing or if you cough up blood. This information is not intended to replace advice given to you by your health care provider. Make sure you discuss any questions you have with your health care provider. Document Revised: 09/08/2020 Document Reviewed: 09/08/2020 Elsevier Patient Education  2022 [employer].    If you have been instructed to have an in-person evaluation today at a local Urgent Care facility, please use the link below. It will  take you to a list of all of our available [facility], including address, phone number and hours of operation. Please do not delay care.  [facility]  If you or a family member do not have a primary care provider, use the link below to schedule a visit and establish care. When you choose a [facility] [place] primary care physician or advanced practice provider, you gain a long-term partner in health. Find a Primary Care Provider  Learn more about [employer]'s in-office and virtual care options: [facility]

## 2021-07-24 NOTE — Progress Notes (Signed)
Virtual Visit Consent   [place], you are scheduled for a virtual visit with a [facility] provider today.     Just as with appointments in the office, your consent must be obtained to participate.  Your consent will be active for this visit and any virtual visit you may have with one of our providers in the next 365 days.     If you have a MyChart account, a copy of this consent can be sent to you electronically.  All virtual visits are billed to your insurance company just like a traditional visit in the office.    As this is a virtual visit, video technology does not allow for your provider to perform a traditional examination.  This may limit your provider's ability to fully assess your condition.  If your provider identifies any concerns that need to be evaluated in person or the need to arrange testing (such as labs, EKG, etc.), we will make arrangements to do so.     Although advances in technology are sophisticated, we cannot ensure that it will always work on either your end or our end.  If the connection with a video visit is poor, the visit may have to be switched to a telephone visit.  With either a video or telephone visit, we are not always able to ensure that we have a secure connection.     I need to obtain your verbal consent now.   Are you willing to proceed with your visit today?    Kristi [name] has provided verbal consent on 07/24/2021 for a virtual visit (video or telephone).   Mar Daring, PA-C   Date: 07/24/2021 9:51 AM   Virtual Visit via Video Note   I, Mar Daring, connected with  Kristi [name]  ([id number], 05-Jul-1956) on 07/24/21 at  9:45 AM EST by a video-enabled telemedicine application and verified that I am speaking with the correct person using two identifiers.  Location: Patient: Virtual Visit Location Patient: Home Provider: Virtual Visit Location Provider: Home Office   I discussed the limitations of evaluation and management  by telemedicine and the availability of in person appointments. The patient expressed understanding and agreed to proceed.    History of Present Illness: Kristi [name] is a 66 y.o. who identifies as a female who was assigned female at birth, and is being seen today for possible laryngitis.  HPI: URI  This is a new problem. The current episode started yesterday. The problem has been gradually worsening. There has been no fever. Associated symptoms include ear pain (little to the right side), headaches (mild), rhinorrhea (mild) and a sore throat (not sore just scratchy). Pertinent negatives include no congestion, coughing, diarrhea, nausea, plugged ear sensation, sinus pain, swollen glands or vomiting. Associated symptoms comments: Hoarse voice. Treatments tried: tylenol, salt water gargles. The treatment provided mild relief.     Problems:  Patient Active Problem List   Diagnosis Date Noted   Morbid obesity ([place]) 05/05/2021   Dyspnea 05/05/2021   Chest pain 02/24/2017   Atypical chest pain 05/10/2016   HTN (hypertension) 05/10/2016   Hypertensive emergency 05/10/2016   Anxiety 05/10/2016   Gastroesophageal reflux disease with esophagitis     Allergies:  Allergies  Allergen Reactions   Fish Allergy Anaphylaxis   Iodine Anaphylaxis   Sulfur Hives   Elemental Sulfur Hives   Medications:  Current Outpatient Medications:    predniSONE (DELTASONE) 20 MG tablet, Take 1 tablet (20 mg total) by mouth daily with  breakfast., Disp: 10 tablet, Rfl: 0   acetaminophen (TYLENOL) 650 MG CR tablet, Take 1,950 mg by mouth every 6 (six) hours as needed for pain., Disp: , Rfl:    albuterol (PROVENTIL HFA;VENTOLIN HFA) 108 (90 Base) MCG/ACT inhaler, Inhale 2 puffs into the lungs every 6 (six) hours as needed for wheezing or shortness of breath., Disp: 6.7 g, Rfl: 2   amLODipine (NORVASC) 10 MG tablet, Take 1 tablet (10 mg total) by mouth daily., Disp: 30 tablet, Rfl: 5   aspirin EC 81 MG tablet, Take 1  tablet (81 mg total) by mouth daily. Swallow whole., Disp: 90 tablet, Rfl: 3   atorvastatin (LIPITOR) 10 MG tablet, Take 1 tablet (10 mg total) by mouth daily., Disp: 30 tablet, Rfl: 5   diclofenac (VOLTAREN) 75 MG EC tablet, Take 1 tablet (75 mg total) by mouth 2 (two) times daily., Disp: 60 tablet, Rfl: 1   Melatonin 10 MG TABS, Take 1 tablet by mouth as needed., Disp: , Rfl:    pantoprazole (PROTONIX) 40 MG tablet, Take 1 tablet (40 mg total) by mouth in the morning., Disp: 30 tablet, Rfl: 5   pramipexole (MIRAPEX) 0.25 MG tablet, Take 0.25 mg by mouth at bedtime., Disp: , Rfl:    sodium chloride (OCEAN) 0.65 % SOLN nasal spray, Place 1 spray into both nostrils as needed for congestion., Disp: , Rfl:    SUMAtriptan (IMITREX) 25 MG tablet, Take one tablet by mouth as needed for Headache. May repeat in 2 hours if headache persists or recurs. MAX 3 tablets/24 hours, Disp: 10 tablet, Rfl: 3   venlafaxine XR (EFFEXOR-XR) 37.5 MG 24 hr capsule, Take 1 capsule (37.5 mg total) by mouth daily with breakfast. DX F41.9, F33.9, G43.109, Disp: 30 capsule, Rfl: 5   Vitamin D, Ergocalciferol, (DRISDOL) 1.25 MG (50000 UNIT) CAPS capsule, Take 50,000 Units by mouth once a week., Disp: , Rfl:   Observations/Objective: Patient is well-developed, well-nourished in no acute distress.  Resting comfortably at home.  Head is normocephalic, atraumatic.  No labored breathing.  Speech is clear and coherent with logical content.  Patient is alert and oriented at baseline.  Hoarse, strained voice to talk  Assessment and Plan: 1. Laryngitis - predniSONE (DELTASONE) 20 MG tablet; Take 1 tablet (20 mg total) by mouth daily with breakfast.  Dispense: 10 tablet; Refill: 0  - Suspect Laryngitis, viral - Continue supportive care (salt water gargles, warm liquids, voice rest) - Will add prednisone for inflammation - Seek in person evaluation if not improving or worsening  Follow Up Instructions: I discussed the  assessment and treatment plan with the patient. The patient was provided an opportunity to ask questions and all were answered. The patient agreed with the plan and demonstrated an understanding of the instructions.  A copy of instructions were sent to the patient via MyChart unless otherwise noted below.    The patient was advised to call back or seek an in-person evaluation if the symptoms worsen or if the condition fails to improve as anticipated.  Time:  I spent 11 minutes with the patient via telehealth technology discussing the above problems/concerns.    Mar Daring, PA-C

## 2021-08-01 ENCOUNTER — Other Ambulatory Visit: Payer: Self-pay | Admitting: Nurse Practitioner

## 2021-08-01 DIAGNOSIS — M159 Polyosteoarthritis, unspecified: Secondary | ICD-10-CM

## 2021-08-03 ENCOUNTER — Other Ambulatory Visit: Payer: Self-pay | Admitting: *Deleted

## 2021-08-03 DIAGNOSIS — M159 Polyosteoarthritis, unspecified: Secondary | ICD-10-CM

## 2021-08-03 MED ORDER — DICLOFENAC SODIUM 75 MG PO TBEC: 75.0000 mg | DELAYED_RELEASE_TABLET | Freq: Two times a day (BID) | ORAL | 1 refills | Status: DC

## 2021-08-03 NOTE — Telephone Encounter (Signed)
[facility] requested refill.  

## 2021-08-04 ENCOUNTER — Telehealth (INDEPENDENT_AMBULATORY_CARE_PROVIDER_SITE_OTHER): Payer: Medicare Other | Admitting: Nurse Practitioner

## 2021-08-04 ENCOUNTER — Telehealth: Payer: Self-pay

## 2021-08-04 ENCOUNTER — Other Ambulatory Visit: Payer: Self-pay

## 2021-08-04 DIAGNOSIS — G43109 Migraine with aura, not intractable, without status migrainosus: Secondary | ICD-10-CM | POA: Diagnosis not present

## 2021-08-04 NOTE — Progress Notes (Signed)
This service is provided via telemedicine  No vital signs collected/recorded due to the encounter was a telemedicine visit.   Location of patient (ex: home, work):  Home  Patient consents to a telephone visit:  Yes, see encounter dated 08/04/2021  Location of the provider (ex: office, home):  [place]  Name of any referring provider:  N/A  Names of all persons participating in the telemedicine service and their role in the encounter:  Sherrie Mustache, Nurse Practitioner, Carroll Kinds, CMA, and patient.   Time spent on call:  9 minutes with medical assistant

## 2021-08-04 NOTE — Telephone Encounter (Signed)
Ms. Kristi [name], Kristi [name] are scheduled for a virtual visit with your provider today.    Just as we do with appointments in the office, we must obtain your consent to participate.  Your consent will be active for this visit and any virtual visit you may have with one of our providers in the next 365 days.    If you have a MyChart account, I can also send a copy of this consent to you electronically.  All virtual visits are billed to your insurance company just like a traditional visit in the office.  As this is a virtual visit, video technology does not allow for your provider to perform a traditional examination.  This may limit your provider's ability to fully assess your condition.  If your provider identifies any concerns that need to be evaluated in person or the need to arrange testing such as labs, EKG, etc, we will make arrangements to do so.    Although advances in technology are sophisticated, we cannot ensure that it will always work on either your end or our end.  If the connection with a video visit is poor, we may have to switch to a telephone visit.  With either a video or telephone visit, we are not always able to ensure that we have a secure connection.   I need to obtain your verbal consent now.   Are you willing to proceed with your visit today?   Kristi [name] has provided verbal consent on 08/04/2021 for a virtual visit (video or telephone).   Carroll Kinds, CMA 08/04/2021  2:52 PM

## 2021-08-04 NOTE — Progress Notes (Signed)
Careteam: Patient Care Team: Lauree Chandler, NP as PCP - General (Geriatric Medicine)  Advanced Directive information    Allergies  Allergen Reactions   Fish Allergy Anaphylaxis   Iodine Anaphylaxis   Sulfur Hives   Elemental Sulfur Hives    Chief Complaint  Patient presents with   Acute Visit    Patient complains of migraine.Patient has been having migraine since 2 am. Patient took 2 of the sumatriptan, but not helping. Pain level went from a 10 to a 9 after taking medication.     HPI: Patient is a 66 y.o. female due to migraine since 2 am.  Reports she woke up with headache at 2 am with severe headache. Felt like she got hit. She is very nauseous.  She took her sumatriptan at 2 am and then repeated dose at 4 am.  Computer light making her head hurt and nauseous.  Generally 2 sumatriptan tablets will help abort symptoms. Unsure what else she could take.  No vomiting.    Review of Systems:  Review of Systems  Constitutional:  Negative for chills and fever.  Gastrointestinal:  Positive for nausea.  Neurological:  Positive for headaches.   Past Medical History:  Diagnosis Date   Angina at rest [facility])    Arthritis    Asthma    Depression    GERD (gastroesophageal reflux disease)    Hypertension    Migraines    Obesity    Past Surgical History:  Procedure Laterality Date   ABDOMINAL HYSTERECTOMY     APPENDECTOMY     CHOLECYSTECTOMY     GALLBLADDER SURGERY  1989   Social History:   reports that she quit smoking about 15 years ago. Her smoking use included cigarettes. She smoked an average of 3 packs per day. She has never used smokeless tobacco. She reports that she does not drink alcohol and does not use drugs.  Family History  Problem Relation Age of Onset   Hypertension Mother    Heart disease Mother        3 CABG, stents placements.    Heart attack Mother        late 45s   Cancer Mother    Diabetes Mother    High Cholesterol Mother    Cancer  Father    Breast cancer Sister    Breast cancer Sister    Breast cancer Sister    Breast cancer Maternal Aunt    Diabetes Other    Heart disease Other     Medications: Patient's Medications  New Prescriptions   No medications on file  Previous Medications   ACETAMINOPHEN (TYLENOL) 650 MG CR TABLET    Take 1,950 mg by mouth every 6 (six) hours as needed for pain.   ALBUTEROL (PROVENTIL HFA;VENTOLIN HFA) 108 (90 BASE) MCG/ACT INHALER    Inhale 2 puffs into the lungs every 6 (six) hours as needed for wheezing or shortness of breath.   AMLODIPINE (NORVASC) 10 MG TABLET    Take 1 tablet (10 mg total) by mouth daily.   ASPIRIN EC 81 MG TABLET    Take 1 tablet (81 mg total) by mouth daily. Swallow whole.   ATORVASTATIN (LIPITOR) 10 MG TABLET    Take 1 tablet (10 mg total) by mouth daily.   DICLOFENAC (VOLTAREN) 75 MG EC TABLET    Take 1 tablet (75 mg total) by mouth 2 (two) times daily.   MELATONIN 10 MG TABS    Take 1 tablet  by mouth as needed.   PANTOPRAZOLE (PROTONIX) 40 MG TABLET    Take 1 tablet (40 mg total) by mouth in the morning.   PRAMIPEXOLE (MIRAPEX) 0.25 MG TABLET    Take 0.25 mg by mouth at bedtime.   SODIUM CHLORIDE (OCEAN) 0.65 % SOLN NASAL SPRAY    Place 1 spray into both nostrils as needed for congestion.   SUMATRIPTAN (IMITREX) 25 MG TABLET    Take one tablet by mouth as needed for Headache. May repeat in 2 hours if headache persists or recurs. MAX 3 tablets/24 hours   VENLAFAXINE XR (EFFEXOR-XR) 37.5 MG 24 HR CAPSULE    Take 1 capsule (37.5 mg total) by mouth daily with breakfast. DX F41.9, F33.9, G43.109   VITAMIN D, ERGOCALCIFEROL, (DRISDOL) 1.25 MG (50000 UNIT) CAPS CAPSULE    Take 50,000 Units by mouth once a week.  Modified Medications   No medications on file  Discontinued Medications   PREDNISONE (DELTASONE) 20 MG TABLET    Take 1 tablet (20 mg total) by mouth daily with breakfast.    Physical Exam:  There were no vitals filed for this visit. There is no height  or weight on file to calculate BMI. Wt Readings from Last 3 Encounters:  06/22/21 (!) 312 lb (141.5 kg)  05/05/21 (!) 318 lb (144.2 kg)  03/16/21 (!) 325 lb (147.4 kg)    Physical Exam Neurological:     Mental Status: She is alert and oriented to person, place, and time. Mental status is at baseline.  Psychiatric:        Mood and Affect: Mood normal.    Labs reviewed: Basic Metabolic Panel: Recent Labs    03/16/21 1209 05/05/21 0000 06/09/21 0803 06/22/21 1403  NA 141 141  --  142  K 4.6 4.5  --  3.9  CL 103 100  --  104  CO2 30 25  --  27  GLUCOSE 83 78  --  73  BUN 13 11  --  10  CREATININE 0.78 0.85 0.80 0.80  CALCIUM 9.0 8.9  --  8.6   Liver Function Tests: Recent Labs    03/16/21 1209 06/22/21 1403  AST 15 16  ALT 12 11  BILITOT 0.5 0.5  PROT 6.9 6.6   No results for input(s): LIPASE, AMYLASE in the last 8760 hours. No results for input(s): AMMONIA in the last 8760 hours. CBC: Recent Labs    03/16/21 1209  WBC 6.3  NEUTROABS 3,830  HGB 13.2  HCT 40.3  MCV 81.6  PLT 341   Lipid Panel: Recent Labs    03/16/21 1209 06/22/21 1403  CHOL 243* 176  HDL 57 46*  LDLCALC 163* 105*  TRIG 114 141  CHOLHDL 4.3 3.8   TSH: No results for input(s): TSH in the last 8760 hours. A1C: Lab Results  Component Value Date   HGBA1C 5.5 03/09/2017     Assessment/Plan 1. Migraine with aura and without status migrainosus, not intractable -okay to repeat sumatriptan x1 today/max of 24 hours  -rest and avoidance of light and loud noise.  -naproxen 220 mg x 1(to not take with other NSAIDs/Voltaren)  -maintain proper hydration  -follow up precautions discussed  Korry Dalgleish K. Harle Battiest  [facility] [phone]    Virtual Visit via video  I connected with patient on 08/04/21 at  2:45 PM EST by mychart video and verified that I am speaking with the correct person using two identifiers.  Location: Patient:  home Provider: [place]   I discussed the limitations, risks, security and privacy concerns of performing an evaluation and management service by telephone and the availability of in person appointments. I also discussed with the patient that there may be a patient responsible charge related to this service. The patient expressed understanding and agreed to proceed.   I discussed the assessment and treatment plan with the patient. The patient was provided an opportunity to ask questions and all were answered. The patient agreed with the plan and demonstrated an understanding of the instructions.   The patient was advised to call back or seek an in-person evaluation if the symptoms worsen or if the condition fails to improve as anticipated.  I provided 15 minutes of non-face-to-face time during this encounter.  Carlos American. Harle Battiest Avs printed and mailed

## 2021-08-18 ENCOUNTER — Ambulatory Visit (INDEPENDENT_AMBULATORY_CARE_PROVIDER_SITE_OTHER): Payer: Medicare Other | Admitting: Internal Medicine

## 2021-08-18 ENCOUNTER — Encounter: Payer: Self-pay | Admitting: Internal Medicine

## 2021-08-18 VITALS — BP 130/82 | HR 74 | Ht 65.0 in | Wt 311.2 lb

## 2021-08-18 DIAGNOSIS — R198 Other specified symptoms and signs involving the digestive system and abdomen: Secondary | ICD-10-CM | POA: Diagnosis not present

## 2021-08-18 DIAGNOSIS — Z1211 Encounter for screening for malignant neoplasm of colon: Secondary | ICD-10-CM

## 2021-08-18 DIAGNOSIS — R109 Unspecified abdominal pain: Secondary | ICD-10-CM | POA: Diagnosis not present

## 2021-08-18 DIAGNOSIS — Z8619 Personal history of other infectious and parasitic diseases: Secondary | ICD-10-CM | POA: Diagnosis not present

## 2021-08-18 NOTE — Patient Instructions (Signed)
If you are age 67 or older, your body mass index should be between 23-30. Your Body mass index is 51.79 kg/m. If this is out of the aforementioned range listed, please consider follow up with your Primary Care Provider.  If you are age 96 or younger, your body mass index should be between 19-25. Your Body mass index is 51.79 kg/m. If this is out of the aformentioned range listed, please consider follow up with your Primary Care Provider.   Start daily Fiber Supplement and follow low fodmap diet.  You have been scheduled for an endoscopy and colonoscopy. Please follow the written instructions given to you at  your visit today. Please pick up your prep supplies at the pharmacy within the next 1-3 days. If you use inhalers (even only as needed), please bring them with you on the day of your procedure.   The [facility] providers would like to encourage you to use [facility] to communicate with providers for non-urgent requests or questions.  Due to long hold times on the telephone, sending your provider a message by [facility] may be a faster and more efficient way to get a response.  Please allow 48 business hours for a response.  Please remember that this is for non-urgent requests.   It was a pleasure to see you today!  Thank you for trusting me with your gastrointestinal care!    Christia Reading, MD

## 2021-08-18 NOTE — Progress Notes (Signed)
Chief Complaint: Colon cancer screening, alternating diarrhea and constipation  HPI : 66 year old female with history of asthma, GERD, migraines, obesity presents with colon cancer screening and alternating bowel habits  She has typically been fairly regular with 2 BMs per day. Over the last month she has been getting very constipated and then the bowel movements are so large that she has to break it up or so loose that she can barely make it to the bathroom on time. She has been having to deal with a lot of burping and flatulence. She has history of H pylori and PUD, and has been treated to eradication in the past (confirmed via breath test). She has lost about 50 lbs over the last year deliberately. Her bowel habits have been limiting her ability to leave the house. She follows a low fat low sodium diet. The only new medications have been Effexor and atorvastatin over the last few months. She does take diclofenac and naproxen PRN for migraines. She has been having some lower ab pain that feels like some bad menstrual cramps. The pain tends to come on with the loose stools. She has not been taking any fiber supplements. Denies dysphagia, odynophagia, N&V, melena, hematochezia. She does have some reflux issues, which are under fairly good control. Last colonoscopy was >20 years ago when she was found to have colon polyps. Denies history of GI cancers. Endorses prior appendectomy and cholecystectomy.   Wt Readings from Last 3 Encounters:  08/18/21 (!) 311 lb 3.2 oz (141.2 kg)  06/22/21 (!) 312 lb (141.5 kg)  05/05/21 (!) 318 lb (144.2 kg)   Past Medical History:  Diagnosis Date   Angina at rest [facility])    Arthritis    Asthma    Depression    GERD (gastroesophageal reflux disease)    Hypertension    Migraines    Obesity      Past Surgical History:  Procedure Laterality Date   ABDOMINAL HYSTERECTOMY     APPENDECTOMY     CHOLECYSTECTOMY     GALLBLADDER SURGERY  1989   Family History   Problem Relation Age of Onset   Hypertension Mother    Heart disease Mother        3 CABG, stents placements.    Heart attack Mother        late 64s   Cancer Mother    Diabetes Mother    High Cholesterol Mother    Cancer Father    Breast cancer Sister    Breast cancer Sister    Breast cancer Sister    Breast cancer Maternal Aunt    Diabetes Other    Heart disease Other    Social History   Tobacco Use   Smoking status: Former    Packs/day: 3.00    Types: Cigarettes    Quit date: 02/27/2006    Years since quitting: 15.4   Smokeless tobacco: Never  Vaping Use   Vaping Use: Never used  Substance Use Topics   Alcohol use: No   Drug use: No   Current Outpatient Medications  Medication Sig Dispense Refill   acetaminophen (TYLENOL) 650 MG CR tablet Take 1,950 mg by mouth every 6 (six) hours as needed for pain.     albuterol (PROVENTIL HFA;VENTOLIN HFA) 108 (90 Base) MCG/ACT inhaler Inhale 2 puffs into the lungs every 6 (six) hours as needed for wheezing or shortness of breath. 6.7 g 2   amLODipine (NORVASC) 10 MG tablet Take 1 tablet (  10 mg total) by mouth daily. 30 tablet 5   aspirin EC 81 MG tablet Take 1 tablet (81 mg total) by mouth daily. Swallow whole. 90 tablet 3   atorvastatin (LIPITOR) 10 MG tablet Take 1 tablet (10 mg total) by mouth daily. 30 tablet 5   diclofenac (VOLTAREN) 75 MG EC tablet Take 1 tablet (75 mg total) by mouth 2 (two) times daily. 180 tablet 1   Melatonin 10 MG TABS Take 1 tablet by mouth as needed.     pantoprazole (PROTONIX) 40 MG tablet Take 1 tablet (40 mg total) by mouth in the morning. 30 tablet 5   pramipexole (MIRAPEX) 0.25 MG tablet Take 0.25 mg by mouth at bedtime.     sodium chloride (OCEAN) 0.65 % SOLN nasal spray Place 1 spray into both nostrils as needed for congestion.     SUMAtriptan (IMITREX) 25 MG tablet Take one tablet by mouth as needed for Headache. May repeat in 2 hours if headache persists or recurs. MAX 3 tablets/24 hours 10  tablet 3   venlafaxine XR (EFFEXOR-XR) 37.5 MG 24 hr capsule Take 1 capsule (37.5 mg total) by mouth daily with breakfast. DX F41.9, F33.9, G43.109 30 capsule 5   Vitamin D, Ergocalciferol, (DRISDOL) 1.25 MG (50000 UNIT) CAPS capsule Take 50,000 Units by mouth once a week.     No current facility-administered medications for this visit.   Allergies  Allergen Reactions   Fish Allergy Anaphylaxis   Iodine Anaphylaxis   Sulfur Hives   Elemental Sulfur Hives     Review of Systems: All systems reviewed and negative except where noted in HPI.   Physical Exam: BP 130/82    Pulse 74    Ht 5\' 5"  (1.651 m)    Wt (!) 311 lb 3.2 oz (141.2 kg)    SpO2 98%    BMI 51.79 kg/m  Constitutional: Pleasant,well-developed, female in no acute distress. HEENT: Normocephalic and atraumatic. Conjunctivae are normal. No scleral icterus. Cardiovascular: Normal rate, regular rhythm.  Pulmonary/chest: Effort normal and breath sounds normal. No wheezing, rales or rhonchi. Abdominal: Soft, nondistended, tender to palpation in several areas around the abdomen. Bowel sounds active throughout. There are no masses palpable. No hepatomegaly. Extremities: No edema Neurological: Alert and oriented to person place and time. Skin: Skin is warm and dry. No rashes noted. Psychiatric: Normal mood and affect. Behavior is normal.  Labs 03/2021: CBC unremarkable  Labs 06/2021: CMP unremarkable. HCV AB negative.  TTE 05/26/21: 1. Left ventricular ejection fraction, by estimation, is 60 to 65%. The  left ventricle has normal function. The left ventricle has no regional  wall motion abnormalities. There is mild left ventricular hypertrophy.  Left ventricular diastolic parameters  are consistent with Grade I diastolic dysfunction (impaired relaxation).   2. Right ventricular systolic function is normal. The right ventricular  size is normal. Tricuspid regurgitation signal is inadequate for assessing  PA pressure.   3. The  mitral valve is normal in structure. Trivial mitral valve  regurgitation. No evidence of mitral stenosis.   4. The aortic valve is tricuspid. There is mild calcification of the  aortic valve. Aortic valve regurgitation is not visualized. No aortic  stenosis is present.   5. Aortic dilatation noted. There is mild dilatation of the ascending  aorta, measuring 40 mm.   6. The inferior vena cava is normal in size with greater than 50%  respiratory variability, suggesting right atrial pressure of 3 mmHg.   ASSESSMENT AND PLAN: Generalized ab  pain Alternating constipation and diarrhea Flatulence Colon cancer screening History of H pylori Patient presents with alternating bowel habits and issues with flatulence.  She is also noted on exam today to have generalized abdominal discomfort.  Patient is due for colon cancer screening anyway since her last colonoscopy was done over 20 years ago.  She also has a history of H. pylori.  Many of her symptoms do also sound like they could be consistent with IBS.  We will go ahead and have the patient's start a low FODMAP diet and a daily soft fiber supplement.  We will plan for EGD and colonoscopy.  I went over the risks and benefits of these procedures in detail with the patient, and she is agreeable to proceed - Low FODMAP diet - Starting daily fiber supplement - EGD/colonoscopy [facility] with BMI >50  Christia Reading, MD

## 2021-08-27 ENCOUNTER — Other Ambulatory Visit: Payer: Self-pay | Admitting: Physician Assistant

## 2021-08-27 ENCOUNTER — Telehealth: Payer: Medicare Other | Admitting: Physician Assistant

## 2021-08-27 DIAGNOSIS — B9789 Other viral agents as the cause of diseases classified elsewhere: Secondary | ICD-10-CM

## 2021-08-27 DIAGNOSIS — J019 Acute sinusitis, unspecified: Secondary | ICD-10-CM | POA: Diagnosis not present

## 2021-08-27 MED ORDER — FLUTICASONE PROPIONATE 50 MCG/ACT NA SUSP: 2.0000 | Freq: Every day | NASAL | 0 refills | Status: DC

## 2021-08-27 MED ORDER — IPRATROPIUM BROMIDE 0.03 % NA SOLN: 2.0000 | Freq: Two times a day (BID) | NASAL | 0 refills | Status: DC

## 2021-08-27 NOTE — Progress Notes (Signed)
E-Visit for Sinus Problems  We are sorry that you are not feeling well.  Here is how we plan to help!  Based on what you have shared with me it looks like you have sinusitis.  Sinusitis is inflammation and infection in the sinus cavities of the head.  Based on your presentation I believe you most likely have Acute Viral Sinusitis.This is an infection most likely caused by a virus. There is not specific treatment for viral sinusitis other than to help you with the symptoms until the infection runs its course.  You may use an oral decongestant such as Mucinex D or if you have glaucoma or high blood pressure use plain Mucinex. Saline nasal spray help and can safely be used as often as needed for congestion, I have prescribed: Fluticasone nasal spray two sprays in each nostril once a day and Ipratropium Bromide nasal spray 0.03% 2 sprays in eah nostril 2-3 times a day  Some authorities believe that zinc sprays or the use of Echinacea may shorten the course of your symptoms.  Sinus infections are not as easily transmitted as other respiratory infection, however we still recommend that you avoid close contact with loved ones, especially the very young and elderly.  Remember to wash your hands thoroughly throughout the day as this is the number one way to prevent the spread of infection!  Home Care: Only take medications as instructed by your medical team. Do not take these medications with alcohol. A steam or ultrasonic humidifier can help congestion.  You can place a towel over your head and breathe in the steam from hot water coming from a faucet. Avoid close contacts especially the very young and the elderly. Cover your mouth when you cough or sneeze. Always remember to wash your hands.  Get Help Right Away If: You develop worsening fever or sinus pain. You develop a severe head ache or visual changes. Your symptoms persist after you have completed your treatment plan.  Make sure you Understand  these instructions. Will watch your condition. Will get help right away if you are not doing well or get worse.   Thank you for choosing an e-visit.  Your e-visit answers were reviewed by a board certified advanced clinical practitioner to complete your personal care plan. Depending upon the condition, your plan could have included both over the counter or prescription medications.  Please review your pharmacy choice. Make sure the pharmacy is open so you can pick up prescription now. If there is a problem, you may contact your provider through MyChart messaging and have the prescription routed to another pharmacy.  Your safety is important to us. If you have drug allergies check your prescription carefully.   For the next 24 hours you can use MyChart to ask questions about today's visit, request a non-urgent call back, or ask for a work or school excuse. You will get an email in the next two days asking about your experience. I hope that your e-visit has been valuable and will speed your recovery.  I provided 5 minutes of non face-to-face time during this encounter for chart review and documentation.   

## 2021-08-31 DIAGNOSIS — Z0289 Encounter for other administrative examinations: Secondary | ICD-10-CM

## 2021-09-16 ENCOUNTER — Other Ambulatory Visit: Payer: Self-pay

## 2021-09-16 ENCOUNTER — Encounter (INDEPENDENT_AMBULATORY_CARE_PROVIDER_SITE_OTHER): Payer: Self-pay | Admitting: Family Medicine

## 2021-09-16 ENCOUNTER — Ambulatory Visit (INDEPENDENT_AMBULATORY_CARE_PROVIDER_SITE_OTHER): Payer: Medicare Other | Admitting: Family Medicine

## 2021-09-16 VITALS — BP 118/76 | HR 85 | Temp 98.1°F | Ht 65.0 in | Wt 310.0 lb

## 2021-09-16 DIAGNOSIS — I10 Essential (primary) hypertension: Secondary | ICD-10-CM | POA: Diagnosis not present

## 2021-09-16 DIAGNOSIS — E669 Obesity, unspecified: Secondary | ICD-10-CM

## 2021-09-16 DIAGNOSIS — Z1331 Encounter for screening for depression: Secondary | ICD-10-CM | POA: Diagnosis not present

## 2021-09-16 DIAGNOSIS — Z8711 Personal history of peptic ulcer disease: Secondary | ICD-10-CM | POA: Insufficient documentation

## 2021-09-16 DIAGNOSIS — E538 Deficiency of other specified B group vitamins: Secondary | ICD-10-CM | POA: Diagnosis not present

## 2021-09-16 DIAGNOSIS — E559 Vitamin D deficiency, unspecified: Secondary | ICD-10-CM

## 2021-09-16 DIAGNOSIS — R5383 Other fatigue: Secondary | ICD-10-CM | POA: Diagnosis not present

## 2021-09-16 DIAGNOSIS — E782 Mixed hyperlipidemia: Secondary | ICD-10-CM

## 2021-09-16 DIAGNOSIS — R0602 Shortness of breath: Secondary | ICD-10-CM | POA: Diagnosis not present

## 2021-09-16 DIAGNOSIS — E785 Hyperlipidemia, unspecified: Secondary | ICD-10-CM | POA: Diagnosis not present

## 2021-09-16 DIAGNOSIS — R739 Hyperglycemia, unspecified: Secondary | ICD-10-CM

## 2021-09-16 DIAGNOSIS — Z6841 Body Mass Index (BMI) 40.0 and over, adult: Secondary | ICD-10-CM | POA: Diagnosis not present

## 2021-09-17 LAB — LIPID PANEL
Chol/HDL Ratio: 3.5 ratio (ref 0.0–4.4)
Cholesterol, Total: 180 mg/dL (ref 100–199)
HDL: 51 mg/dL (ref >39)
LDL Chol Calc (NIH): 111 mg/dL — ABNORMAL HIGH (ref 0–99)
Triglycerides: 101 mg/dL (ref 0–149)
VLDL Cholesterol Cal: 18 mg/dL (ref 5–40)

## 2021-09-17 LAB — CBC WITH DIFFERENTIAL/PLATELET
Basophils Absolute: 0 10*3/uL (ref 0.0–0.2)
Basos: 1 %
EOS (ABSOLUTE): 0.1 10*3/uL (ref 0.0–0.4)
Eos: 2 %
Hematocrit: 41.7 % (ref 34.0–46.6)
Hemoglobin: 12.9 g/dL (ref 11.1–15.9)
Immature Grans (Abs): 0 10*3/uL (ref 0.0–0.1)
Immature Granulocytes: 0 %
Lymphocytes Absolute: 1.9 10*3/uL (ref 0.7–3.1)
Lymphs: 32 %
MCH: 26.1 pg — ABNORMAL LOW (ref 26.6–33.0)
MCHC: 30.9 g/dL — ABNORMAL LOW (ref 31.5–35.7)
MCV: 84 fL (ref 79–97)
Monocytes Absolute: 0.5 10*3/uL (ref 0.1–0.9)
Monocytes: 8 %
Neutrophils Absolute: 3.5 10*3/uL (ref 1.4–7.0)
Neutrophils: 57 %
Platelets: 274 10*3/uL (ref 150–450)
RBC: 4.95 x10E6/uL (ref 3.77–5.28)
RDW: 13.2 % (ref 11.7–15.4)
WBC: 6 10*3/uL (ref 3.4–10.8)

## 2021-09-17 LAB — COMPREHENSIVE METABOLIC PANEL
ALT: 10 IU/L (ref 0–32)
AST: 14 IU/L (ref 0–40)
Albumin/Globulin Ratio: 1.5 (ref 1.2–2.2)
Albumin: 4 g/dL (ref 3.8–4.8)
Alkaline Phosphatase: 80 IU/L (ref 44–121)
BUN/Creatinine Ratio: 18 (ref 12–28)
BUN: 17 mg/dL (ref 8–27)
Bilirubin Total: 0.4 mg/dL (ref 0.0–1.2)
CO2: 24 mmol/L (ref 20–29)
Calcium: 9.1 mg/dL (ref 8.7–10.3)
Chloride: 103 mmol/L (ref 96–106)
Creatinine, Ser: 0.97 mg/dL (ref 0.57–1.00)
Globulin, Total: 2.7 g/dL (ref 1.5–4.5)
Glucose: 90 mg/dL (ref 70–99)
Potassium: 4.7 mmol/L (ref 3.5–5.2)
Sodium: 143 mmol/L (ref 134–144)
Total Protein: 6.7 g/dL (ref 6.0–8.5)
eGFR: 64 mL/min/{1.73_m2} (ref >59)

## 2021-09-17 LAB — FOLATE: Folate: 6.7 ng/mL (ref >3.0)

## 2021-09-17 LAB — VITAMIN D 25 HYDROXY (VIT D DEFICIENCY, FRACTURES): Vit D, 25-Hydroxy: 45.6 ng/mL (ref 30.0–100.0)

## 2021-09-17 LAB — HEMOGLOBIN A1C
Est. average glucose Bld gHb Est-mCnc: 120 mg/dL
Hgb A1c MFr Bld: 5.8 % — ABNORMAL HIGH (ref 4.8–5.6)

## 2021-09-17 LAB — TSH: TSH: 3 u[IU]/mL (ref 0.450–4.500)

## 2021-09-17 LAB — VITAMIN B12: Vitamin B-12: 213 pg/mL — ABNORMAL LOW (ref 232–1245)

## 2021-09-17 LAB — T4, FREE: Free T4: 1.28 ng/dL (ref 0.82–1.77)

## 2021-09-17 LAB — T3, FREE: T3, Free: 2.9 pg/mL (ref 2.0–4.4)

## 2021-09-17 LAB — INSULIN, RANDOM: INSULIN: 9.3 u[IU]/mL (ref 2.6–24.9)

## 2021-09-21 ENCOUNTER — Ambulatory Visit: Payer: Medicare Other | Admitting: Nurse Practitioner

## 2021-09-21 NOTE — Progress Notes (Signed)
? ? ? ?Chief Complaint:  ? ?OBESITY ?Kristi [name] (MR# [id number]) is a 66 y.o. female who presents for evaluation and treatment of obesity and related comorbidities. Current BMI is Body mass index is 51.59 kg/m?Benjamine Kristi [name] has been struggling with her weight for many years and has been unsuccessful in either losing weight, maintaining weight loss, or reaching her healthy weight goal. ? ?Kristi [name] is currently in the action stage of change and ready to dedicate time achieving and maintaining a healthier weight. Kristi [name] is interested in becoming our patient and working on intensive lifestyle modifications including (but not limited to) diet and exercise for weight loss. ? ?Kristi [name]'s habits were reviewed today and are as follows: she thinks her family will eat healthier with her, her desired weight loss is 130 lbs, she has been heavy most of her life, she started gaining weight in 1976, her heaviest weight ever was 400 pounds, she has significant food cravings issues, she snacks frequently in the evenings, she is frequently drinking liquids with calories, she frequently makes poor food choices, she frequently eats larger portions than normal, and she struggles with emotional eating. ? ?Depression Screen ?Kristi [name]'s Food and Mood (modified PHQ-9) score was 19. ? ?Depression screen [facility] 2/9 09/16/2021  ?Decreased Interest 3  ?Down, Depressed, Hopeless 3  ?PHQ - 2 Score 6  ?Altered sleeping 3  ?Tired, decreased energy 3  ?Change in appetite 3  ?Feeling bad or failure about yourself  1  ?Trouble concentrating 0  ?Moving slowly or fidgety/restless 2  ?Suicidal thoughts 1  ?PHQ-9 Score 19  ?Difficult doing work/chores Very difficult  ? ?Subjective:  ? ?1. Other fatigue ?Shifa admits to daytime somnolence and admits to waking up still tired. Patient has a history of symptoms of daytime fatigue, morning fatigue, and morning headache. Kristi [name] generally gets 4 or 5 hours of sleep per night, and states that she has  nightime awakenings. Snoring is present. Apneic episodes are present. Epworth Sleepiness Score is 7.  ? ?2. SOB (shortness of breath) on exertion ?Kristi [name] notes increasing shortness of breath with exercising and seems to be worsening over time with weight gain. She notes getting out of breath sooner with activity than she used to. This has not gotten worse recently. Kristi [name] denies shortness of breath at rest or orthopnea. ? ?3. Essential hypertension ?Kristi [name]'s blood pressure is controlled on her medications. ? ?4. Other hyperlipidemia ?Kristi [name] is on a statin, and she is working on her diet and exercise. ? ?5. Hyperglycemia ?Kristi [name] is at high risk of hyperglycemia. She is working on her diet and exercise. ? ?6. Vitamin D deficiency ?Kristi [name] is at high risk of Vitamin D deficiency.  ? ?7. B12 deficiency ?Kristi [name] has a history of B12 deficiency and anemia. ? ?Assessment/Plan:  ? ?1. Other fatigue ?Wave does feel that her weight is causing her energy to be lower than it should be. Fatigue may be related to obesity, depression or many other causes. Labs will be ordered, and in the meanwhile, Kristi [name] will focus on self care including making healthy food choices, increasing physical activity and focusing on stress reduction. ? ?- CBC with Differential/Platelet ?- EKG 12-Lead ?- T3, free ?- T4, free ?- TSH ? ?2. SOB (shortness of breath) on exertion ?Kristi [name] does feel that she gets out of breath more easily that she used to when she exercises. Kristi [name]'s shortness of breath appears to be obesity related and exercise induced. She has agreed to work on weight loss and gradually increase exercise to treat  her exercise induced shortness of breath. Will continue to monitor closely. ? ?3. Essential hypertension ?We will check labs today. Kristi [name] will start her Category 2 plan, and we will follow up at her next visit. ? ?- Comprehensive metabolic panel ? ?4. Other hyperlipidemia ?We will check labs  today. Kristi [name] will start her Category 2 plan, and we will follow up at her next visit. ? ?- Lipid panel ? ?5. Hyperglycemia ?Fasting labs will be obtained today, and results with be discussed with Benjamine Kristi [name] in 2 weeks at her follow up visit. In the meanwhile Kristi [name] will start on her Category 2 plan and will work on weight loss efforts. ? ?- Hemoglobin A1c ?- Insulin, random ? ?6. Vitamin D deficiency ?We will check labs today, and we will follow up at Kristi [name] [facility] next office visit. ? ?- VITAMIN D 25 Hydroxy (Vit-D Deficiency, Fractures) ? ?7. B12 deficiency ?The diagnosis was reviewed with the patient. We will check labs today, and will follow up at [facility] next office visit. Orders and follow up as documented in patient record. ? ?- CBC with Differential/Platelet ?- Folate ?- Vitamin B12 ? ?8. Screening for depression ?Kristi [name] had a positive depression screening. Depression is commonly associated with obesity and often results in emotional eating behaviors. We will monitor this closely and work on CBT to help improve the non-hunger eating patterns. Referral to Psychology may be required if no improvement is seen as she continues in our clinic. ? ?9. Obesity with a current BMI of 51.6 ?Kristi [name] is currently in the action stage of change and her goal is to continue with weight loss efforts. I recommend Kristi [name] begin the structured treatment plan as follows: ? ?She has agreed to the Category 2 Plan + 100 calories. ? ?Exercise goals: No exercise has been prescribed for now, while we concentrate on nutritional changes. ? ?Behavioral modification strategies: no skipping meals and meal planning and cooking strategies. ? ?She was informed of the importance of frequent follow-up visits to maximize her success with intensive lifestyle modifications for her multiple health conditions. She was informed we would discuss her lab results at her next visit unless there is a critical issue that needs to be addressed  sooner. Kristi [name] agreed to keep her next visit at the agreed upon time to discuss these results. ? ?Objective:  ? ?Blood pressure 118/76, pulse 85, temperature 98.1 ?F (36.7 ?C), height '5\' 5"'$  (1.651 m), weight (!) 310 lb (140.6 kg), SpO2 99 %. Body mass index is 51.59 kg/m?. ? ?EKG: Normal sinus rhythm, rate 85 BPM. ? ?Indirect Calorimeter completed today shows a VO2 of 268 and a REE of 1843.  Her calculated basal metabolic rate is 0177 thus her basal metabolic rate is worse than expected. ? ?General: Cooperative, alert, well developed, in no acute distress. ?HEENT: Conjunctivae and lids unremarkable. ?Cardiovascular: Regular rhythm.  ?Lungs: Normal work of breathing. ?Neurologic: No focal deficits.  ? ?Lab Results  ?Component Value Date  ? CREATININE 0.97 09/16/2021  ? BUN 17 09/16/2021  ? NA 143 09/16/2021  ? K 4.7 09/16/2021  ? CL 103 09/16/2021  ? CO2 24 09/16/2021  ? ?Lab Results  ?Component Value Date  ? ALT 10 09/16/2021  ? AST 14 09/16/2021  ? ALKPHOS 80 09/16/2021  ? BILITOT 0.4 09/16/2021  ? ?Lab Results  ?Component Value Date  ? HGBA1C 5.8 (H) 09/16/2021  ? HGBA1C 5.5 03/09/2017  ? ?Lab Results  ?Component Value Date  ? INSULIN 9.3 09/16/2021  ? ?Lab Results  ?  Component Value Date  ? TSH 3.000 09/16/2021  ? ?Lab Results  ?Component Value Date  ? CHOL 180 09/16/2021  ? HDL 51 09/16/2021  ? LDLCALC 111 (H) 09/16/2021  ? TRIG 101 09/16/2021  ? CHOLHDL 3.5 09/16/2021  ? ?Lab Results  ?Component Value Date  ? WBC 6.0 09/16/2021  ? HGB 12.9 09/16/2021  ? HCT 41.7 09/16/2021  ? MCV 84 09/16/2021  ? PLT 274 09/16/2021  ? ?No results found for: IRON, TIBC, FERRITIN ?Obesity Behavioral Intervention:  ? ?Approximately 15 minutes were spent on the discussion below. ? ?ASK: ?We discussed the diagnosis of obesity with Benjamine Kristi [name] today and Brenya agreed to give [place] permission to discuss obesity behavioral modification therapy today. ? ?ASSESS: ?Danyeal has the diagnosis of obesity and her BMI today is 51.6.  Eloyce is in the action stage of change.  ? ?ADVISE: ?Syeda was educated on the multiple health risks of obesity as well as the benefit of weight loss to improve her health. She was advised of the need for long t

## 2021-09-28 ENCOUNTER — Encounter: Payer: Medicare Other | Admitting: Nurse Practitioner

## 2021-09-29 NOTE — Progress Notes (Signed)
This encounter was created in error - please disregard.

## 2021-09-30 ENCOUNTER — Encounter (INDEPENDENT_AMBULATORY_CARE_PROVIDER_SITE_OTHER): Payer: Self-pay | Admitting: Family Medicine

## 2021-09-30 ENCOUNTER — Other Ambulatory Visit: Payer: Self-pay

## 2021-09-30 ENCOUNTER — Ambulatory Visit (INDEPENDENT_AMBULATORY_CARE_PROVIDER_SITE_OTHER): Payer: Medicare Other | Admitting: Family Medicine

## 2021-09-30 VITALS — BP 120/79 | HR 82 | Temp 98.2°F | Ht 65.0 in | Wt 305.0 lb

## 2021-09-30 DIAGNOSIS — Z6841 Body Mass Index (BMI) 40.0 and over, adult: Secondary | ICD-10-CM

## 2021-09-30 DIAGNOSIS — E538 Deficiency of other specified B group vitamins: Secondary | ICD-10-CM

## 2021-09-30 DIAGNOSIS — R7303 Prediabetes: Secondary | ICD-10-CM | POA: Diagnosis not present

## 2021-09-30 DIAGNOSIS — E669 Obesity, unspecified: Secondary | ICD-10-CM | POA: Diagnosis not present

## 2021-09-30 DIAGNOSIS — E782 Mixed hyperlipidemia: Secondary | ICD-10-CM

## 2021-09-30 DIAGNOSIS — E7849 Other hyperlipidemia: Secondary | ICD-10-CM

## 2021-09-30 MED ORDER — METFORMIN HCL 500 MG PO TABS: 500.0000 mg | ORAL_TABLET | Freq: Every day | ORAL | 0 refills | Status: DC

## 2021-10-02 ENCOUNTER — Other Ambulatory Visit: Payer: Self-pay

## 2021-10-02 ENCOUNTER — Encounter (HOSPITAL_COMMUNITY): Payer: Self-pay | Admitting: Internal Medicine

## 2021-10-05 ENCOUNTER — Ambulatory Visit (INDEPENDENT_AMBULATORY_CARE_PROVIDER_SITE_OTHER): Payer: Medicare Other | Admitting: Nurse Practitioner

## 2021-10-05 ENCOUNTER — Encounter: Payer: Self-pay | Admitting: Nurse Practitioner

## 2021-10-05 ENCOUNTER — Other Ambulatory Visit: Payer: Self-pay | Admitting: *Deleted

## 2021-10-05 VITALS — BP 124/80 | HR 67 | Temp 97.1°F | Ht 65.0 in | Wt 306.0 lb

## 2021-10-05 DIAGNOSIS — F339 Major depressive disorder, recurrent, unspecified: Secondary | ICD-10-CM | POA: Diagnosis not present

## 2021-10-05 DIAGNOSIS — I872 Venous insufficiency (chronic) (peripheral): Secondary | ICD-10-CM | POA: Diagnosis not present

## 2021-10-05 DIAGNOSIS — M159 Polyosteoarthritis, unspecified: Secondary | ICD-10-CM

## 2021-10-05 DIAGNOSIS — I10 Essential (primary) hypertension: Secondary | ICD-10-CM | POA: Diagnosis not present

## 2021-10-05 DIAGNOSIS — E538 Deficiency of other specified B group vitamins: Secondary | ICD-10-CM

## 2021-10-05 DIAGNOSIS — F419 Anxiety disorder, unspecified: Secondary | ICD-10-CM

## 2021-10-05 DIAGNOSIS — E782 Mixed hyperlipidemia: Secondary | ICD-10-CM

## 2021-10-05 DIAGNOSIS — G43109 Migraine with aura, not intractable, without status migrainosus: Secondary | ICD-10-CM

## 2021-10-05 DIAGNOSIS — G2581 Restless legs syndrome: Secondary | ICD-10-CM | POA: Diagnosis not present

## 2021-10-05 MED ORDER — VENLAFAXINE HCL ER 75 MG PO CP24: 75.0000 mg | ORAL_CAPSULE | Freq: Every day | ORAL | 1 refills | Status: DC

## 2021-10-05 MED ORDER — ATORVASTATIN CALCIUM 10 MG PO TABS
10.0000 mg | ORAL_TABLET | Freq: Every day | ORAL | 1 refills | Status: DC
Start: 2021-10-05 — End: 2022-03-11

## 2021-10-05 NOTE — Progress Notes (Signed)
? ? ?Careteam: ?Patient Care Team: ?Lauree Chandler, NP as PCP - General (Geriatric Medicine) ? ?PLACE OF SERVICE:  ?[facility]  ?Advanced Directive information ?Does Patient Have a Medical Advance Directive?: No, Would patient like information on creating a medical advance directive?: No - Patient declined ? ?Allergies  ?Allergen Reactions  ? Fish Allergy Anaphylaxis  ? Iodine Anaphylaxis  ? Sulfur Hives  ? Elemental Sulfur Hives  ? ? ?Chief Complaint  ?Patient presents with  ? Medical Management of Chronic Issues  ?  3 month follow-up. Discuss need for colonoscopy and covid booster or post pone if patient refuses. [facility] verified  ? ? ? ?HPI: Patient is a 66 y.o. female for 3 months up.  ? ?Migraines with aura- no issues, still takes Imitrex when needed. Get flashing lights and can smell things that aren't present.  ? ?Hypertension-  120's SBP at home, on Amlodipine 10 mg QD . No issues or concerns.  ? ?Arthritis - Takes diclofenac 75 mg BID and Tylenol 650 mg Q6H. States it helps. Works great, no issues.  ? ?Depression- Managed with Venlafaxine 37.5 mg, also crochets as therapy   However, recently medication is no longer as effective as before. Would like a dose increase. ? ?Rest leg syndrome- Takes Mirapex 0.25 mg, helps a lot. No issues or concerns.  ? ?Hyperlipidemia- Takes Atorvastatin 10 mg QD, no issues or concerns. Does not have any issues.  ? ?Diet: balanced, seen by weight management.  ? ?Exercise: Does workout running errands and taking walks. ? ?Schedule colonoscopy on Monday 4/10 ? ?Review of Systems:  ?Review of Systems  ?Constitutional:  Negative for chills, fever, malaise/fatigue and weight loss.  ?Respiratory:  Negative for cough, sputum production and shortness of breath.   ?Cardiovascular:  Negative for chest pain, palpitations and leg swelling.  ?Gastrointestinal:  Negative for abdominal pain, constipation, diarrhea, heartburn, nausea and vomiting.  ?Genitourinary:  Negative for dysuria,  frequency and urgency.  ?Musculoskeletal:  Positive for joint pain.  ?Neurological:  Positive for tingling.  ?     Tingling in legs   ?Psychiatric/Behavioral:  Positive for depression.   ? ?Past Medical History:  ?Diagnosis Date  ? Anemia   ? Angina at rest [facility])   ? Anxiety   ? Arthritis   ? Asthma   ? B12 deficiency   ? Back pain   ? Chest pain   ? Constipation   ? Depression   ? Edema of both lower extremities   ? Fatigue   ? Gallbladder problem   ? GERD (gastroesophageal reflux disease)   ? Glaucoma   ? History of stomach ulcers   ? History of stomach ulcers   ? Hyperlipidemia   ? Hypertension   ? Joint pain   ? Migraines   ? Multiple food allergies   ? Multiple food allergies   ? Obesity   ? Osteoarthritis   ? Pre-diabetes   ? Sleep apnea   ? SOB (shortness of breath)   ? ?Past Surgical History:  ?Procedure Laterality Date  ? ABDOMINAL HYSTERECTOMY    ? APPENDECTOMY    ? CHOLECYSTECTOMY    ? GALLBLADDER SURGERY  1989  ? ?Social History: ?  reports that she quit smoking about 15 years ago. Her smoking use included cigarettes. She smoked an average of 3 packs per day. She has never used smokeless tobacco. She reports that she does not drink alcohol and does not use drugs. ? ?Family History  ?Problem Relation  Age of Onset  ? Stroke Mother   ? Hyperlipidemia Mother   ? Hypertension Mother   ? Heart disease Mother   ?     3 CABG, stents placements.   ? Heart attack Mother   ?     late 104s  ? Diabetes Mother   ? High Cholesterol Mother   ? Kidney disease Mother   ? Depression Mother   ? Sleep apnea Mother   ? Alcoholism Mother   ? Obesity Mother   ? Cancer Father   ? Breast cancer Sister   ? Breast cancer Sister   ? Breast cancer Sister   ? Breast cancer Maternal Aunt   ? Diabetes Other   ? Heart disease Other   ? ? ?Medications: ?Patient's Medications  ?New Prescriptions  ? No medications on file  ?Previous Medications  ? ACETAMINOPHEN (TYLENOL) 650 MG CR TABLET    Take 1,950 mg by mouth every 6 (six) hours as  needed for pain.  ? ALBUTEROL (PROVENTIL HFA;VENTOLIN HFA) 108 (90 BASE) MCG/ACT INHALER    Inhale 2 puffs into the lungs every 6 (six) hours as needed for wheezing or shortness of breath.  ? AMLODIPINE (NORVASC) 10 MG TABLET    Take 1 tablet (10 mg total) by mouth daily.  ? ASPIRIN EC 81 MG TABLET    Take 1 tablet (81 mg total) by mouth daily. Swallow whole.  ? ATORVASTATIN (LIPITOR) 10 MG TABLET    Take 1 tablet (10 mg total) by mouth daily.  ? DICLOFENAC (VOLTAREN) 75 MG EC TABLET    Take 1 tablet (75 mg total) by mouth 2 (two) times daily.  ? MELATONIN 10 MG TABS    Take 1 tablet by mouth as needed.  ? METFORMIN (GLUCOPHAGE) 500 MG TABLET    Take 1 tablet (500 mg total) by mouth daily with breakfast.  ? PANTOPRAZOLE (PROTONIX) 40 MG TABLET    Take 1 tablet (40 mg total) by mouth in the morning.  ? PRAMIPEXOLE (MIRAPEX) 0.25 MG TABLET    Take 0.25 mg by mouth at bedtime.  ? SODIUM CHLORIDE (OCEAN) 0.65 % SOLN NASAL SPRAY    Place 1 spray into both nostrils as needed for congestion.  ? SUMATRIPTAN (IMITREX) 25 MG TABLET    Take one tablet by mouth as needed for Headache. May repeat in 2 hours if headache persists or recurs. MAX 3 tablets/24 hours  ? VITAMIN D, ERGOCALCIFEROL, (DRISDOL) 1.25 MG (50000 UNIT) CAPS CAPSULE    Take 50,000 Units by mouth once a week.  ?Modified Medications  ? Modified Medication Previous Medication  ? VENLAFAXINE XR (EFFEXOR-XR) 75 MG 24 HR CAPSULE venlafaxine XR (EFFEXOR-XR) 37.5 MG 24 hr capsule  ?    Take 1 capsule (75 mg total) by mouth daily with breakfast. DX F41.9, F33.9, G43.109    Take 1 capsule (37.5 mg total) by mouth daily with breakfast. DX F41.9, F33.9, G43.109  ?Discontinued Medications  ? No medications on file  ? ? ?Physical Exam: ? ?Vitals:  ? 10/05/21 0843  ?BP: 124/80  ?Pulse: 67  ?Temp: (!) 97.1 ?F (36.2 ?C)  ?TempSrc: Temporal  ?SpO2: 98%  ?Weight: (!) 306 lb (138.8 kg)  ?Height: '5\' 5"'$  (1.651 m)  ? ?Body mass index is 50.92 kg/m?. ?Wt Readings from Last 3  Encounters:  ?10/05/21 (!) 306 lb (138.8 kg)  ?09/30/21 (!) 305 lb (138.3 kg)  ?09/16/21 (!) 310 lb (140.6 kg)  ? ? ?Physical Exam ?Constitutional:   ?  General: She is not in acute distress. ?   Appearance: She is obese. She is not ill-appearing.  ?HENT:  ?   Right Ear: Tympanic membrane, ear canal and external ear normal.  ?   Left Ear: Tympanic membrane, ear canal and external ear normal.  ?   Mouth/Throat:  ?   Mouth: Mucous membranes are moist.  ?   Pharynx: Oropharynx is clear. No oropharyngeal exudate or posterior oropharyngeal erythema.  ?   Comments: No teeth, wears dentures  ?Eyes:  ?   Conjunctiva/sclera: Conjunctivae normal.  ?Cardiovascular:  ?   Rate and Rhythm: Normal rate and regular rhythm.  ?   Pulses: Normal pulses.  ?   Heart sounds: Normal heart sounds.  ?Pulmonary:  ?   Effort: Pulmonary effort is normal.  ?   Breath sounds: Normal breath sounds.  ?Abdominal:  ?   General: Abdomen is flat. Bowel sounds are normal.  ?Musculoskeletal:     ?   General: Normal range of motion.  ?Skin: ?   General: Skin is warm and dry.  ?   Coloration: Skin is not jaundiced or pale.  ?   Findings: No bruising, erythema, lesion or rash.  ?   Comments: -Appearance of hyperpigmentation noted to bilateral LE, painful to touch also has varicose veins on both lower legs  ?Neurological:  ?   Mental Status: She is alert and oriented to person, place, and time.  ?Psychiatric:     ?   Mood and Affect: Mood normal.     ?   Behavior: Behavior normal.     ?   Thought Content: Thought content normal.     ?   Judgment: Judgment normal.  ? ? ?Labs reviewed: ?Basic Metabolic Panel: ?Recent Labs  ?  05/05/21 ?0000 06/09/21 ?0803 06/22/21 ?1403 09/16/21 ?4765  ?NA 141  --  142 143  ?K 4.5  --  3.9 4.7  ?CL 100  --  104 103  ?CO2 25  --  27 24  ?GLUCOSE 78  --  73 90  ?BUN 11  --  10 17  ?CREATININE 0.85 0.80 0.80 0.97  ?CALCIUM 8.9  --  8.6 9.1  ?TSH  --   --   --  3.000  ? ?Liver Function Tests: ?Recent Labs  ?  03/16/21 ?1209  06/22/21 ?1403 09/16/21 ?0904  ?AST '15 16 14  '$ ?ALT '12 11 10  '$ ?ALKPHOS  --   --  80  ?BILITOT 0.5 0.5 0.4  ?PROT 6.9 6.6 6.7  ?ALBUMIN  --   --  4.0  ? ?No results for input(s): LIPASE, AMYLASE in the last 8760 hours. ?No r

## 2021-10-05 NOTE — Patient Instructions (Signed)
Start Vitamin  b12 1000 mcg by mouth daily ? ? ?

## 2021-10-05 NOTE — Addendum Note (Signed)
Addended by: Rafael Bihari A on: 10/05/2021 11:05 AM ? ? Modules accepted: Orders ? ?

## 2021-10-05 NOTE — Telephone Encounter (Signed)
Pharmacy requested refill

## 2021-10-05 NOTE — Progress Notes (Signed)
? ? ? ?Chief Complaint:  ? ?OBESITY ?Kristi [name] is here to discuss her progress with her obesity treatment plan along with follow-up of her obesity related diagnoses. Kristi [name] is on the Category 2 Plan + 100 calories and states she is following her eating plan approximately 70% of the time. Kristi [name] states she is doing 0 minutes 0 times per week. ? ?Today's visit was #: 2 ?Starting weight: 310 lbs  ?Starting date: 09/16/2021 ?Today's weight: 305 lbs ?Today's date: 09/30/2021 ?Total lbs lost to date: 5 ?Total lbs lost since last in-office visit: 5 ? ?Interim History: Kristi [name] has done well with weight loss. She made some deviation from the plan adding margarine and decrease protein. She is working on meal plan.  ? ?Subjective:  ? ?1. Other hyperlipidemia ?Kristi [name]'s LDL is above goal but a lot better than last year now that she is on Lipitor. I discussed labs with the patient today.  ? ?2. B12 deficiency ?Kristi [name] is not on B12. She struggled to eat all of the protein on her plan, which may be a bout of her problem. I discussed labs with the patient today.  ? ?3. Pre-diabetes ?Kristi [name] has a new diagnosis of pre-diabetes. Her A1c is elevated at 5.8. She has a family history of diabetes mellitus and personal history of gestational diabetes mellitus. I discussed labs with the patient today.  ? ?Assessment/Plan:  ? ?1. Other hyperlipidemia ?Cardiovascular risk and specific lipid/LDL goals reviewed. We discussed several lifestyle modifications today. Kristi [name] will continue Lipitor and diet. We will recheck labs in 3 months. Orders and follow up as documented in patient record.  ? ?2. B12 deficiency ?The diagnosis was reviewed with the patient. Kristi [name] is to work on following her B12 rich plan. We will recheck labs in 3 months. Orders and follow up as documented in patient record. ? ?3. Pre-diabetes ?Kristi [name] agreed to start metformin 500 mg q AM  with food, with no refills. She will continue her diet and weight  loss to help decrease the risk of diabetes. We will recheck labs in 3 months. ? ?- metFORMIN (GLUCOPHAGE) 500 MG tablet; Take 1 tablet (500 mg total) by mouth daily with breakfast.  Dispense: 30 tablet; Refill: 0 ? ?4. Obesity with a current BMI of 50.8 ?Kristi [name] is currently in the action stage of change. As such, her goal is to continue with weight loss efforts. She has agreed to the Category 2 Plan.  ? ?Behavioral modification strategies: increasing lean protein intake and meal planning and cooking strategies. ? ?Kristi [name] has agreed to follow-up with our clinic in 2 weeks. She was informed of the importance of frequent follow-up visits to maximize her success with intensive lifestyle modifications for her multiple health conditions.  ? ?Objective:  ? ?Blood pressure 120/79, pulse 82, temperature 98.2 ?F (36.8 ?C), height '5\' 5"'$  (1.651 m), weight (!) 305 lb (138.3 kg), SpO2 98 %. ?Body mass index is 50.75 kg/m?. ? ?General: Cooperative, alert, well developed, in no acute distress. ?HEENT: Conjunctivae and lids unremarkable. ?Cardiovascular: Regular rhythm.  ?Lungs: Normal work of breathing. ?Neurologic: No focal deficits.  ? ?Lab Results  ?Component Value Date  ? CREATININE 0.97 09/16/2021  ? BUN 17 09/16/2021  ? NA 143 09/16/2021  ? K 4.7 09/16/2021  ? CL 103 09/16/2021  ? CO2 24 09/16/2021  ? ?Lab Results  ?Component Value Date  ? ALT 10 09/16/2021  ? AST 14 09/16/2021  ? ALKPHOS 80 09/16/2021  ? BILITOT 0.4 09/16/2021  ? ?Lab Results  ?  Component Value Date  ? HGBA1C 5.8 (H) 09/16/2021  ? HGBA1C 5.5 03/09/2017  ? ?Lab Results  ?Component Value Date  ? INSULIN 9.3 09/16/2021  ? ?Lab Results  ?Component Value Date  ? TSH 3.000 09/16/2021  ? ?Lab Results  ?Component Value Date  ? CHOL 180 09/16/2021  ? HDL 51 09/16/2021  ? LDLCALC 111 (H) 09/16/2021  ? TRIG 101 09/16/2021  ? CHOLHDL 3.5 09/16/2021  ? ?Lab Results  ?Component Value Date  ? VD25OH 45.6 09/16/2021  ? VD25OH 49 03/16/2021  ? ?Lab Results  ?Component  Value Date  ? WBC 6.0 09/16/2021  ? HGB 12.9 09/16/2021  ? HCT 41.7 09/16/2021  ? MCV 84 09/16/2021  ? PLT 274 09/16/2021  ? ?No results found for: IRON, TIBC, FERRITIN ? ?Obesity Behavioral Intervention:  ? ?Approximately 15 minutes were spent on the discussion below. ? ?ASK: ?We discussed the diagnosis of obesity with Kristi [name] today and Kristi [name] agreed to give [place] permission to discuss obesity behavioral modification therapy today. ? ?ASSESS: ?Kristi [name] has the diagnosis of obesity and her BMI today is 50.8. Kristi [name] is in the action stage of change.  ? ?ADVISE: ?Kristi [name] was educated on the multiple health risks of obesity as well as the benefit of weight loss to improve her health. She was advised of the need for long term treatment and the importance of lifestyle modifications to improve her current health and to decrease her risk of future health problems. ? ?AGREE: ?Multiple dietary modification options and treatment options were discussed and Kristi [name] agreed to follow the recommendations documented in the above note. ? ?ARRANGE: ?Kristi [name] was educated on the importance of frequent visits to treat obesity as outlined per CMS and USPSTF guidelines and agreed to schedule her next follow up appointment today. ? ?Attestation Statements:  ? ?Reviewed by clinician on day of visit: allergies, medications, problem list, medical history, surgical history, family history, social history, and previous encounter notes. ? ? ?I, Trixie Dredge, am acting as transcriptionist for Dennard Nip, MD. ? ?I have reviewed the above documentation for accuracy and completeness, and I agree with the above. -  Dennard Nip, MD ? ? ?

## 2021-10-12 ENCOUNTER — Telehealth: Payer: Self-pay | Admitting: Internal Medicine

## 2021-10-12 ENCOUNTER — Ambulatory Visit (HOSPITAL_COMMUNITY): Admission: RE | Admit: 2021-10-12 | Payer: Medicare Other | Source: Home / Self Care | Admitting: Internal Medicine

## 2021-10-12 HISTORY — DX: Prediabetes: R73.03

## 2021-10-12 SURGERY — COLONOSCOPY WITH PROPOFOL
Anesthesia: Monitor Anesthesia Care

## 2021-10-12 NOTE — Telephone Encounter (Signed)
Called pt and informed her that she has been placed on a wait list for July. Once the provider's availability has been released, she will receive a call to reschedule procedure. Verbalized acceptance and understanding. ?

## 2021-10-12 NOTE — Telephone Encounter (Signed)
Patient sent a My Chart message wanting to reschedule her endoscopy/colonoscopy.  Please call patient and advise.  Thank you. ?

## 2021-10-15 ENCOUNTER — Encounter (INDEPENDENT_AMBULATORY_CARE_PROVIDER_SITE_OTHER): Payer: Self-pay

## 2021-10-15 ENCOUNTER — Ambulatory Visit (INDEPENDENT_AMBULATORY_CARE_PROVIDER_SITE_OTHER): Payer: Medicare Other | Admitting: Nurse Practitioner

## 2021-10-19 DIAGNOSIS — H43393 Other vitreous opacities, bilateral: Secondary | ICD-10-CM | POA: Diagnosis not present

## 2021-10-24 ENCOUNTER — Other Ambulatory Visit: Payer: Self-pay | Admitting: Nurse Practitioner

## 2021-11-03 ENCOUNTER — Other Ambulatory Visit: Payer: Self-pay

## 2021-11-03 ENCOUNTER — Telehealth: Payer: Self-pay

## 2021-11-03 DIAGNOSIS — R198 Other specified symptoms and signs involving the digestive system and abdomen: Secondary | ICD-10-CM

## 2021-11-03 DIAGNOSIS — R109 Unspecified abdominal pain: Secondary | ICD-10-CM

## 2021-11-03 DIAGNOSIS — Z8619 Personal history of other infectious and parasitic diseases: Secondary | ICD-10-CM

## 2021-11-03 NOTE — Telephone Encounter (Signed)
Pt scheduled for colonoscopy and EGD @ [facility] on 01/25/22 @ 845am, arrival time 715am. Amb referral placed for auth purposes. Prep instructions sent via My Chart. ?

## 2021-11-03 NOTE — Telephone Encounter (Signed)
Appears pt has read My Chart message as below: ? ? ?Last read by Kristi [name] "Kristi [name]" at 12:22 PM on 11/03/2021. ?

## 2021-11-12 ENCOUNTER — Telehealth: Payer: Medicare Other | Admitting: Physician Assistant

## 2021-11-12 DIAGNOSIS — R002 Palpitations: Secondary | ICD-10-CM

## 2021-11-12 NOTE — Progress Notes (Signed)
Based on what you shared with me, I feel your condition warrants further evaluation and I recommend that you be seen in a face to face visit. ? ?I am concerned giving the sensation of heartbeats in your head -- this could be due to fluid buildup behind the ears themselves but can be related to BP and vascular causes as well and warrants an in-person examination just to make sure nothing further needed, and so you can get the proper treatment.  ?  ?NOTE: There will be NO CHARGE for this eVisit ?  ?If you are having a true medical emergency please call 911.   ?  ? For an urgent face to face visit, [facility] has six urgent care centers for your convenience:  ?  ? [facility] [place] [facility] ?Get Driving Directions ?[phone] ?[address] ?[place], [place] [address] ?  ? [facility] [place]) ?Get Driving Directions ?[phone] ?[address] ?[place], [place] [address] ? ?[facility] [place] ([place]) ?Get Driving Directions ?[place],  [place]  [address] ? ?[facility] ?Get Driving Directions ?[phone] ?[address], [address] ?[place], [place] [address] ?  ?[facility] [place] ?Get Driving Directions  ?[phone] ?[address] ?[address] ?[place], [place] [address] ?  ?[facility] ?Get Driving Directions ?[phone] ?[address] ?[place], [place] [address] ? ?Your MyChart E-visit questionnaire answers were reviewed by a board certified advanced clinical practitioner to complete your personal care plan based on your specific symptoms.  Thank you for using e-Visits. ?  ? ?

## 2021-11-13 ENCOUNTER — Encounter: Payer: Self-pay | Admitting: Nurse Practitioner

## 2021-11-13 ENCOUNTER — Telehealth (INDEPENDENT_AMBULATORY_CARE_PROVIDER_SITE_OTHER): Payer: Medicare Other | Admitting: Nurse Practitioner

## 2021-11-13 VITALS — BP 135/87

## 2021-11-13 DIAGNOSIS — R197 Diarrhea, unspecified: Secondary | ICD-10-CM | POA: Diagnosis not present

## 2021-11-13 DIAGNOSIS — J069 Acute upper respiratory infection, unspecified: Secondary | ICD-10-CM

## 2021-11-13 DIAGNOSIS — R7303 Prediabetes: Secondary | ICD-10-CM | POA: Diagnosis not present

## 2021-11-13 MED ORDER — METFORMIN HCL 500 MG PO TABS: 500.0000 mg | ORAL_TABLET | Freq: Every day | ORAL | 11 refills | Status: DC

## 2021-11-13 MED ORDER — PRAMIPEXOLE DIHYDROCHLORIDE 0.25 MG PO TABS: 0.2500 mg | ORAL_TABLET | Freq: Every day | ORAL | 11 refills | Status: DC

## 2021-11-13 NOTE — Progress Notes (Addendum)
? ? ?Careteam: ?Patient Care Team: ?Lauree Chandler, NP as PCP - General (Geriatric Medicine) ? ?Advanced Directive information ?Does Patient Have a Medical Advance Directive?: No, Would patient like information on creating a medical advance directive?: No - Patient declined ? ?Allergies  ?Allergen Reactions  ? Fish Allergy Anaphylaxis  ? Iodine Anaphylaxis  ? Sulfur Hives  ? Elemental Sulfur Hives  ? ? ?Chief Complaint  ?Patient presents with  ? Acute Visit  ?  Patient c/o sinus issues, ringing in ears, loose stool x 2 days, and migraines. Visit completed via video visit.   ? ? ? ?HPI: Patient is a 66 y.o. [name] via virtual visit. Reports she feels like her head is going to explode.  ?Feels an echo. Her a thump when she swallows.  ?Taking OTC phenylephrine and tylenol for the pain. ?Having nasal congestion ?No sore throat ?When she lays down will have a cough with yellow sputum.  ?Using saline mist.  ? ?Also having loose stools. ~3 daily, the last 2 days. Today she reports it was some better. Not as watery.  ?Imodium. ?Also having nausea. No vomiting.  ? ? ?Did not go back to weight center because she gained 12 lbs and diet that they gave her made her stomach upset. She went back to old diet and lost 12 lbs.  ? ?Review of Systems:  ?Review of Systems  ?Constitutional:  Positive for chills and malaise/fatigue. Negative for fever.  ?HENT:  Positive for congestion and tinnitus. Negative for hearing loss, sinus pain and sore throat.   ?Respiratory:  Positive for cough and sputum production. Negative for shortness of breath and wheezing.   ?Cardiovascular:  Negative for chest pain and palpitations.  ? ?Past Medical History:  ?Diagnosis Date  ? Anemia   ? Angina at rest [facility])   ? Anxiety   ? Arthritis   ? Asthma   ? B12 deficiency   ? Back pain   ? Chest pain   ? Constipation   ? Depression   ? Edema of both lower extremities   ? Fatigue   ? Gallbladder problem   ? GERD (gastroesophageal reflux disease)   ? Glaucoma    ? History of stomach ulcers   ? History of stomach ulcers   ? Hyperlipidemia   ? Hypertension   ? Joint pain   ? Migraines   ? Multiple food allergies   ? Multiple food allergies   ? Obesity   ? Osteoarthritis   ? Pre-diabetes   ? Sleep apnea   ? SOB (shortness of breath)   ? ?Past Surgical History:  ?Procedure Laterality Date  ? ABDOMINAL HYSTERECTOMY    ? APPENDECTOMY    ? CHOLECYSTECTOMY    ? GALLBLADDER SURGERY  1989  ? ?Social History: ?  reports that she quit smoking about 15 years ago. Her smoking use included cigarettes. She smoked an average of 3 packs per day. She has never used smokeless tobacco. She reports that she does not drink alcohol and does not use drugs. ? ?Family History  ?Problem Relation Age of Onset  ? Stroke Mother   ? Hyperlipidemia Mother   ? Hypertension Mother   ? Heart disease Mother   ?     3 CABG, stents placements.   ? Heart attack Mother   ?     late 56s  ? Diabetes Mother   ? High Cholesterol Mother   ? Kidney disease Mother   ? Depression Mother   ?  Sleep apnea Mother   ? Alcoholism Mother   ? Obesity Mother   ? Cancer Father   ? Breast cancer Sister   ? Breast cancer Sister   ? Breast cancer Sister   ? Breast cancer Maternal Aunt   ? Diabetes Other   ? Heart disease Other   ? ? ?Medications: ?Patient's Medications  ?New Prescriptions  ? No medications on file  ?Previous Medications  ? ACETAMINOPHEN (TYLENOL) 650 MG CR TABLET    Take 1,950 mg by mouth every 6 (six) hours as needed for pain.  ? ALBUTEROL (PROVENTIL HFA;VENTOLIN HFA) 108 (90 BASE) MCG/ACT INHALER    Inhale 2 puffs into the lungs every 6 (six) hours as needed for wheezing or shortness of breath.  ? AMLODIPINE (NORVASC) 10 MG TABLET    Take 1 tablet by mouth daily.  ? ASPIRIN EC 81 MG TABLET    Take 1 tablet (81 mg total) by mouth daily. Swallow whole.  ? ATORVASTATIN (LIPITOR) 10 MG TABLET    Take 1 tablet (10 mg total) by mouth daily.  ? DICLOFENAC (VOLTAREN) 75 MG EC TABLET    Take 1 tablet (75 mg total) by mouth  2 (two) times daily.  ? MELATONIN 10 MG TABS    Take 10 mg by mouth at bedtime as needed (Sleep).  ? METFORMIN (GLUCOPHAGE) 500 MG TABLET    Take 1 tablet (500 mg total) by mouth daily with breakfast.  ? PANTOPRAZOLE (PROTONIX) 40 MG TABLET    Take 1 tablet by mouth in the morning.  ? PRAMIPEXOLE (MIRAPEX) 0.25 MG TABLET    Take 0.25 mg by mouth at bedtime.  ? SODIUM CHLORIDE (OCEAN) 0.65 % SOLN NASAL SPRAY    Place 1 spray into both nostrils daily as needed for congestion.  ? SUMATRIPTAN (IMITREX) 25 MG TABLET    Take one tablet by mouth as needed for Headache. May repeat in 2 hours if headache persists or recurs. MAX 3 tablets/24 hours  ? VENLAFAXINE XR (EFFEXOR-XR) 75 MG 24 HR CAPSULE    Take 1 capsule (75 mg total) by mouth daily with breakfast. DX F41.9, F33.9, G43.109  ? VITAMIN B-12 (CYANOCOBALAMIN) 1000 MCG TABLET    Take 1,000 mcg by mouth daily.  ? VITAMIN D, ERGOCALCIFEROL, (DRISDOL) 1.25 MG (50000 UNIT) CAPS CAPSULE    Take 50,000 Units by mouth once a week. Monday  ?Modified Medications  ? No medications on file  ?Discontinued Medications  ? No medications on file  ? ? ?Physical Exam: ? ?Vitals:  ? 11/13/21 1008  ?BP: 135/87  ? ?There is no height or weight on file to calculate BMI. ?Wt Readings from Last 3 Encounters:  ?10/05/21 (!) 306 lb (138.8 kg)  ?09/30/21 (!) 305 lb (138.3 kg)  ?09/16/21 (!) 310 lb (140.6 kg)  ? ? ?Physical Exam ?Constitutional:   ?   Appearance: Normal appearance.  ?Pulmonary:  ?   Effort: Pulmonary effort is normal.  ?Neurological:  ?   Mental Status: She is alert. Mental status is at baseline.  ?Psychiatric:     ?   Mood and Affect: Mood normal.  ? ? ?Labs reviewed: ?Basic Metabolic Panel: ?Recent Labs  ?  05/05/21 ?0000 06/09/21 ?0803 06/22/21 ?1403 09/16/21 ?3903  ?NA 141  --  142 143  ?K 4.5  --  3.9 4.7  ?CL 100  --  104 103  ?CO2 25  --  27 24  ?GLUCOSE 78  --  73 90  ?BUN  11  --  10 17  ?CREATININE 0.85 0.80 0.80 0.97  ?CALCIUM 8.9  --  8.6 9.1  ?TSH  --   --   --  3.000   ? ?Liver Function Tests: ?Recent Labs  ?  03/16/21 ?1209 06/22/21 ?1403 09/16/21 ?0904  ?AST '15 16 14  '$ ?ALT '12 11 10  '$ ?ALKPHOS  --   --  80  ?BILITOT 0.5 0.5 0.4  ?PROT 6.9 6.6 6.7  ?ALBUMIN  --   --  4.0  ? ?No results for input(s): LIPASE, AMYLASE in the last 8760 hours. ?No results for input(s): AMMONIA in the last 8760 hours. ?CBC: ?Recent Labs  ?  03/16/21 ?1209 09/16/21 ?0904  ?WBC 6.3 6.0  ?NEUTROABS 3,830 3.5  ?HGB 13.2 12.9  ?HCT 40.3 41.7  ?MCV 81.6 84  ?PLT 341 274  ? ?Lipid Panel: ?Recent Labs  ?  03/16/21 ?1209 06/22/21 ?1403 09/16/21 ?0904  ?CHOL 243* 176 180  ?HDL 57 46* 51  ?LDLCALC 163* 105* 111*  ?TRIG 114 141 101  ?CHOLHDL 4.3 3.8 3.5  ? ?TSH: ?Recent Labs  ?  09/16/21 ?0904  ?TSH 3.000  ? ?A1C: ?Lab Results  ?Component Value Date  ? HGBA1C 5.8 (H) 09/16/2021  ? ? ? ?Assessment/Plan ?1. Pre-diabetes ?Refill provided.  ?- metFORMIN (GLUCOPHAGE) 500 MG tablet; Take 1 tablet (500 mg total) by mouth daily with breakfast.  Dispense: 30 tablet; Refill: 11 ? ?2. Diarrhea, unspecified type ?-getting better at this time.  ?Avoid imodium at this time.  ?-bland diet, advance as tolerated ?-maintain hydration.  ?-probiotic twice daily  ? ?3. Viral upper respiratory tract infection ?To stop phenylephrine ?-can use OTC zyrtec ?Netipot or saline wash 1-2 x daily ?Plain nasal saline spray throughout the day as needed ?May use tylenol 500 mg 2 tablets every 8 hours as needed aches and pains or sore throat ?humidifier in the home to help with the dry air ?Mucinex DM by mouth twice daily as needed for cough and chest congestion with full glass of water  ?Keep well hydrated ?Avoid forcefully blowing nose ?Vit c 1000 mg daily ?Vit d 2000 units daily  ? ? ? ?Carlos American. Dewaine Oats, AGNP ? ?[facility] ?[phone]  ? ? ?Virtual Visit via video ? ?I connected with patient on 11/13/21 at 10:00 AM EDT by mychart video and verified that I am speaking with the correct person using two  identifiers. ? ?Location: ?Patient: home ?Provider: [facility] ?  ?I discussed the limitations, risks, security and privacy concerns of performing an evaluation and management service by telephone and the availability o

## 2021-11-13 NOTE — Progress Notes (Signed)
? ?  This service is provided via telemedicine ? ?B/P recorded was performed by patient at home, no other vitals recorded due to video visit ? ?Location of patient (ex: home, work):  Home ? ?Patient consents to a telephone visit: Yes, see telephone visit dated 08/04/21 ? ?Location of the provider (ex: office, home):  [facility], Office  ? ?Name of any referring provider:  N/A ? ?Names of all persons participating in the telemedicine service and their role in the encounter:  S.Chrae B/CMA, Sherrie Mustache, NP, and Patient  ? ?Time spent on call:  7 min with medical assistant  ? ?

## 2021-11-19 ENCOUNTER — Telehealth: Payer: Self-pay

## 2021-11-19 ENCOUNTER — Other Ambulatory Visit: Payer: Self-pay | Admitting: Nurse Practitioner

## 2021-11-19 NOTE — Telephone Encounter (Signed)
Called patient. No answer. Unable to leave message. Will try patient again later.

## 2021-11-19 NOTE — Telephone Encounter (Signed)
Initiated Prior Authorization for the patient's SUMAtriptan Succinate '25MG'$  tablets  Called patient, no answer. Unable to [facility] to get the following info:  Please list all medications that the patient has tried and failed or has a contraindication or intolerance to for this indication:  Will try patient again later.

## 2021-11-20 LAB — FECAL OCCULT BLOOD, IMMUNOCHEMICAL: IFOBT: NEGATIVE

## 2021-11-20 NOTE — Telephone Encounter (Signed)
Called patient. No answer. Unable to leave message.

## 2021-12-23 ENCOUNTER — Other Ambulatory Visit: Payer: Self-pay | Admitting: Nurse Practitioner

## 2021-12-31 ENCOUNTER — Telehealth: Payer: Medicare Other | Admitting: Physician Assistant

## 2021-12-31 DIAGNOSIS — B9789 Other viral agents as the cause of diseases classified elsewhere: Secondary | ICD-10-CM

## 2021-12-31 DIAGNOSIS — J019 Acute sinusitis, unspecified: Secondary | ICD-10-CM

## 2021-12-31 MED ORDER — FLUTICASONE PROPIONATE 50 MCG/ACT NA SUSP: 2.0000 | Freq: Every day | NASAL | 0 refills | Status: DC

## 2021-12-31 MED ORDER — METHYLPREDNISOLONE 4 MG PO TBPK: ORAL_TABLET | ORAL | 0 refills | Status: DC

## 2021-12-31 NOTE — Progress Notes (Signed)
E-Visit for Sinus Problems  We are sorry that you are not feeling well.  Here is how we plan to help!  Based on what you have shared with me it looks like you have sinusitis.  Sinusitis is inflammation and infection in the sinus cavities of the head.  Based on your presentation I believe you most likely have Acute Viral Sinusitis.This is an infection most likely caused by a virus. There is not specific treatment for viral sinusitis other than to help you with the symptoms until the infection runs its course.  You may use an oral decongestant such as Mucinex D or if you have glaucoma or high blood pressure use plain Mucinex. Saline nasal spray help and can safely be used as often as needed for congestion, I have prescribed: Fluticasone nasal spray two sprays in each nostril once a day I have also prescribed a steroid pack to reduce inflammation and improve symptoms, as well as to help resolve headache which I am concerned is a combination of sinus headache and migraine triggered from sinus inflammation.  Some authorities believe that zinc sprays or the use of Echinacea may shorten the course of your symptoms.  Sinus infections are not as easily transmitted as other respiratory infection, however we still recommend that you avoid close contact with loved ones, especially the very young and elderly.  Remember to wash your hands thoroughly throughout the day as this is the number one way to prevent the spread of infection!  Home Care: Only take medications as instructed by your medical team. Do not take these medications with alcohol. A steam or ultrasonic humidifier can help congestion.  You can place a towel over your head and breathe in the steam from hot water coming from a faucet. Avoid close contacts especially the very young and the elderly. Cover your mouth when you cough or sneeze. Always remember to wash your hands.  Get Help Right Away If: You develop worsening fever or sinus pain. You  develop a severe head ache or visual changes. Your symptoms persist after you have completed your treatment plan.  Make sure you Understand these instructions. Will watch your condition. Will get help right away if you are not doing well or get worse.   Thank you for choosing an e-visit.  Your e-visit answers were reviewed by a board certified advanced clinical practitioner to complete your personal care plan. Depending upon the condition, your plan could have included both over the counter or prescription medications.  Please review your pharmacy choice. Make sure the pharmacy is open so you can pick up prescription now. If there is a problem, you may contact your provider through [employer] and have the prescription routed to another pharmacy.  Your safety is important to [place]. If you have drug allergies check your prescription carefully.   For the next 24 hours you can use MyChart to ask questions about today's visit, request a non-urgent call back, or ask for a work or school excuse. You will get an email in the next two days asking about your experience. I hope that your e-visit has been valuable and will speed your recovery.

## 2021-12-31 NOTE — Progress Notes (Signed)
I have spent 5 minutes in review of e-visit questionnaire, review and updating patient chart, medical decision making and response to patient.   Ke[place] Cody Mouhamadou Gittleman, PA-C    

## 2022-01-18 ENCOUNTER — Encounter (HOSPITAL_COMMUNITY): Payer: Self-pay | Admitting: Internal Medicine

## 2022-01-22 ENCOUNTER — Encounter (HOSPITAL_COMMUNITY): Payer: Self-pay | Admitting: Anesthesiology

## 2022-01-25 ENCOUNTER — Ambulatory Visit (HOSPITAL_COMMUNITY): Admission: RE | Admit: 2022-01-25 | Payer: Medicare Other | Source: Ambulatory Visit | Admitting: Internal Medicine

## 2022-01-25 SURGERY — ESOPHAGOGASTRODUODENOSCOPY (EGD) WITH PROPOFOL
Anesthesia: Monitor Anesthesia Care

## 2022-02-08 ENCOUNTER — Encounter: Payer: Self-pay | Admitting: Adult Health

## 2022-02-09 ENCOUNTER — Other Ambulatory Visit: Payer: Self-pay | Admitting: *Deleted

## 2022-02-09 ENCOUNTER — Telehealth: Payer: Medicare Other | Admitting: Nurse Practitioner

## 2022-02-09 ENCOUNTER — Ambulatory Visit (INDEPENDENT_AMBULATORY_CARE_PROVIDER_SITE_OTHER): Payer: Medicare Other | Admitting: Adult Health

## 2022-02-09 ENCOUNTER — Encounter: Payer: Self-pay | Admitting: Adult Health

## 2022-02-09 VITALS — BP 138/80 | HR 88 | Temp 97.1°F | Resp 18 | Ht 65.0 in | Wt 312.0 lb

## 2022-02-09 DIAGNOSIS — E1169 Type 2 diabetes mellitus with other specified complication: Secondary | ICD-10-CM | POA: Diagnosis not present

## 2022-02-09 DIAGNOSIS — R0781 Pleurodynia: Secondary | ICD-10-CM | POA: Diagnosis not present

## 2022-02-09 DIAGNOSIS — H6690 Otitis media, unspecified, unspecified ear: Secondary | ICD-10-CM

## 2022-02-09 DIAGNOSIS — G2581 Restless legs syndrome: Secondary | ICD-10-CM

## 2022-02-09 DIAGNOSIS — M159 Polyosteoarthritis, unspecified: Secondary | ICD-10-CM

## 2022-02-09 MED ORDER — GABAPENTIN 100 MG PO CAPS: 100.0000 mg | ORAL_CAPSULE | Freq: Every day | ORAL | 3 refills | Status: DC

## 2022-02-09 MED ORDER — ONETOUCH VERIO VI STRP
ORAL_STRIP | 3 refills | Status: AC
Start: 2022-02-09 — End: ?

## 2022-02-09 MED ORDER — ONETOUCH VERIO W/DEVICE KIT: PACK | 0 refills | Status: AC

## 2022-02-09 MED ORDER — CEFDINIR 300 MG PO CAPS: 300.0000 mg | ORAL_CAPSULE | Freq: Two times a day (BID) | ORAL | 0 refills | Status: AC

## 2022-02-09 MED ORDER — ONETOUCH ULTRASOFT LANCETS MISC
3 refills | Status: AC
Start: 2022-02-09 — End: ?

## 2022-02-09 NOTE — Telephone Encounter (Signed)
Patient called and stated that she was seen today by [facility] and requested a blood sugar machine. Stated that [place] told her to call her insurance to see what was covered and call us back.   Rx's pended and sent to [facility] for approval.  Patient request Rx's to go to [facility].

## 2022-02-09 NOTE — Patient Instructions (Signed)
Otitis Media, Adult  Otitis media is a condition in which the middle ear is red and swollen (inflamed) and full of fluid. The middle ear is the part of the ear that contains Tant for hearing as well as air that helps send sounds to the brain. The condition usually goes away on its own. What are the causes? This condition is caused by a blockage in the eustachian tube. This tube connects the middle ear to the back of the nose. It normally allows air into the middle ear. The blockage is caused by fluid or swelling. Problems that can cause blockage include: A cold or infection that affects the nose, mouth, or throat. Allergies. An irritant, such as tobacco smoke. Adenoids that have become large. The adenoids are soft tissue located in the back of the throat, behind the nose and the roof of the mouth. Growth or swelling in the upper part of the throat, just behind the nose (nasopharynx). Damage to the ear caused by a change in pressure. This is called barotrauma. What increases the risk? You are more likely to develop this condition if you: Smoke or are exposed to tobacco smoke. Have an opening in the roof of your mouth (cleft palate). Have acid reflux. Have problems in your body's defense system (immune system). What are the signs or symptoms? Symptoms of this condition include: Ear pain. Fever. Problems with hearing. Being tired. Fluid leaking from the ear. Ringing in the ear. How is this treated? This condition can go away on its own within 3-5 days. But if the condition is caused by germs (bacteria) and does not go away on its own, or if it keeps coming back, your doctor may: Give you antibiotic medicines. Give you medicines for pain. Follow these instructions at home: Take over-the-counter and prescription medicines only as told by your doctor. If you were prescribed an antibiotic medicine, take it as told by your doctor. Do not stop taking it even if you start to feel better. Keep  all follow-up visits. Contact a doctor if: You have bleeding from your nose. There is a lump on your neck. You are not feeling better in 5 days. You feel worse instead of better. Get help right away if: You have pain that is not helped with medicine. You have swelling, redness, or pain around your ear. You get a stiff neck. You cannot move part of your face (paralysis). You notice that the bone behind your ear hurts when you touch it. You get a very bad headache. Summary Otitis media means that the middle ear is red, swollen, and full of fluid. This condition usually goes away on its own. If the problem does not go away, treatment may be needed. You may be given medicines to treat the infection or to treat your pain. If you were prescribed an antibiotic medicine, take it as told by your doctor. Do not stop taking it even if you start to feel better. Keep all follow-up visits. This information is not intended to replace advice given to you by your health care provider. Make sure you discuss any questions you have with your health care provider. Document Revised: 09/29/2020 Document Reviewed: 09/29/2020 Elsevier Patient Education  2023 [employer]  

## 2022-02-09 NOTE — Progress Notes (Signed)
[facility] clinic  Provider:  Durenda Age DNP  Code Status:  Full Code  Goals of Care:     02/09/2022    8:29 AM  Advanced Directives  Does Patient Have a Medical Advance Directive? No  Would patient like information on creating a medical advance directive? No - Patient declined     Chief Complaint  Patient presents with   Acute Visit    Pain left side rib cage pain ,right knee,left heel,anxiety, and internal noise in head    HPI: Patient is a 66 y.o. female seen today for an acute visit for left rib cage, left heel and bilateral knee pain. No noted erythema nor edema noted. She takes Diclofenac 75 mg BID for osteoarthritis. She stated that she had costochondritis as a child due to having had whooping cough when she was a child. She had scratchy voice yesterday but voice today is normal. She complains of ringing on bilateral ears and pain on left ear which started 5 days ago. Otoscopy showed left tympanic membrane with erythema. She denies having drainage. She takes Metformin 500 mg daily for diabetes mellitus but does not check her blood sugar. Discussed importance of checking her blood sugar. She complains that her Mirapex does not seem to help her Restless Leg Syndrome. Her legs move a lot at night.  Past Medical History:  Diagnosis Date   Anemia    Angina at rest [facility])    Anxiety    Arthritis    Asthma    B12 deficiency    Back pain    Chest pain    Constipation    Depression    Edema of both lower extremities    Fatigue    Gallbladder problem    GERD (gastroesophageal reflux disease)    Glaucoma    History of stomach ulcers    History of stomach ulcers    Hyperlipidemia    Hypertension    Joint pain    Migraines    Multiple food allergies    Multiple food allergies    Obesity    Osteoarthritis    Pre-diabetes    Sleep apnea    SOB (shortness of breath)     Past Surgical History:  Procedure Laterality Date   ABDOMINAL HYSTERECTOMY     APPENDECTOMY      CHOLECYSTECTOMY     GALLBLADDER SURGERY  1989    Allergies  Allergen Reactions   Fish Allergy Anaphylaxis   Iodine Anaphylaxis   Sulfa Antibiotics Hives    Outpatient Encounter Medications as of 02/09/2022  Medication Sig   acetaminophen (TYLENOL) 650 MG CR tablet Take 650 mg by mouth every 6 (six) hours as needed for pain.   albuterol (PROVENTIL HFA;VENTOLIN HFA) 108 (90 Base) MCG/ACT inhaler Inhale 2 puffs into the lungs every 6 (six) hours as needed for wheezing or shortness of breath.   amLODipine (NORVASC) 10 MG tablet Take 1 tablet by mouth daily.   Ascorbic Acid (VITAMIN C) 1000 MG tablet Take 1,000 mg by mouth daily.   aspirin EC 81 MG tablet Take 1 tablet (81 mg total) by mouth daily. Swallow whole.   atorvastatin (LIPITOR) 10 MG tablet Take 1 tablet (10 mg total) by mouth daily.   b complex vitamins capsule Take 1 capsule by mouth daily.   cefdinir (OMNICEF) 300 MG capsule Take 1 capsule (300 mg total) by mouth 2 (two) times daily for 7 days.   diclofenac (VOLTAREN) 75 MG EC tablet Take 1 tablet (  75 mg total) by mouth 2 (two) times daily.   fluticasone (FLONASE) 50 MCG/ACT nasal spray Place 2 sprays into both nostrils daily.   gabapentin (NEURONTIN) 100 MG capsule Take 1 capsule (100 mg total) by mouth at bedtime.   Melatonin 10 MG TABS Take 10 mg by mouth at bedtime as needed (Sleep).   metFORMIN (GLUCOPHAGE) 500 MG tablet Take 1 tablet (500 mg total) by mouth daily with breakfast.   pantoprazole (PROTONIX) 40 MG tablet Take 1 tablet by mouth in the morning.   sodium chloride (OCEAN) 0.65 % SOLN nasal spray Place 1 spray into both nostrils daily as needed for congestion.   SUMAtriptan (IMITREX) 25 MG tablet Take one tablet by mouth as needed for Headache. May repeat in 2 hours if headache persists or recurs. MAX 3 tablets/24 hours   venlafaxine XR (EFFEXOR-XR) 75 MG 24 hr capsule Take 1 capsule (75 mg total) by mouth daily with breakfast. DX F41.9, F33.9, G43.109   Vitamin  D, Ergocalciferol, (DRISDOL) 1.25 MG (50000 UNIT) CAPS capsule Take 50,000 Units by mouth every Monday.   [DISCONTINUED] pramipexole (MIRAPEX) 0.25 MG tablet Take 1 tablet by mouth at bedtime.   No facility-administered encounter medications on file as of 02/09/2022.    Review of Systems:  Review of Systems  Constitutional:  Negative for appetite change, chills, fatigue and fever.  HENT:  Positive for ear pain, sore throat and voice change. Negative for congestion, hearing loss and rhinorrhea.   Eyes: Negative.   Respiratory:  Positive for cough. Negative for shortness of breath and wheezing.   Cardiovascular:  Negative for chest pain, palpitations and leg swelling.  Gastrointestinal:  Negative for abdominal pain, constipation, diarrhea, nausea and vomiting.  Genitourinary:  Negative for dysuria.  Musculoskeletal:  Negative for arthralgias, back pain and myalgias.       Left rib cage pain  Skin:  Negative for color change, rash and wound.  Neurological:  Negative for dizziness, weakness and headaches.  Psychiatric/Behavioral:  Negative for behavioral problems. The patient is not nervous/anxious.     Health Maintenance  Topic Date Due   URINE MICROALBUMIN  Never done   COLONOSCOPY (Pts 45-12yr Insurance coverage will need to be confirmed)  Never done   COVID-19 Vaccine (4 - Moderna series) 11/24/2020   INFLUENZA VACCINE  02/02/2022   MAMMOGRAM  05/15/2023   TETANUS/TDAP  03/10/2027   Pneumonia Vaccine 66 Years old  Completed   DEXA SCAN  Completed   Hepatitis C Screening  Completed   Zoster Vaccines- Shingrix  Completed   HPV VACCINES  Aged Out    Physical Exam: Vitals:   02/09/22 0842  BP: 138/80  Pulse: 88  Resp: 18  Temp: (!) 97.1 F (36.2 C)  TempSrc: Temporal  SpO2: 99%  Weight: (!) 312 lb (141.5 kg)  Height: '5\' 5"'$  (1.651 m)   Body mass index is 51.92 kg/m. Physical Exam Constitutional:      Appearance: She is obese.     Comments: Morbidly obese.  HENT:      Head: Normocephalic and atraumatic.     Ears:     Comments: Left tympanic membrane erythematous    Nose: Nose normal.     Mouth/Throat:     Mouth: Mucous membranes are moist.     Pharynx: Posterior oropharyngeal erythema present.     Comments: Left tonsils enlarged and erythematous Eyes:     Conjunctiva/sclera: Conjunctivae normal.  Cardiovascular:     Rate and Rhythm: Normal rate and  regular rhythm.  Pulmonary:     Effort: Pulmonary effort is normal.     Breath sounds: Normal breath sounds.  Abdominal:     General: Bowel sounds are normal.     Palpations: Abdomen is soft.  Musculoskeletal:        General: Tenderness present. Normal range of motion.     Cervical back: Normal range of motion.     Comments: Left lower rib cage tender  Skin:    General: Skin is warm and dry.  Neurological:     General: No focal deficit present.     Mental Status: She is alert and oriented to person, place, and time.  Psychiatric:        Mood and Affect: Mood normal.        Behavior: Behavior normal.        Thought Content: Thought content normal.        Judgment: Judgment normal.    Labs reviewed: Basic Metabolic Panel: Recent Labs    05/05/21 0000 06/09/21 0803 06/22/21 1403 09/16/21 0904  NA 141  --  142 143  K 4.5  --  3.9 4.7  CL 100  --  104 103  CO2 25  --  27 24  GLUCOSE 78  --  73 90  BUN 11  --  10 17  CREATININE 0.85 0.80 0.80 0.97  CALCIUM 8.9  --  8.6 9.1  TSH  --   --   --  3.000   Liver Function Tests: Recent Labs    03/16/21 1209 06/22/21 1403 09/16/21 0904  AST '15 16 14  '$ ALT '12 11 10  '$ ALKPHOS  --   --  80  BILITOT 0.5 0.5 0.4  PROT 6.9 6.6 6.7  ALBUMIN  --   --  4.0   No results for input(s): "LIPASE", "AMYLASE" in the last 8760 hours. No results for input(s): "AMMONIA" in the last 8760 hours. CBC: Recent Labs    03/16/21 1209 09/16/21 0904  WBC 6.3 6.0  NEUTROABS 3,830 3.5  HGB 13.2 12.9  HCT 40.3 41.7  MCV 81.6 84  PLT 341 274   Lipid  Panel: Recent Labs    03/16/21 1209 06/22/21 1403 09/16/21 0904  CHOL 243* 176 180  HDL 57 46* 51  LDLCALC 163* 105* 111*  TRIG 114 141 101  CHOLHDL 4.3 3.8 3.5   Lab Results  Component Value Date   HGBA1C 5.8 (H) 09/16/2021    Procedures since last visit: No results found.  Assessment/Plan  1. Rib pain on left side -   denies trauma, will  order imaging - DG Chest 2 View; Future -  continue Tylenol 650 mg Q 6 hours PRN  2. Acute otitis media, unspecified otitis media type - cefdinir (OMNICEF) 300 MG capsule; Take 1 capsule (300 mg total) by mouth 2 (two) times daily for 7 days.  Dispense: 14 capsule; Refill: 0  3. Type 2 diabetes mellitus with other specified complication, without long-term current use of insulin (HCC) Lab Results  Component Value Date   HGBA1C 5.8 (H) 09/16/2021   -  discussed importance of checking blood sugars and said that she'll check what her insurance will cover and call office  4. RLS (restless legs syndrome) -  will discontinue Mirapex and start Gabapentin - gabapentin (NEURONTIN) 100 MG capsule; Take 1 capsule (100 mg total) by mouth at bedtime.  Dispense: 30 capsule; Refill: 3  5. Primary osteoarthritis involving multiple joints -  continue  Diclofenac and Tylenol PRN    Labs/tests ordered:  chest x-ray, 2 views  Next appt:  04/13/2022

## 2022-02-10 ENCOUNTER — Encounter (INDEPENDENT_AMBULATORY_CARE_PROVIDER_SITE_OTHER): Payer: Self-pay

## 2022-02-11 ENCOUNTER — Other Ambulatory Visit: Payer: Self-pay | Admitting: Nurse Practitioner

## 2022-02-11 DIAGNOSIS — M159 Polyosteoarthritis, unspecified: Secondary | ICD-10-CM

## 2022-03-02 ENCOUNTER — Telehealth: Payer: Medicare Other | Admitting: Physician Assistant

## 2022-03-02 DIAGNOSIS — B9689 Other specified bacterial agents as the cause of diseases classified elsewhere: Secondary | ICD-10-CM

## 2022-03-02 DIAGNOSIS — J019 Acute sinusitis, unspecified: Secondary | ICD-10-CM

## 2022-03-02 MED ORDER — AMOXICILLIN-POT CLAVULANATE 875-125 MG PO TABS: 1.0000 | ORAL_TABLET | Freq: Two times a day (BID) | ORAL | 0 refills | Status: DC

## 2022-03-02 NOTE — Progress Notes (Signed)
I have spent 5 minutes in review of e-visit questionnaire, review and updating patient chart, medical decision making and response to patient.   Kadarius Cuffe Cody Aidenjames Heckmann, PA-C    

## 2022-03-02 NOTE — Progress Notes (Signed)

## 2022-03-11 ENCOUNTER — Other Ambulatory Visit: Payer: Self-pay | Admitting: Nurse Practitioner

## 2022-03-25 ENCOUNTER — Other Ambulatory Visit: Payer: Self-pay | Admitting: *Deleted

## 2022-03-25 MED ORDER — ALBUTEROL SULFATE HFA 108 (90 BASE) MCG/ACT IN AERS
2.0000 | INHALATION_SPRAY | Freq: Four times a day (QID) | RESPIRATORY_TRACT | 2 refills | Status: DC | PRN
Start: 2022-03-25 — End: 2022-05-10

## 2022-03-25 NOTE — Telephone Encounter (Signed)
[employer] requested refill.

## 2022-04-13 ENCOUNTER — Encounter: Payer: Medicare Other | Admitting: Nurse Practitioner

## 2022-04-13 ENCOUNTER — Other Ambulatory Visit: Payer: Self-pay | Admitting: Nurse Practitioner

## 2022-04-13 ENCOUNTER — Encounter: Payer: Self-pay | Admitting: Adult Health

## 2022-04-13 ENCOUNTER — Ambulatory Visit: Payer: Medicare Other | Admitting: Adult Health

## 2022-04-13 ENCOUNTER — Encounter: Payer: Self-pay | Admitting: Nurse Practitioner

## 2022-04-13 VITALS — BP 140/82 | HR 87 | Temp 97.6°F | Resp 18 | Ht 65.0 in | Wt 311.0 lb

## 2022-04-13 DIAGNOSIS — M7660 Achilles tendinitis, unspecified leg: Secondary | ICD-10-CM

## 2022-04-13 DIAGNOSIS — H65192 Other acute nonsuppurative otitis media, left ear: Secondary | ICD-10-CM

## 2022-04-13 DIAGNOSIS — G2581 Restless legs syndrome: Secondary | ICD-10-CM

## 2022-04-13 DIAGNOSIS — F339 Major depressive disorder, recurrent, unspecified: Secondary | ICD-10-CM

## 2022-04-13 DIAGNOSIS — G43109 Migraine with aura, not intractable, without status migrainosus: Secondary | ICD-10-CM

## 2022-04-13 DIAGNOSIS — F419 Anxiety disorder, unspecified: Secondary | ICD-10-CM

## 2022-04-13 DIAGNOSIS — E1169 Type 2 diabetes mellitus with other specified complication: Secondary | ICD-10-CM

## 2022-04-13 DIAGNOSIS — J029 Acute pharyngitis, unspecified: Secondary | ICD-10-CM | POA: Diagnosis not present

## 2022-04-13 LAB — POC COVID19 BINAXNOW: SARS Coronavirus 2 Ag: NEGATIVE

## 2022-04-13 MED ORDER — GABAPENTIN 100 MG PO CAPS: 100.0000 mg | ORAL_CAPSULE | Freq: Every day | ORAL | 3 refills | Status: DC

## 2022-04-13 MED ORDER — SACCHAROMYCES BOULARDII 250 MG PO CAPS: 250.0000 mg | ORAL_CAPSULE | Freq: Two times a day (BID) | ORAL | 0 refills | Status: AC

## 2022-04-13 MED ORDER — AMOXICILLIN-POT CLAVULANATE 875-125 MG PO TABS: 1.0000 | ORAL_TABLET | Freq: Two times a day (BID) | ORAL | 0 refills | Status: AC

## 2022-04-13 NOTE — Patient Instructions (Signed)

## 2022-04-13 NOTE — Progress Notes (Signed)
[facility] clinic  Provider: Durenda Age DNP  Code Status:  Full Code  Goals of Care:     02/09/2022    8:29 AM  Advanced Directives  Does Patient Have a Medical Advance Directive? No  Would patient like information on creating a medical advance directive? No - Patient declined     Chief Complaint  Patient presents with   Acute Visit    Patient is here for generalized weakness,itchy throat, bilateral pain in ears Patient also having pain in right achilles area of foot    HPI: Patient is a 66 y.o. female seen today for an acute visit for generalized pain, itchy throat, bilateral ear pain and painful left achilles tendon. Noted throat with erythema. Her throat started hurting yesterday. She, also, started having left ear ringing. Both ears are itching. Noted left ear with slight erythema and injected. She has cough and chills. CBGs ranging from 85 to 253.  She wants a refill on her Gabapentin for her restless leg syndrome. She complains of left achilles tendon tenderness. No trauma. She said that she wrapped tight it with ACE wrap so it started hurting. No edema nor erythema noted.  COVID-19 test done in clinic today was negative  Past Medical History:  Diagnosis Date   Anemia    Angina at rest    Anxiety    Arthritis    Asthma    B12 deficiency    Back pain    Chest pain    Constipation    Depression    Edema of both lower extremities    Fatigue    Gallbladder problem    GERD (gastroesophageal reflux disease)    Glaucoma    History of stomach ulcers    History of stomach ulcers    Hyperlipidemia    Hypertension    Joint pain    Migraines    Multiple food allergies    Multiple food allergies    Obesity    Osteoarthritis    Pre-diabetes    Sleep apnea    SOB (shortness of breath)     Past Surgical History:  Procedure Laterality Date   ABDOMINAL HYSTERECTOMY     APPENDECTOMY     CHOLECYSTECTOMY     GALLBLADDER SURGERY  1989    Allergies  Allergen  Reactions   Fish Allergy Anaphylaxis   Iodine Anaphylaxis   Sulfa Antibiotics Hives    Outpatient Encounter Medications as of 04/13/2022  Medication Sig   acetaminophen (TYLENOL) 650 MG CR tablet Take 650 mg by mouth every 6 (six) hours as needed for pain.   albuterol (VENTOLIN HFA) 108 (90 Base) MCG/ACT inhaler Inhale 2 puffs into the lungs every 6 (six) hours as needed for wheezing or shortness of breath.   amLODipine (NORVASC) 10 MG tablet Take 1 tablet by mouth daily.   amoxicillin-clavulanate (AUGMENTIN) 875-125 MG tablet Take 1 tablet by mouth 2 (two) times daily.   Ascorbic Acid (VITAMIN C) 1000 MG tablet Take 1,000 mg by mouth daily.   aspirin EC 81 MG tablet Take 1 tablet (81 mg total) by mouth daily. Swallow whole.   atorvastatin (LIPITOR) 10 MG tablet Take 1 tablet by mouth daily.   b complex vitamins capsule Take 1 capsule by mouth daily.   Blood Glucose Monitoring Suppl (ONETOUCH VERIO) w/Device KIT Use to test blood sugar twice daily. Dx: E11.69   diclofenac (VOLTAREN) 75 MG EC tablet Take 1 tablet by mouth twice daily.   glucose blood (ONETOUCH VERIO)  test strip Use to test blood sugar twice daily. Dx: E11.69   Lancets (ONETOUCH ULTRASOFT) lancets Use to test blood sugar twice daily. Dx: E11.69   Melatonin 10 MG TABS Take 10 mg by mouth at bedtime as needed (Sleep).   metFORMIN (GLUCOPHAGE) 500 MG tablet Take 1 tablet (500 mg total) by mouth daily with breakfast.   pantoprazole (PROTONIX) 40 MG tablet Take 1 tablet by mouth in the morning.   sodium chloride (OCEAN) 0.65 % SOLN nasal spray Place 1 spray into both nostrils daily as needed for congestion.   SUMAtriptan (IMITREX) 25 MG tablet Take one tablet by mouth as needed for Headache. May repeat in 2 hours if headache persists or recurs. MAX 3 tablets/24 hours   venlafaxine XR (EFFEXOR-XR) 75 MG 24 hr capsule Take 1 capsule (75 mg total) by mouth daily with breakfast. DX F41.9, F33.9, G43.109   Vitamin D, Ergocalciferol,  (DRISDOL) 1.25 MG (50000 UNIT) CAPS capsule Take 1 capsule by mouth weekly.   [DISCONTINUED] gabapentin (NEURONTIN) 100 MG capsule Take 1 capsule (100 mg total) by mouth at bedtime.   gabapentin (NEURONTIN) 100 MG capsule Take 1 capsule (100 mg total) by mouth at bedtime.   No facility-administered encounter medications on file as of 04/13/2022.    Review of Systems:  Review of Systems  Constitutional:  Positive for chills and fatigue. Negative for appetite change and fever.  HENT:  Negative for congestion, ear discharge, hearing loss, rhinorrhea and sore throat.        Ringing and itching of ears  Eyes: Negative.   Respiratory:  Negative for cough, shortness of breath and wheezing.   Cardiovascular:  Negative for chest pain, palpitations and leg swelling.  Gastrointestinal:  Negative for abdominal pain, constipation, diarrhea, nausea and vomiting.  Genitourinary:  Negative for dysuria.  Musculoskeletal:  Negative for arthralgias, back pain and myalgias.  Skin:  Negative for color change, rash and wound.  Neurological:  Negative for dizziness, weakness and headaches.  Psychiatric/Behavioral:  Negative for behavioral problems. The patient is not nervous/anxious.     Health Maintenance  Topic Date Due   Diabetic kidney evaluation - Urine ACR  Never done   COLONOSCOPY (Pts 45-17yr Insurance coverage will need to be confirmed)  Never done   COVID-19 Vaccine (4 - Moderna series) 11/24/2020   INFLUENZA VACCINE  02/02/2022   Diabetic kidney evaluation - GFR measurement  09/17/2022   MAMMOGRAM  05/15/2023   TETANUS/TDAP  03/10/2027   Pneumonia Vaccine 66 Years old  Completed   DEXA SCAN  Completed   Hepatitis C Screening  Completed   Zoster Vaccines- Shingrix  Completed   HPV VACCINES  Aged Out    Physical Exam: Vitals:   04/13/22 1122  BP: (!) 140/82  Pulse: 87  Resp: 18  Temp: 97.6 F (36.4 C)  TempSrc: Temporal  SpO2: 99%  Weight: (!) 311 lb (141.1 kg)  Height: '5\' 5"'   (1.651 m)   Body mass index is 51.75 kg/m. Physical Exam Constitutional:      Appearance: Normal appearance. She is obese.     Comments: Morbidly obese  HENT:     Head: Normocephalic and atraumatic.     Nose: Nose normal.     Mouth/Throat:     Mouth: Mucous membranes are moist.  Eyes:     Conjunctiva/sclera: Conjunctivae normal.  Cardiovascular:     Rate and Rhythm: Normal rate and regular rhythm.  Pulmonary:     Effort: Pulmonary effort is normal.  Breath sounds: Normal breath sounds.  Abdominal:     General: Bowel sounds are normal.     Palpations: Abdomen is soft.  Musculoskeletal:        General: Normal range of motion.     Cervical back: Normal range of motion.  Skin:    General: Skin is warm and dry.  Neurological:     General: No focal deficit present.     Mental Status: She is alert and oriented to person, place, and time.  Psychiatric:        Mood and Affect: Mood normal.        Behavior: Behavior normal.        Thought Content: Thought content normal.        Judgment: Judgment normal.     Labs reviewed: Basic Metabolic Panel: Recent Labs    05/05/21 0000 06/09/21 0803 06/22/21 1403 09/16/21 0904  NA 141  --  142 143  K 4.5  --  3.9 4.7  CL 100  --  104 103  CO2 25  --  27 24  GLUCOSE 78  --  73 90  BUN 11  --  10 17  CREATININE 0.85 0.80 0.80 0.97  CALCIUM 8.9  --  8.6 9.1  TSH  --   --   --  3.000   Liver Function Tests: Recent Labs    06/22/21 1403 09/16/21 0904  AST 16 14  ALT 11 10  ALKPHOS  --  80  BILITOT 0.5 0.4  PROT 6.6 6.7  ALBUMIN  --  4.0   No results for input(s): "LIPASE", "AMYLASE" in the last 8760 hours. No results for input(s): "AMMONIA" in the last 8760 hours. CBC: Recent Labs    09/16/21 0904  WBC 6.0  NEUTROABS 3.5  HGB 12.9  HCT 41.7  MCV 84  PLT 274   Lipid Panel: Recent Labs    06/22/21 1403 09/16/21 0904  CHOL 176 180  HDL 46* 51  LDLCALC 105* 111*  TRIG 141 101  CHOLHDL 3.8 3.5   Lab  Results  Component Value Date   HGBA1C 5.8 (H) 09/16/2021    Procedures since last visit: No results found.  Assessment/Plan  1. RLS (restless legs syndrome) - gabapentin (NEURONTIN) 100 MG capsule; Take 1 capsule (100 mg total) by mouth at bedtime.  Dispense: 30 capsule; Refill: 3  2. Sore throat - POC COVID-19 was negative -  instructed to gargle with warm water and salt TID  3. Other non-recurrent acute nonsuppurative otitis media of left ear - amoxicillin-clavulanate (AUGMENTIN) 875-125 MG tablet; Take 1 tablet by mouth 2 (two) times daily for 7 days.  Dispense: 14 tablet; Refill: 0 - saccharomyces boulardii (FLORASTOR) 250 MG capsule; Take 1 capsule (250 mg total) by mouth 2 (two) times daily for 10 days.  Dispense: 20 capsule; Refill: 0  4. Type 2 diabetes mellitus with other specified complication, without long-term current use of insulin (HCC) Lab Results  Component Value Date   HGBA1C 5.8 (H) 09/16/2021   -   continue Metformin -  CBG checks daily and log to bring to next clinic appointment  5. Achilles tendon pain -  instructed apply ankle brace for support   Labs/tests ordered:  POC COVID-19   Next appt:  04/13/2022

## 2022-04-13 NOTE — Progress Notes (Unsigned)
   This service is provided via telemedicine  No vital signs collected/recorded due to the encounter was a telemedicine visit.   Location of patient (ex: home, work):  Home  Patient consents to a telephone visit: Yes, see telephone visit dated 08/04/2021  Location of the provider (ex: office, home):  [place], [place]   Name of any referring provider:  N/A  Names of all persons participating in the telemedicine service and their role in the encounter:  S.Chrae B/CMA, Sherrie Mustache, NP, and Patient   Time spent on call:  9 min with medical assistant

## 2022-04-14 NOTE — Progress Notes (Signed)
This encounter was created in error - please disregard.

## 2022-05-07 ENCOUNTER — Encounter: Payer: Self-pay | Admitting: Nurse Practitioner

## 2022-05-07 ENCOUNTER — Ambulatory Visit (INDEPENDENT_AMBULATORY_CARE_PROVIDER_SITE_OTHER): Payer: Medicare Other | Admitting: Nurse Practitioner

## 2022-05-07 DIAGNOSIS — Z1211 Encounter for screening for malignant neoplasm of colon: Secondary | ICD-10-CM

## 2022-05-07 DIAGNOSIS — Z0000 Encounter for general adult medical examination without abnormal findings: Secondary | ICD-10-CM

## 2022-05-07 DIAGNOSIS — Z1212 Encounter for screening for malignant neoplasm of rectum: Secondary | ICD-10-CM

## 2022-05-07 NOTE — Progress Notes (Signed)
Subjective:   Kristi [name] is a 66 y.o. female who presents for Medicare Annual (Subsequent) preventive examination.  Review of Systems     Cardiac Risk Factors include: advanced age (>44mn, >>54women);diabetes mellitus;hypertension;sedentary lifestyle;obesity (BMI >30kg/m2);family history of premature cardiovascular disease;dyslipidemia     Objective:    Today's Vitals   05/07/22 1159  PainSc: 8    There is no height or weight on file to calculate BMI.     05/07/2022   11:41 AM 02/09/2022    8:29 AM 11/13/2021   10:10 AM 10/05/2021    8:35 AM 06/22/2021   10:15 AM 04/09/2021    9:53 AM 04/06/2021    3:31 PM  Advanced Directives  Does Patient Have a Medical Advance Directive? Yes No No No Yes Yes No  Type of Advance Directive Living will;Out of facility DNR (pink MOST or yellow form)    Healthcare Power of [place]  Does patient want to make changes to medical advance directive? No - Patient declined    No - Patient declined    Copy of [place]? No - copy requested    No - copy requested No - copy requested   Would patient like information on creating a medical advance directive?  No - Patient declined No - Patient declined No - Patient declined   No - Patient declined    Current Medications (verified) Outpatient Encounter Medications as of 05/07/2022  Medication Sig   acetaminophen (TYLENOL) 650 MG CR tablet Take 650 mg by mouth every 6 (six) hours as needed for pain.   albuterol (VENTOLIN HFA) 108 (90 Base) MCG/ACT inhaler Inhale 2 puffs into the lungs every 6 (six) hours as needed for wheezing or shortness of breath.   amLODipine (NORVASC) 10 MG tablet Take 1 tablet by mouth daily.   aspirin EC 81 MG tablet Take 1 tablet (81 mg total) by mouth daily. Swallow whole.   atorvastatin (LIPITOR) 10 MG tablet Take 1 tablet by mouth daily.   Blood Glucose Monitoring Suppl (ONETOUCH VERIO) w/Device KIT Use to test blood  sugar twice daily. Dx: E11.69   diclofenac (VOLTAREN) 75 MG EC tablet Take 1 tablet by mouth twice daily.   gabapentin (NEURONTIN) 100 MG capsule Take 1 capsule (100 mg total) by mouth at bedtime.   glucose blood (ONETOUCH VERIO) test strip Use to test blood sugar twice daily. Dx: E11.69   Lancets (ONETOUCH ULTRASOFT) lancets Use to test blood sugar twice daily. Dx: E11.69   Melatonin 10 MG TABS Take 10 mg by mouth at bedtime as needed (Sleep).   metFORMIN (GLUCOPHAGE) 500 MG tablet Take 1 tablet (500 mg total) by mouth daily with breakfast.   pantoprazole (PROTONIX) 40 MG tablet Take 1 tablet by mouth in the morning.   sodium chloride (OCEAN) 0.65 % SOLN nasal spray Place 1 spray into both nostrils daily as needed for congestion.   SUMAtriptan (IMITREX) 25 MG tablet Take one tablet by mouth as needed for Headache. May repeat in 2 hours if headache persists or recurs. MAX 3 tablets/24 hours   venlafaxine XR (EFFEXOR-XR) 75 MG 24 hr capsule Take 1 capsule by mouth daily with breakfast.   Vitamin D, Ergocalciferol, (DRISDOL) 1.25 MG (50000 UNIT) CAPS capsule Take 1 capsule by mouth weekly.   [DISCONTINUED] Ascorbic Acid (VITAMIN C) 1000 MG tablet Take 1,000 mg by mouth daily.   [DISCONTINUED] b complex vitamins capsule Take 1 capsule by mouth daily.  No facility-administered encounter medications on file as of 05/07/2022.    Allergies (verified) Fish allergy, Iodine, and Sulfa antibiotics   History: Past Medical History:  Diagnosis Date   Anemia    Angina at rest    Anxiety    Arthritis    Asthma    B12 deficiency    Back pain    Chest pain    Constipation    Depression    Edema of both lower extremities    Fatigue    Gallbladder problem    GERD (gastroesophageal reflux disease)    Glaucoma    History of stomach ulcers    History of stomach ulcers    Hyperlipidemia    Hypertension    Joint pain    Migraines    Multiple food allergies    Multiple food allergies    Obesity     Osteoarthritis    Pre-diabetes    Sleep apnea    SOB (shortness of breath)    Past Surgical History:  Procedure Laterality Date   ABDOMINAL HYSTERECTOMY     APPENDECTOMY     CHOLECYSTECTOMY     GALLBLADDER SURGERY  1989   Family History  Problem Relation Age of Onset   Stroke Mother    Hyperlipidemia Mother    Hypertension Mother    Heart disease Mother        3 CABG, stents placements.    Heart attack Mother        late 44s   Diabetes Mother    High Cholesterol Mother    Kidney disease Mother    Depression Mother    Sleep apnea Mother    Alcoholism Mother    Obesity Mother    Cancer Father    Breast cancer Sister    Breast cancer Sister    Breast cancer Sister    Breast cancer Maternal Aunt    Diabetes Other    Heart disease Other    Social History   Socioeconomic History   Marital status: Divorced    Spouse name: Not on file   Number of children: Not on file   Years of education: Not on file   Highest education level: Not on file  Occupational History   Occupation: Customer service agent  Tobacco Use   Smoking status: Former    Packs/day: 3.00    Types: Cigarettes    Quit date: 02/27/2006    Years since quitting: 16.2   Smokeless tobacco: Never  Vaping Use   Vaping Use: Never used  Substance and Sexual Activity   Alcohol use: No   Drug use: No   Sexual activity: Not Currently    Birth control/protection: Surgical  Other Topics Concern   Not on file  Social History Narrative   Tobacco use, amount per day now:   Past tobacco use, amount per day: 3 packs per day, stopped 02/27/2006   How many years did you use tobacco: 25 years   Alcohol use (drinks per week): None   Diet: Low salt, Low fat.   Do you drink/eat things with caffeine: Coffee   Marital status:  Divorced                                What year were you married?    Do you live in a house, apartment, assisted living, condo, trailer, etc.? House   Is it one or more stories?  How many  persons live in your home? 3   Do you have pets in your home?( please list) Dog, and Cat   Highest Level of education completed? Bachelors.   Current or past profession: Radiation protection practitioner.   Do you exercise?   No                               Type and how often?   Do you have a living will? No   Do you have a DNR form?     No                               If not, do you want to discuss one?   Do you have signed POA/HPOA forms? No                        If so, please bring to you appointment      Do you have any difficulty bathing or dressing yourself? No    Do you have any difficulty preparing food or eating? No   Do you have any difficulty managing your medications? No   Do you have any difficulty managing your finances? No   Do you have any difficulty affording your medications? Yes   Social Determinants of Health   Financial Resource Strain: Not on file  Food Insecurity: Not on file  Transportation Needs: Not on file  Physical Activity: Not on file  Stress: Not on file  Social Connections: Not on file    Tobacco Counseling Counseling given: Not Answered   Clinical Intake:  Pre-visit preparation completed: Yes  Pain : 0-10 Pain Score: 8  (in knee and foot)     BMI - recorded: 51 Nutritional Status: BMI > 30  Obese Diabetes: Yes  How often do you need to have someone help you when you read instructions, pamphlets, or other written materials from your doctor or pharmacy?: 1 - Never  Diabetic?yes         Activities of Daily Living    05/07/2022   11:56 AM  In your present state of health, do you have any difficulty performing the following activities:  Hearing? 0  Vision? 0  Difficulty concentrating or making decisions? 0  Walking or climbing stairs? 1  Dressing or bathing? 0  Doing errands, shopping? 0  Preparing Food and eating ? N  Using the Toilet? N  In the past six months, have you accidently leaked urine? Y  Do you have problems with  loss of bowel control? N  Managing your Medications? N  Managing your Finances? N  Housekeeping or managing your Housekeeping? N    Patient Care Team: Lauree Chandler, NP as PCP - General (Geriatric Medicine)  Indicate any recent Medical Services you may have received from other than [facility] providers in the past year (date may be approximate).     Assessment:   This is a routine wellness examination for Kersey.  Hearing/Vision screen Hearing Screening - Comments:: No hearing issues. Patient does not wear hearing aids Vision Screening - Comments:: Patient has had a vision screening in April 2023. [facility] [place]   Dietary issues and exercise activities discussed: Current Exercise Habits: The patient does not participate in regular exercise at present   Goals Addressed   None  Depression Screen    04/13/2022    9:53 AM 09/16/2021    8:17 AM 06/22/2021   11:32 AM 04/09/2021    9:49 AM 02/23/2021    2:03 PM 03/09/2017    9:50 AM  PHQ 2/9 Scores  PHQ - 2 Score 0 6 0 0 4 5  PHQ- 9 Score  _0 Fall Risk    05/07/2022   11:38 AM 04/13/2022    9:52 AM 02/09/2022    8:29 AM 10/05/2021    8:35 AM 06/22/2021   11:32 AM  Fall Risk   Falls in the past year? 1 0 0 0 1  Number falls in past yr: 0 0 0 0 0  Comment     Chair rolled from under patient  Injury with Fall? 0 0 0 0 0  Risk for fall due to : Impaired balance/gait No Fall Risks No Fall Risks No Fall Risks No Fall Risks  Risk for fall due to: Comment use cane for extra support      Follow up _1     FALL RISK PREVENTION PERTAINING TO THE HOME:  Any stairs in or around the home? Yes  If so, are there any without handrails? Yes  Home free of loose throw rugs in walkways, pet beds, electrical cords, etc? Yes  Adequate lighting in your home to reduce risk of falls?  Yes   ASSISTIVE DEVICES UTILIZED TO PREVENT FALLS:  Life alert? No  Use of a cane, walker or w/c? Yes  Grab bars in the bathroom? No  Shower chair or bench in shower? No  Elevated toilet seat or a handicapped toilet? No   TIMED UP AND GO:  Was the test performed? No .    Cognitive Function:        05/07/2022   11:39 AM 04/09/2021    9:54 AM  6CIT Screen  What Year? 0 points 0 points  What month? 0 points 0 points  What time? 0 points 0 points  Count back from 20 0 points 0 points  Months in reverse 0 points 0 points  Repeat phrase 2 points 2 points  Total Score 2 points 2 points    Immunizations Immunization History  Administered Date(s) Administered   Fluad Quad(high Dose 65+) 03/16/2021   Influenza,inj,Quad PF,6+ Mos 05/11/2016, 03/09/2017   Influenza-Unspecified 03/09/2017, 06/18/2020   Moderna Sars-Covid-2 Vaccination 11/23/2019, 12/21/2019, 09/29/2020   PNEUMOCOCCAL CONJUGATE-20 06/22/2021   Tdap 03/09/2017   Zoster Recombinat (Shingrix) 09/29/2020, 12/01/2020    TDAP status: Up to date  Flu Vaccine status: Due, Education has been provided regarding the importance of this vaccine. Advised may receive this vaccine at local pharmacy or Health Dept. Aware to provide a copy of the vaccination record if obtained from local pharmacy or Health Dept. Verbalized acceptance and understanding.  Pneumococcal vaccine status: Up to date  Covid-19 vaccine status: Information provided on how to obtain vaccines.   Qualifies for Shingles Vaccine? Yes   Zostavax completed No   Shingrix Completed?: Yes  Screening Tests Health Maintenance  Topic Date Due   Diabetic kidney evaluation - Urine ACR  Never done   COLONOSCOPY (Pts 45-62yr Insurance coverage will need to be confirmed)  Never done   COVID-19 Vaccine (4 - Moderna series) 11/24/2020   INFLUENZA VACCINE  02/02/2022   Diabetic kidney evaluation - GFR measurement  09/17/2022   Medicare  Annual Wellness (AWV)   05/08/2023   MAMMOGRAM  05/15/2023   TETANUS/TDAP  03/10/2027   Pneumonia Vaccine 16+ Years old  Completed   DEXA SCAN  Completed   Hepatitis C Screening  Completed   Zoster Vaccines- Shingrix  Completed   HPV VACCINES  Aged Out    Health Maintenance  Health Maintenance Due  Topic Date Due   Diabetic kidney evaluation - Urine ACR  Never done   COLONOSCOPY (Pts 45-75yr Insurance coverage will need to be confirmed)  Never done   COVID-19 Vaccine (4 - Moderna series) 11/24/2020   INFLUENZA VACCINE  02/02/2022    Colorectal cancer screening: Referral to GI placed today. Pt aware the office will call re: appt. Bone Density status: Completed 05/15/21. Results reflect: Bone density results: NORMAL. Repeat every 5 years. Mammogram status: Completed 05/14/21. Repeat every year    Lung Cancer Screening: (Low Dose CT Chest recommended if Age 67-80years, 30 pack-year currently smoking OR have quit w/in 15years.) does not qualify.   Lung Cancer Screening Referral: na  Additional Screening:  Hepatitis C Screening: does qualify; Completed 2022  Vision Screening: Recommended annual ophthalmology exams for early detection of glaucoma and other disorders of the eye. Is the patient up to date with their annual eye exam?  Yes  Who is the provider or what is the name of the office in which the patient attends annual eye exams? [facility] If pt is not established with a provider, would they like to be referred to a provider to establish care? No .   Dental Screening: Recommended annual dental exams for proper oral hygiene  Community Resource Referral / Chronic Care Management: CRR required this visit?  No   CCM required this visit?  No      Plan:     I have personally reviewed and noted the following in the patient's chart:   Medical and social history Use of alcohol, tobacco or illicit drugs  Current medications and supplements including opioid prescriptions. Patient is not  currently taking opioid prescriptions. Functional ability and status Nutritional status Physical activity Advanced directives List of other physicians Hospitalizations, surgeries, and ER visits in previous 12 months Vitals Screenings to include cognitive, depression, and falls Referrals and appointments  In addition, I have reviewed and discussed with patient certain preventive protocols, quality metrics, and best practice recommendations. A written personalized care plan for preventive services as well as general preventive health recommendations were provided to patient.     JLauree Chandler NP   05/07/2022    Virtual Visit via Telephone Note  I connected with patient 05/07/22 at  2:20 PM EDT by telephone and verified that I am speaking with the correct person using two identifiers.  Location: Patient: home Provider: [facility]   I discussed the limitations, risks, security and privacy concerns of performing an evaluation and management service by telephone and the availability of in person appointments. I also discussed with the patient that there may be a patient responsible charge related to this service. The patient expressed understanding and agreed to proceed.   I discussed the assessment and treatment plan with the patient. The patient was provided an opportunity to ask questions and all were answered. The patient agreed with the plan and demonstrated an understanding of the instructions.   The patient was advised to call back or seek an in-person evaluation if the symptoms worsen or if the condition fails to improve as anticipated.  I provided 15 minutes of  non-face-to-face time during this encounter.  [employer]. Harle Battiest Avs printed and mailed

## 2022-05-07 NOTE — Patient Instructions (Signed)
Kristi [name] , Thank you for taking time to come for your Medicare Wellness Visit. I appreciate your ongoing commitment to your health goals. Please review the following plan we discussed and let me know if I can assist you in the future.   Screening recommendations/referrals: Colonoscopy GI referral placed today Mammogram up to date Bone Density up todate Recommended yearly ophthalmology/optometry visit for glaucoma screening and checkup Recommended yearly dental visit for hygiene and checkup  Vaccinations: Influenza vaccine- due annually in September/October Pneumococcal vaccine up tdate Tdap vaccine up to date Shingles vaccine up date    Advanced directives: on file.   Conditions/risks identified: advanced age, diabetes, obesity  Next appointment: yearly, in person    Preventive Care 67 Years and Older, Female Preventive care refers to lifestyle choices and visits with your health care provider that can promote health and wellness. What does preventive care include? A yearly physical exam. This is also called an annual well check. Dental exams once or twice a year. Routine eye exams. Ask your health care provider how often you should have your eyes checked. Personal lifestyle choices, including: Daily care of your teeth and gums. Regular physical activity. Eating a healthy diet. Avoiding tobacco and drug use. Limiting alcohol use. Practicing safe sex. Taking low-dose aspirin every day. Taking vitamin and mineral supplements as recommended by your health care provider. What happens during an annual well check? The services and screenings done by your health care provider during your annual well check will depend on your age, overall health, lifestyle risk factors, and family history of disease. Counseling  Your health care provider may ask you questions about your: Alcohol use. Tobacco use. Drug use. Emotional well-being. Home and relationship well-being. Sexual  activity. Eating habits. History of falls. Memory and ability to understand (cognition). Work and work Statistician. Reproductive health. Screening  You may have the following tests or measurements: Height, weight, and BMI. Blood pressure. Lipid and cholesterol levels. These may be checked every 5 years, or more frequently if you are over 96 years old. Skin check. Lung cancer screening. You may have this screening every year starting at age 67 if you have a 30-pack-year history of smoking and currently smoke or have quit within the past 15 years. Fecal occult blood test (FOBT) of the stool. You may have this test every year starting at age 67. Flexible sigmoidoscopy or colonoscopy. You may have a sigmoidoscopy every 5 years or a colonoscopy every 10 years starting at age 67. Hepatitis C blood test. Hepatitis B blood test. Sexually transmitted disease (STD) testing. Diabetes screening. This is done by checking your blood sugar (glucose) after you have not eaten for a while (fasting). You may have this done every 1-3 years. Bone density scan. This is done to screen for osteoporosis. You may have this done starting at age 67. Mammogram. This may be done every 1-2 years. Talk to your health care provider about how often you should have regular mammograms. Talk with your health care provider about your test results, treatment options, and if necessary, the need for more tests. Vaccines  Your health care provider may recommend certain vaccines, such as: Influenza vaccine. This is recommended every year. Tetanus, diphtheria, and acellular pertussis (Tdap, Td) vaccine. You may need a Td booster every 10 years. Zoster vaccine. You may need this after age 41. Pneumococcal 13-valent conjugate (PCV13) vaccine. One dose is recommended after age 49. Pneumococcal polysaccharide (PPSV23) vaccine. One dose is recommended after age 32. Talk to  your health care provider about which screenings and vaccines  you need and how often you need them. This information is not intended to replace advice given to you by your health care provider. Make sure you discuss any questions you have with your health care provider. Document Released: 07/18/2015 Document Revised: 03/10/2016 Document Reviewed: 04/22/2015 Elsevier Interactive Patient Education  2017 [place] Prevention in the Home Falls can cause injuries. They can happen to people of all ages. There are many things you can do to make your home safe and to help prevent falls. What can I do on the outside of my home? Regularly fix the edges of walkways and driveways and fix any cracks. Remove anything that might make you trip as you walk through a door, such as a raised step or threshold. Trim any bushes or trees on the path to your home. Use bright outdoor lighting. Clear any walking paths of anything that might make someone trip, such as rocks or tools. Regularly check to see if handrails are loose or broken. Make sure that both sides of any steps have handrails. Any raised decks and porches should have guardrails on the edges. Have any leaves, snow, or ice cleared regularly. Use sand or salt on walking paths during winter. Clean up any spills in your garage right away. This includes oil or grease spills. What can I do in the bathroom? Use night lights. Install grab bars by the toilet and in the tub and shower. Do not use towel bars as grab bars. Use non-skid mats or decals in the tub or shower. If you need to sit down in the shower, use a plastic, non-slip stool. Keep the floor dry. Clean up any water that spills on the floor as soon as it happens. Remove soap buildup in the tub or shower regularly. Attach bath mats securely with double-sided non-slip rug tape. Do not have throw rugs and other things on the floor that can make you trip. What can I do in the bedroom? Use night lights. Make sure that you have a light by your bed that  is easy to reach. Do not use any sheets or blankets that are too big for your bed. They should not hang down onto the floor. Have a firm chair that has side arms. You can use this for support while you get dressed. Do not have throw rugs and other things on the floor that can make you trip. What can I do in the kitchen? Clean up any spills right away. Avoid walking on wet floors. Keep items that you use a lot in easy-to-reach places. If you need to reach something above you, use a strong step stool that has a grab bar. Keep electrical cords out of the way. Do not use floor polish or wax that makes floors slippery. If you must use wax, use non-skid floor wax. Do not have throw rugs and other things on the floor that can make you trip. What can I do with my stairs? Do not leave any items on the stairs. Make sure that there are handrails on both sides of the stairs and use them. Fix handrails that are broken or loose. Make sure that handrails are as long as the stairways. Check any carpeting to make sure that it is firmly attached to the stairs. Fix any carpet that is loose or worn. Avoid having throw rugs at the top or bottom of the stairs. If you do have throw rugs, attach  them to the floor with carpet tape. Make sure that you have a light switch at the top of the stairs and the bottom of the stairs. If you do not have them, ask someone to add them for you. What else can I do to help prevent falls? Wear shoes that: Do not have high heels. Have rubber bottoms. Are comfortable and fit you well. Are closed at the toe. Do not wear sandals. If you use a stepladder: Make sure that it is fully opened. Do not climb a closed stepladder. Make sure that both sides of the stepladder are locked into place. Ask someone to hold it for you, if possible. Clearly mark and make sure that you can see: Any grab bars or handrails. First and last steps. Where the edge of each step is. Use tools that help you  move around (mobility aids) if they are needed. These include: Canes. Walkers. Scooters. Crutches. Turn on the lights when you go into a dark area. Replace any light bulbs as soon as they burn out. Set up your furniture so you have a clear path. Avoid moving your furniture around. If any of your floors are uneven, fix them. If there are any pets around you, be aware of where they are. Review your medicines with your doctor. Some medicines can make you feel dizzy. This can increase your chance of falling. Ask your doctor what other things that you can do to help prevent falls. This information is not intended to replace advice given to you by your health care provider. Make sure you discuss any questions you have with your health care provider. Document Released: 04/17/2009 Document Revised: 11/27/2015 Document Reviewed: 07/26/2014 Elsevier Interactive Patient Education  2017 [employer].

## 2022-05-07 NOTE — Progress Notes (Signed)
This service is provided via telemedicine  No vital signs collected/recorded due to the encounter was a telemedicine visit.   Location of patient (ex: home, work):  home  Patient consents to a telephone visit:  yes  Location of the provider (ex: office, home):  home  Name of any referring provider:  Sherrie Mustache, NP  Names of all persons participating in the telemedicine service and their role in the encounter:  Patient and Kristi [name] , CMA  Time spent on call:  10 mins

## 2022-05-10 ENCOUNTER — Telehealth: Payer: Medicare Other | Admitting: Physician Assistant

## 2022-05-10 DIAGNOSIS — J4520 Mild intermittent asthma, uncomplicated: Secondary | ICD-10-CM

## 2022-05-10 MED ORDER — ALBUTEROL SULFATE HFA 108 (90 BASE) MCG/ACT IN AERS: 2.0000 | INHALATION_SPRAY | Freq: Four times a day (QID) | RESPIRATORY_TRACT | 0 refills | Status: DC | PRN

## 2022-05-10 NOTE — Progress Notes (Signed)
Visit for Asthma  Based on what you have shared with me, it looks like you may have a flare up of your asthma.  Asthma is a chronic (ongoing) lung disease which results in airway obstruction, inflammation and hyper-responsiveness.   Asthma symptoms vary from person to person, with common symptoms including nighttime awakening and decreased ability to participate in normal activities as a result of shortness of breath. It is often triggered by changes in weather, changes in the season, changes in air temperature, or inside (home, school, daycare or work) allergens such as animal dander, mold, mildew, woodstoves or cockroaches.   It can also be triggered by hormonal changes, extreme emotion, physical exertion or an upper respiratory tract illness.     It is important to identify the trigger, and then eliminate or avoid the trigger if possible.   If you have been prescribed medications to be taken on a regular basis, it is important to follow the asthma action plan and to follow guidelines to adjust medication in response to increasing symptoms of decreased peak expiratory flow rate  Treatment: I have prescribed: Albuterol (Proventil HFA; Ventolin HFA) 108 (90 Base) MCG/ACT Inhaler 2 puffs into the lungs every six hours as needed for wheezing or shortness of breath  *Of note: We are an Urgent Care facility and cannot prescribe out of state. I have sent a refill of your Albuterol inhaler to a local pharmacy on file ([facility] [address]). Also, it appears on 03/25/22 your primary care office did refill your Albuterol to the [facility] with refills on file. You should be able to contact the [facility] for your refills, but can pick up the one locally today for quicker relief.*  HOME CARE Only take medications as instructed by your medical team. Consider wearing a mask or scarf to  improve breathing air temperature have been shown to decrease irritation and decrease exacerbations Get rest. Taking a steamy shower or using a humidifier may help nasal congestion sand ease sore throat pain. You can place a towel over your head and breathe in the steam from hot water coming from a faucet. Using a saline nasal spray works much the same way.  Cough drops, hare candies and sore throat lozenges may ease your cough.  Avoid close contacts especially the very you and the elderly Cover your mouth if you cough or sneeze Always remember to wash your hands.    GET HELP RIGHT AWAY IF: You develop worsening symptoms; breathlessness at rest, drowsy, confused or agitated, unable to speak in full sentences You have coughing fits You develop a severe headache or visual changes You develop shortness of breath, difficulty breathing or start having chest pain Your symptoms persist after you have completed your treatment plan If your symptoms do not improve within 10 days  MAKE SURE YOU Understand these instructions. Will watch your condition. Will get help right away if you are not doing well or get worse.   Your e-visit answers were reviewed by a board certified advanced clinical practitioner to complete your personal care plan, Depending upon the condition, your plan could have included both over the counter or prescription medications.   Please review your pharmacy choice. Your safety is important to [place]. If you have drug allergies check your prescription carefully.  You can use MyChart to ask questions about today's visit, request a non-urgent  call back, or ask for a work or school excuse for 24 hours related to this e-Visit. If it has  been greater than 24 hours you will need to follow up with your provider, or enter a new e-Visit to address those concerns.   You will get an e-mail in the next two days asking about your experience. I hope that your e-visit has been valuable and will  speed your recovery. Thank you for using e-visits.  I have spent 5 minutes in review of e-visit questionnaire, review and updating patient chart, medical decision making and response to patient.   Mar Daring, PA-C

## 2022-05-14 ENCOUNTER — Telehealth: Payer: Self-pay | Admitting: Internal Medicine

## 2022-05-14 NOTE — Telephone Encounter (Signed)
Patient wanting to reschedule for procedure at [facility]. Please advise.

## 2022-06-02 ENCOUNTER — Other Ambulatory Visit: Payer: Self-pay

## 2022-06-02 DIAGNOSIS — R109 Unspecified abdominal pain: Secondary | ICD-10-CM

## 2022-06-02 DIAGNOSIS — Z8619 Personal history of other infectious and parasitic diseases: Secondary | ICD-10-CM

## 2022-06-02 DIAGNOSIS — R198 Other specified symptoms and signs involving the digestive system and abdomen: Secondary | ICD-10-CM

## 2022-06-02 DIAGNOSIS — Z1211 Encounter for screening for malignant neoplasm of colon: Secondary | ICD-10-CM

## 2022-06-02 NOTE — Telephone Encounter (Signed)
Scheduled patient for her colonoscopy and EGD on 08/12/22 at 1:00 pm Kristi [name]. Called to inform patient. No answer. No voicemail. Will send her the information via mail.

## 2022-06-04 ENCOUNTER — Ambulatory Visit: Payer: Medicare Other | Admitting: Nurse Practitioner

## 2022-06-23 ENCOUNTER — Encounter: Payer: Medicare Other | Admitting: Nurse Practitioner

## 2022-06-25 ENCOUNTER — Telehealth: Payer: Self-pay

## 2022-06-25 NOTE — Progress Notes (Signed)
This encounter was created in error - please disregard.

## 2022-06-25 NOTE — Telephone Encounter (Signed)
PA initiated cover my meds Key number [id number]  Waiting for a reply

## 2022-07-07 ENCOUNTER — Telehealth: Payer: Medicare Other | Admitting: Physician Assistant

## 2022-07-07 DIAGNOSIS — J019 Acute sinusitis, unspecified: Secondary | ICD-10-CM

## 2022-07-07 DIAGNOSIS — G43109 Migraine with aura, not intractable, without status migrainosus: Secondary | ICD-10-CM | POA: Diagnosis not present

## 2022-07-07 DIAGNOSIS — B9789 Other viral agents as the cause of diseases classified elsewhere: Secondary | ICD-10-CM

## 2022-07-07 MED ORDER — FLUTICASONE PROPIONATE 50 MCG/ACT NA SUSP: 2.0000 | Freq: Every day | NASAL | 0 refills | Status: DC

## 2022-07-07 NOTE — Progress Notes (Signed)
E-Visit for Sinus Problems  We are sorry that you are not feeling well.  Here is how we plan to help!  Based on what you have shared with me it looks like you have sinusitis.  Sinusitis is inflammation and infection in the sinus cavities of the head.  Based on your presentation I believe you most likely have Acute Viral Sinusitis.This is an infection most likely caused by a virus. There is not specific treatment for viral sinusitis other than to help you with the symptoms until the infection runs its course.  You may use an oral decongestant such as Mucinex D or if you have glaucoma or high blood pressure use plain Mucinex. Saline nasal spray help and can safely be used as often as needed for congestion, I have prescribed: Fluticasone nasal spray two sprays in each nostril once a day  Some authorities believe that zinc sprays or the use of Echinacea may shorten the course of your symptoms.  Sinus infections are not as easily transmitted as other respiratory infection, however we still recommend that you avoid close contact with loved ones, especially the very young and elderly.  Remember to wash your hands thoroughly throughout the day as this is the number one way to prevent the spread of infection!  Home Care: Only take medications as instructed by your medical team. Do not take these medications with alcohol. A steam or ultrasonic humidifier can help congestion.  You can place a towel over your head and breathe in the steam from hot water coming from a faucet. Avoid close contacts especially the very young and the elderly. Cover your mouth when you cough or sneeze. Always remember to wash your hands.  Get Help Right Away If: You develop worsening fever or sinus pain. You develop a severe head ache or visual changes. Your symptoms persist after you have completed your treatment plan.  Make sure you Understand these instructions. Will watch your condition. Will get help right away if you  are not doing well or get worse.   Thank you for choosing an e-visit.  Your e-visit answers were reviewed by a board certified advanced clinical practitioner to complete your personal care plan. Depending upon the condition, your plan could have included both over the counter or prescription medications.  Please review your pharmacy choice. Make sure the pharmacy is open so you can pick up prescription now. If there is a problem, you may contact your provider through MyChart messaging and have the prescription routed to another pharmacy.  Your safety is important to us. If you have drug allergies check your prescription carefully.   For the next 24 hours you can use MyChart to ask questions about today's visit, request a non-urgent call back, or ask for a work or school excuse. You will get an email in the next two days asking about your experience. I hope that your e-visit has been valuable and will speed your recovery.  I have spent 5 minutes in review of e-visit questionnaire, review and updating patient chart, medical decision making and response to patient.   Matis Monnier M Daysia Vandenboom, PA-C  

## 2022-07-16 ENCOUNTER — Ambulatory Visit: Payer: Medicare Other

## 2022-07-16 ENCOUNTER — Encounter: Payer: Self-pay | Admitting: Podiatry

## 2022-07-16 ENCOUNTER — Ambulatory Visit: Payer: Medicare Other | Admitting: Podiatry

## 2022-07-16 DIAGNOSIS — M7662 Achilles tendinitis, left leg: Secondary | ICD-10-CM

## 2022-07-16 DIAGNOSIS — R52 Pain, unspecified: Secondary | ICD-10-CM

## 2022-07-16 DIAGNOSIS — M722 Plantar fascial fibromatosis: Secondary | ICD-10-CM | POA: Diagnosis not present

## 2022-07-16 DIAGNOSIS — M79672 Pain in left foot: Secondary | ICD-10-CM

## 2022-07-16 MED ORDER — TRIAMCINOLONE ACETONIDE 10 MG/ML IJ SUSP
10.0000 mg | Freq: Once | INTRAMUSCULAR | Status: AC
  Administered 2022-07-16: 10 mg

## 2022-07-16 NOTE — Progress Notes (Unsigned)
Subjective:   Patient ID: Kristi [name], female   DOB: 67 y.o.   MRN: [id number]   HPI Chief Complaint  Patient presents with   Foot Problem    Pain located in the achillies of the left foot and heel    Started a couple of months ago. No injuries. No treatment other than rubbing icy hot on it and tylenol. It wakes her up at night. It hurts constantly but it varies as far as the severity. When she works and then stands up it hurts.    ROS      Objective:  Physical Exam  General: AAO x3, NAD  Dermatological: Skin is warm, dry and supple bilateral. Nails x 10 are well manicured; remaining integument appears unremarkable at this time. There are no open sores, no preulcerative lesions, no rash or signs of infection present.  Vascular: Dorsalis Pedis artery and Posterior Tibial artery pedal pulses are 2/4 bilateral with immedate capillary fill time. Pedal hair growth present. No varicosities and no lower extremity edema present bilateral. There is no pain with calf compression, swelling, warmth, erythema.   Neruologic: Grossly intact via light touch bilateral. Vibratory intact via tuning fork bilateral. Protective threshold with Semmes Wienstein monofilament intact to all pedal sites bilateral. Patellar and Achilles deep tendon reflexes 2+ bilateral. No Babinski or clonus noted bilateral.   Musculoskeletal: flat; achilles/pf; dorsal midfoot spur  Gait: Unassisted, Nonantalgic.       Assessment:  ***     Plan:  ***    -continue -Night splint

## 2022-07-16 NOTE — Patient Instructions (Addendum)
While at your visit today you received a steroid injection in your foot or ankle to help with your pain. Along with having the steroid medication there is some "numbing" medication in the shot that you received. Due to this you may notice some numbness to the area for the next couple of hours.   I would recommend limiting activity for the next few days to help the steroid injection take affect.    The actually benefit from the steroid injection may take up to 2-7 days to see a difference. You may actually experience a small (as in 10%) INCREASE in pain in the first 24 hours---that is common. It would be best if you can ice the area today and take anti-inflammatory medications (such as Ibuprofen, Motrin, or Aleve) if you are able to take these medications. If you were prescribed another medication to help with the pain go ahead and start that medication today    Things to watch out for that you should contact us or a health care provider urgently would include: 1. Unusual (as in more than 10%) increase in pain 2. New fever > 101.5 3. New swelling or redness of the injected area.  4. Streaking of red lines around the area injected.  If you have any questions or concerns about this, please give our office a call at [phone].   ---   Achilles Tendinitis  with Rehab Achilles tendinitis is a disorder of the Achilles tendon. The Achilles tendon connects the large calf muscles (Gastrocnemius and Soleus) to the heel bone (calcaneus). This tendon is sometimes called the heel cord. It is important for pushing-off and standing on your toes and is important for walking, running, or jumping. Tendinitis is often caused by overuse and repetitive microtrauma. SYMPTOMS Pain, tenderness, swelling, warmth, and redness may occur over the Achilles tendon even at rest. Pain with pushing off, or flexing or extending the ankle. Pain that is worsened after or during activity. CAUSES  Overuse sometimes seen with  rapid increase in exercise programs or in sports requiring running and jumping. Poor physical conditioning (strength and flexibility or endurance). Running sports, especially training running down hills. Inadequate warm-up before practice or play or failure to stretch before participation. Injury to the tendon. PREVENTION  Warm up and stretch before practice or competition. Allow time for adequate rest and recovery between practices and competition. Keep up conditioning. Keep up ankle and leg flexibility. Improve or keep muscle strength and endurance. Improve cardiovascular fitness. Use proper technique. Use proper equipment (shoes, skates). To help prevent recurrence, taping, protective strapping, or an adhesive bandage may be recommended for several weeks after healing is complete. PROGNOSIS  Recovery may take weeks to several months to heal. Longer recovery is expected if symptoms have been prolonged. Recovery is usually quicker if the inflammation is due to a direct blow as compared with overuse or sudden strain. RELATED COMPLICATIONS  Healing time will be prolonged if the condition is not correctly treated. The injury must be given plenty of time to heal. Symptoms can reoccur if activity is resumed too soon. Untreated, tendinitis may increase the risk of tendon rupture requiring additional time for recovery and possibly surgery. TREATMENT  The first treatment consists of rest anti-inflammatory medication, and ice to relieve the pain. Stretching and strengthening exercises after resolution of pain will likely help reduce the risk of recurrence. Referral to a physical therapist or athletic trainer for further evaluation and treatment may be helpful. A walking boot or cast may  be recommended to rest the Achilles tendon. This can help break the cycle of inflammation and microtrauma. Arch supports (orthotics) may be prescribed or recommended by your caregiver as an adjunct to therapy and  rest. Surgery to remove the inflamed tendon lining or degenerated tendon tissue is rarely necessary and has shown less than predictable results. MEDICATION  Nonsteroidal anti-inflammatory medications, such as aspirin and ibuprofen, may be used for pain and inflammation relief. Do not take within 7 days before surgery. Take these as directed by your caregiver. Contact your caregiver immediately if any bleeding, stomach upset, or signs of allergic reaction occur. Other minor pain relievers, such as acetaminophen, may also be used. Pain relievers may be prescribed as necessary by your caregiver. Do not take prescription pain medication for longer than 4 to 7 days. Use only as directed and only as much as you need. Cortisone injections are rarely indicated. Cortisone injections may weaken tendons and predispose to rupture. It is better to give the condition more time to heal than to use them. HEAT AND COLD Cold is used to relieve pain and reduce inflammation for acute and chronic Achilles tendinitis. Cold should be applied for 10 to 15 minutes every 2 to 3 hours for inflammation and pain and immediately after any activity that aggravates your symptoms. Use ice packs or an ice massage. Heat may be used before performing stretching and strengthening activities prescribed by your caregiver. Use a heat pack or a warm soak. SEEK MEDICAL CARE IF: Symptoms get worse or do not improve in 2 weeks despite treatment. New, unexplained symptoms develop. Drugs used in treatment may produce side effects.  EXERCISES:  RANGE OF MOTION (ROM) AND STRETCHING EXERCISES - Achilles Tendinitis  These exercises may help you when beginning to rehabilitate your injury. Your symptoms may resolve with or without further involvement from your physician, physical therapist or athletic trainer. While completing these exercises, remember:  Restoring tissue flexibility helps normal motion to return to the joints. This allows healthier,  less painful movement and activity. An effective stretch should be held for at least 30 seconds. A stretch should never be painful. You should only feel a gentle lengthening or release in the stretched tissue.  STRETCH  Gastroc, Standing  Place hands on wall. Extend right / left leg, keeping the front knee somewhat bent. Slightly point your toes inward on your back foot. Keeping your right / left heel on the floor and your knee straight, shift your weight toward the wall, not allowing your back to arch. You should feel a gentle stretch in the right / left calf. Hold this position for 10 seconds. Repeat 3 times. Complete this stretch 2 times per day.  STRETCH  Soleus, Standing  Place hands on wall. Extend right / left leg, keeping the other knee somewhat bent. Slightly point your toes inward on your back foot. Keep your right / left heel on the floor, bend your back knee, and slightly shift your weight over the back leg so that you feel a gentle stretch deep in your back calf. Hold this position for 10 seconds. Repeat 3 times. Complete this stretch 2 times per day.  STRETCH  Gastrocsoleus, Standing  Note: This exercise can place a lot of stress on your foot and ankle. Please complete this exercise only if specifically instructed by your caregiver.  Place the ball of your right / left foot on a step, keeping your other foot firmly on the same step. Hold on to the wall  or a rail for balance. Slowly lift your other foot, allowing your body weight to press your heel down over the edge of the step. You should feel a stretch in your right / left calf. Hold this position for 10 seconds. Repeat this exercise with a slight bend in your knee. Repeat 3 times. Complete this stretch 2 times per day.   STRENGTHENING EXERCISES - Achilles Tendinitis These exercises may help you when beginning to rehabilitate your injury. They may resolve your symptoms with or without further involvement from your  physician, physical therapist or athletic trainer. While completing these exercises, remember:  Muscles can gain both the endurance and the strength needed for everyday activities through controlled exercises. Complete these exercises as instructed by your physician, physical therapist or athletic trainer. Progress the resistance and repetitions only as guided. You may experience muscle soreness or fatigue, but the pain or discomfort you are trying to eliminate should never worsen during these exercises. If this pain does worsen, stop and make certain you are following the directions exactly. If the pain is still present after adjustments, discontinue the exercise until you can discuss the trouble with your clinician.  STRENGTH - Plantar-flexors  Sit with your right / left leg extended. Holding onto both ends of a rubber exercise band/tubing, loop it around the ball of your foot. Keep a slight tension in the band. Slowly push your toes away from you, pointing them downward. Hold this position for 10 seconds. Return slowly, controlling the tension in the band/tubing. Repeat 3 times. Complete this exercise 2 times per day.   STRENGTH - Plantar-flexors  Stand with your feet shoulder width apart. Steady yourself with a wall or table using as little support as needed. Keeping your weight evenly spread over the width of your feet, rise up on your toes.* Hold this position for 10 seconds. Repeat 3 times. Complete this exercise 2 times per day.  *If this is too easy, shift your weight toward your right / left leg until you feel challenged. Ultimately, you may be asked to do this exercise with your right / left foot only.  STRENGTH  Plantar-flexors, Eccentric  Note: This exercise can place a lot of stress on your foot and ankle. Please complete this exercise only if specifically instructed by your caregiver.  Place the balls of your feet on a step. With your hands, use only enough support from a wall or  rail to keep your balance. Keep your knees straight and rise up on your toes. Slowly shift your weight entirely to your right / left toes and pick up your opposite foot. Gently and with controlled movement, lower your weight through your right / left foot so that your heel drops below the level of the step. You will feel a slight stretch in the back of your calf at the end position. Use the healthy leg to help rise up onto the balls of both feet, then lower weight only on the right / left leg again. Build up to 15 repetitions. Then progress to 3 consecutive sets of 15 repetitions.* After completing the above exercise, complete the same exercise with a slight knee bend (about 30 degrees). Again, build up to 15 repetitions. Then progress to 3 consecutive sets of 15 repetitions.* Perform this exercise 2 times per day.  *When you easily complete 3 sets of 15, your physician, physical therapist or athletic trainer may advise you to add resistance by wearing a backpack filled with additional weight.  STRENGTH -  Plantar Flexors, Seated  Sit on a chair that allows your feet to rest flat on the ground. If necessary, sit at the edge of the chair. Keeping your toes firmly on the ground, lift your right / left heel as far as you can without increasing any discomfort in your ankle. Repeat 3 times. Complete this exercise 2 times a day.

## 2022-08-05 ENCOUNTER — Encounter (HOSPITAL_COMMUNITY): Payer: Self-pay | Admitting: Internal Medicine

## 2022-08-05 NOTE — Progress Notes (Signed)
Attempted to obtain medical history via telephone, unable to reach at this time. HIPAA compliant voicemail message left requesting return call to pre surgical testing department.  

## 2022-08-05 NOTE — Therapy (Incomplete)
OUTPATIENT PHYSICAL THERAPY EVALUATION   Patient Name: Kristi [name] MRN: [id number] DOB:Apr 15, 1956, 67 y.o., female Today's Date: 08/05/2022   END OF SESSION:   Past Medical History:  Diagnosis Date   Anemia    Angina at rest    Anxiety    Arthritis    Asthma    B12 deficiency    Back pain    Chest pain    Constipation    Depression    Edema of both lower extremities    Fatigue    Gallbladder problem    GERD (gastroesophageal reflux disease)    Glaucoma    History of stomach ulcers    History of stomach ulcers    Hyperlipidemia    Hypertension    Joint pain    Migraines    Multiple food allergies    Multiple food allergies    Obesity    Osteoarthritis    Pre-diabetes    Sleep apnea    SOB (shortness of breath)    Past Surgical History:  Procedure Laterality Date   ABDOMINAL HYSTERECTOMY     [place]   Patient Active Problem List   Diagnosis Date Noted   Migraine with aura and without status migrainosus, not intractable 10/05/2021   History of stomach ulcers 09/16/2021   Morbid obesity ([place]) 05/05/2021   Dyspnea 05/05/2021   Chest pain 02/24/2017   Atypical chest pain 05/10/2016   HTN (hypertension) 05/10/2016   Hypertensive emergency 05/10/2016   Anxiety 05/10/2016   Gastroesophageal reflux disease with esophagitis     PCP: Lauree Chandler, NP  REFERRING PROVIDER: Trula Slade, DPM  REFERRING DIAG: Achilles tendinitis of left lower extremity  THERAPY DIAG:  No diagnosis found.  Rationale for Evaluation and Treatment: Rehabilitation  ONSET DATE: ***   SUBJECTIVE:  SUBJECTIVE STATEMENT: ***  PERTINENT HISTORY: ***  PAIN:  Are you having pain? Yes:  NPRS scale: ***/10 Pain location: Left ankle and heel Pain description: *** Aggravating factors: *** Relieving factors: ***  PRECAUTIONS: None  WEIGHT BEARING RESTRICTIONS: No  FALLS:  Has patient fallen in last 6  months? {fallsyesno:27318}  OCCUPATION: ***  PLOF: Independent  PATIENT GOALS: Pain relief   OBJECTIVE:  PATIENT SURVEYS:  {rehab surveys:24030}  COGNITION: Overall cognitive status: Within functional limits for tasks assessed     SENSATION: WFL  EDEMA:  {edema:24020}  MUSCLE LENGTH: ***  POSTURE:   ***  PALPATION: ***  LOWER EXTREMITY ROM:  {AROM/PROM:27142} ROM Right eval Left eval  Ankle dorsiflexion    Ankle plantarflexion    Ankle inversion    Ankle eversion     (Blank rows = not tested)  LOWER EXTREMITY MMT:  MMT Right eval Left eval  Hip flexion    Hip extension    Hip abduction    Knee flexion    Knee extension    Ankle dorsiflexion    Ankle plantarflexion    Ankle inversion    Ankle eversion     (Blank rows = not tested)  LOWER EXTREMITY SPECIAL TESTS:  {LEspecialtests:26242}  FUNCTIONAL TESTS:  {Functional tests:24029}  GAIT: Distance walked: *** Assistive device utilized: {Assistive devices:23999} Level of assistance: {Levels of assistance:24026} Comments: ***   TODAY'S TREATMENT:            [facility] Adult PT Treatment:  DATE: 08/06/2022 Therapeutic Exercise: ***  PATIENT EDUCATION:  Education details: Exam findings, POC, HEP Person educated: Patient Education method: Explanation, Demonstration, Tactile cues, Verbal cues, and Handouts Education comprehension: verbalized understanding, returned demonstration, verbal cues required, tactile cues required, and needs further education  HOME EXERCISE PROGRAM: ***   ASSESSMENT: CLINICAL IMPRESSION: Patient is a 67 y.o. female who was seen today for physical therapy evaluation and treatment for chronic left ankle and heel pain. ***  OBJECTIVE IMPAIRMENTS: {opptimpairments:25111}.   ACTIVITY LIMITATIONS: {activitylimitations:27494}  PARTICIPATION LIMITATIONS: {participationrestrictions:25113}  PERSONAL FACTORS: {Personal  factors:25162} are also affecting patient's functional outcome.   REHAB POTENTIAL: Good  CLINICAL DECISION MAKING: Stable/uncomplicated  EVALUATION COMPLEXITY: Low   GOALS: Goals reviewed with patient? Yes  SHORT TERM GOALS: Target date: ***  PT will review FOTO with patient by 3rd visit in order to understand expected progress and outcome with therapy. Baseline: HEP provided at eval Goal status: INITIAL  2.  Patient will demonstrate left ankle DF >/= *** deg in order to improve walking and reduce pain Baseline: left DF *** deg Goal status: INITIAL  3.  Patient will report left ankle and heel pain </= ***/10 in order to reduce functional limitations Baseline: pain ***/10 Goal status: INITIAL  LONG TERM GOALS: Target date: ***  Patient will be I with final HEP to maintain progress from PT. Baseline: HEP provided at eval Goal status: INITIAL  2.  *** Baseline:  Goal status: INITIAL  3.  Patient will demonstrate left ankle strength grossly >/= 4/5 MMT in order to improve weight bearing tolerance and reduce pain with standing and walking tasks Baseline: strength grossly ***/5 MMT Goal status: INITIAL  4.  *** Baseline:  Goal status: INITIAL   PLAN: PT FREQUENCY: 1x/week  PT DURATION: 8 weeks  PLANNED INTERVENTIONS: Therapeutic exercises, Therapeutic activity, Neuromuscular re-education, Balance training, Gait training, Patient/Family education, Self Care, Joint mobilization, Joint manipulation, Aquatic Therapy, Dry Needling, Cryotherapy, Moist heat, Taping, Ionotophoresis '4mg'$ /ml Dexamethasone, Manual therapy, and Re-evaluation  PLAN FOR NEXT SESSION: Review HEP and progress PRN, ***   Hilda Blades, PT, DPT, LAT, ATC 08/05/22  2:27 PM Phone: [phone] Fax: [phone]    Check all possible CPT codes: 66440 - PT Re-evaluation, 97110- Therapeutic Exercise, [phone]- Neuro Re-education, [phone] - Gait Training, [phone] - Manual Therapy, [phone] - Therapeutic  Activities, [phone] - Self Care, [phone] - Iontophoresis, and [id number] - Aquatic therapy    Check all conditions that are expected to impact treatment: Morbid obesity and Musculoskeletal disorders   If treatment provided at initial evaluation, no treatment charged due to lack of authorization.

## 2022-08-06 ENCOUNTER — Ambulatory Visit: Payer: MEDICAID | Admitting: Physical Therapy

## 2022-08-06 NOTE — Therapy (Incomplete)
OUTPATIENT PHYSICAL THERAPY EVALUATION   Patient Name: Kristi [name] MRN: [id number] DOB:1955-07-25, 67 y.o., female Today's Date: 08/06/2022   END OF SESSION:   Past Medical History:  Diagnosis Date   Anemia    Angina at rest    Anxiety    Arthritis    Asthma    B12 deficiency    Back pain    Chest pain    Constipation    Depression    Edema of both lower extremities    Fatigue    Gallbladder problem    GERD (gastroesophageal reflux disease)    Glaucoma    History of stomach ulcers    History of stomach ulcers    Hyperlipidemia    Hypertension    Joint pain    Migraines    Multiple food allergies    Multiple food allergies    Obesity    Osteoarthritis    Pre-diabetes    Sleep apnea    SOB (shortness of breath)    Past Surgical History:  Procedure Laterality Date   ABDOMINAL HYSTERECTOMY     [place]   Patient Active Problem List   Diagnosis Date Noted   Migraine with aura and without status migrainosus, not intractable 10/05/2021   History of stomach ulcers 09/16/2021   Morbid obesity ([place]) 05/05/2021   Dyspnea 05/05/2021   Chest pain 02/24/2017   Atypical chest pain 05/10/2016   HTN (hypertension) 05/10/2016   Hypertensive emergency 05/10/2016   Anxiety 05/10/2016   Gastroesophageal reflux disease with esophagitis     PCP: Lauree Chandler, NP  REFERRING PROVIDER: Trula Slade, DPM  REFERRING DIAG: Achilles tendinitis of left lower extremity  THERAPY DIAG:  No diagnosis found.  Rationale for Evaluation and Treatment: Rehabilitation  ONSET DATE: ***   SUBJECTIVE:  SUBJECTIVE STATEMENT: ***  PERTINENT HISTORY: ***  PAIN:  Are you having pain? Yes:  NPRS scale: ***/10 Pain location: Left ankle and heel Pain description: *** Aggravating factors: *** Relieving factors: ***  PRECAUTIONS: None  WEIGHT BEARING RESTRICTIONS: No  FALLS:  Has patient fallen in last 6  months? {fallsyesno:27318}  OCCUPATION: ***  PLOF: Independent  PATIENT GOALS: Pain relief   OBJECTIVE:  PATIENT SURVEYS:  {rehab surveys:24030}  COGNITION: Overall cognitive status: Within functional limits for tasks assessed     SENSATION: WFL  EDEMA:  {edema:24020}  MUSCLE LENGTH: ***  POSTURE:   ***  PALPATION: ***  LOWER EXTREMITY ROM:  {AROM/PROM:27142} ROM Right eval Left eval  Ankle dorsiflexion    Ankle plantarflexion    Ankle inversion    Ankle eversion     (Blank rows = not tested)  LOWER EXTREMITY MMT:  MMT Right eval Left eval  Hip flexion    Hip extension    Hip abduction    Knee flexion    Knee extension    Ankle dorsiflexion    Ankle plantarflexion    Ankle inversion    Ankle eversion     (Blank rows = not tested)  LOWER EXTREMITY SPECIAL TESTS:  {LEspecialtests:26242}  FUNCTIONAL TESTS:  {Functional tests:24029}  GAIT: Distance walked: *** Assistive device utilized: {Assistive devices:23999} Level of assistance: {Levels of assistance:24026} Comments: ***   TODAY'S TREATMENT:            [facility] Adult PT Treatment:  DATE: 08/06/2022 Therapeutic Exercise: ***  PATIENT EDUCATION:  Education details: Exam findings, POC, HEP Person educated: Patient Education method: Explanation, Demonstration, Tactile cues, Verbal cues, and Handouts Education comprehension: verbalized understanding, returned demonstration, verbal cues required, tactile cues required, and needs further education  HOME EXERCISE PROGRAM: ***   ASSESSMENT: CLINICAL IMPRESSION: Patient is a 67 y.o. female who was seen today for physical therapy evaluation and treatment for chronic left ankle and heel pain. ***  OBJECTIVE IMPAIRMENTS: {opptimpairments:25111}.   ACTIVITY LIMITATIONS: {activitylimitations:27494}  PARTICIPATION LIMITATIONS: {participationrestrictions:25113}  PERSONAL FACTORS: {Personal  factors:25162} are also affecting patient's functional outcome.   REHAB POTENTIAL: Good  CLINICAL DECISION MAKING: Stable/uncomplicated  EVALUATION COMPLEXITY: Low   GOALS: Goals reviewed with patient? Yes  SHORT TERM GOALS: Target date: ***  PT will review FOTO with patient by 3rd visit in order to understand expected progress and outcome with therapy. Baseline: HEP provided at eval Goal status: INITIAL  2.  Patient will demonstrate left ankle DF >/= *** deg in order to improve walking and reduce pain Baseline: left DF *** deg Goal status: INITIAL  3.  Patient will report left ankle and heel pain </= ***/10 in order to reduce functional limitations Baseline: pain ***/10 Goal status: INITIAL  LONG TERM GOALS: Target date: ***  Patient will be I with final HEP to maintain progress from PT. Baseline: HEP provided at eval Goal status: INITIAL  2.  *** Baseline:  Goal status: INITIAL  3.  Patient will demonstrate left ankle strength grossly >/= 4/5 MMT in order to improve weight bearing tolerance and reduce pain with standing and walking tasks Baseline: strength grossly ***/5 MMT Goal status: INITIAL  4.  *** Baseline:  Goal status: INITIAL   PLAN: PT FREQUENCY: 1x/week  PT DURATION: 8 weeks  PLANNED INTERVENTIONS: Therapeutic exercises, Therapeutic activity, Neuromuscular re-education, Balance training, Gait training, Patient/Family education, Self Care, Joint mobilization, Joint manipulation, Aquatic Therapy, Dry Needling, Cryotherapy, Moist heat, Taping, Ionotophoresis '4mg'$ /ml Dexamethasone, Manual therapy, and Re-evaluation  PLAN FOR NEXT SESSION: Review HEP and progress PRN, ***   Hilda Blades, PT, DPT, LAT, ATC 08/06/22  9:57 AM Phone: [phone] Fax: [phone]    Check all possible CPT codes: 36122 - PT Re-evaluation, 97110- Therapeutic Exercise, [phone]- Neuro Re-education, [phone] - Gait Training, [phone] - Manual Therapy, [phone] - Therapeutic  Activities, [phone] - Self Care, [phone] - Iontophoresis, and [id number] - Aquatic therapy    Check all conditions that are expected to impact treatment: Morbid obesity and Musculoskeletal disorders   If treatment provided at initial evaluation, no treatment charged due to lack of authorization.

## 2022-08-09 ENCOUNTER — Telehealth: Payer: Self-pay | Admitting: Internal Medicine

## 2022-08-09 NOTE — Telephone Encounter (Signed)
Inbound call from patient, she have a appointment 2/08 at the hospital need to reschedule , she forgot she have a copay and dont have the money yet.Please advise

## 2022-08-09 NOTE — Telephone Encounter (Signed)
Tried to call the patient back. No answer. Left her a message that I will cancel the EGD/colon she is scheduled for with Dr Lorenso Courier on 08/12/22. She will need and office visit before she can be rescheduled.

## 2022-08-12 ENCOUNTER — Ambulatory Visit (HOSPITAL_COMMUNITY): Admit: 2022-08-12 | Payer: Medicare Other | Admitting: Internal Medicine

## 2022-08-12 ENCOUNTER — Encounter (HOSPITAL_COMMUNITY): Payer: Self-pay

## 2022-08-12 SURGERY — ESOPHAGOGASTRODUODENOSCOPY (EGD) WITH PROPOFOL
Anesthesia: Monitor Anesthesia Care

## 2022-08-13 ENCOUNTER — Ambulatory Visit: Payer: Medicare Other | Attending: Podiatry | Admitting: Physical Therapy

## 2022-08-19 ENCOUNTER — Telehealth: Payer: Medicare Other | Admitting: Physician Assistant

## 2022-08-19 DIAGNOSIS — B9789 Other viral agents as the cause of diseases classified elsewhere: Secondary | ICD-10-CM

## 2022-08-19 DIAGNOSIS — J019 Acute sinusitis, unspecified: Secondary | ICD-10-CM

## 2022-08-19 MED ORDER — AZELASTINE HCL 0.1 % NA SOLN: 1.0000 | Freq: Two times a day (BID) | NASAL | 0 refills | Status: DC

## 2022-08-19 NOTE — Progress Notes (Signed)
E-Visit for Sinus Problems  We are sorry that you are not feeling well.  Here is how we plan to help!  Based on what you have shared with me it looks like you have sinusitis that is triggering a migraine.  Sinusitis is inflammation and infection in the sinus cavities of the head.  Based on your presentation I believe you most likely have Acute Viral Sinusitis.This is an infection most likely caused by a virus. There is not specific treatment for viral sinusitis other than to help you with the symptoms until the infection runs its course.  You may use an oral decongestant such as Mucinex D or if you have glaucoma or high blood pressure use plain Mucinex. Saline nasal spray help and can safely be used as often as needed for congestion, I have prescribed: Azelastine nasal spray 2 sprays in each nostril twice a day. You can use your Flonase with this. Ok to take your usual migraine medication today.  Some authorities believe that zinc sprays or the use of Echinacea may shorten the course of your symptoms.  Sinus infections are not as easily transmitted as other respiratory infection, however we still recommend that you avoid close contact with loved ones, especially the very young and elderly.  Remember to wash your hands thoroughly throughout the day as this is the number one way to prevent the spread of infection!  I have sent a work note to Doctor, hospital. You can find by going to the Menu on your homepage, scrolling down to the Communications section, and selecting Letters. Let us know if you have any issue locating. Take care and feel better soon!   Home Care: Only take medications as instructed by your medical team. Do not take these medications with alcohol. A steam or ultrasonic humidifier can help congestion.  You can place a towel over your head and breathe in the steam from hot water coming from a faucet. Avoid close contacts especially the very young and the elderly. Cover your mouth when you  cough or sneeze. Always remember to wash your hands.  Get Help Right Away If: You develop worsening fever or sinus pain. You develop a severe head ache or visual changes. Your symptoms persist after you have completed your treatment plan.  Make sure you Understand these instructions. Will watch your condition. Will get help right away if you are not doing well or get worse.   Thank you for choosing an e-visit.  Your e-visit answers were reviewed by a board certified advanced clinical practitioner to complete your personal care plan. Depending upon the condition, your plan could have included both over the counter or prescription medications.  Please review your pharmacy choice. Make sure the pharmacy is open so you can pick up prescription now. If there is a problem, you may contact your provider through [employer] and have the prescription routed to another pharmacy.  Your safety is important to [place]. If you have drug allergies check your prescription carefully.   For the next 24 hours you can use MyChart to ask questions about today's visit, request a non-urgent call back, or ask for a work or school excuse. You will get an email in the next two days asking about your experience. I hope that your e-visit has been valuable and will speed your recovery.

## 2022-08-19 NOTE — Progress Notes (Signed)
Message sent to patient requesting further input regarding current symptoms. Awaiting patient response.  

## 2022-08-26 NOTE — Progress Notes (Signed)
I have spent 5 minutes in review of e-visit questionnaire, review and updating patient chart, medical decision making and response to patient.   Nakyra Bourn Cody Adream Parzych, PA-C    

## 2022-08-27 ENCOUNTER — Ambulatory Visit: Payer: Medicare Other | Admitting: Podiatry

## 2022-08-27 ENCOUNTER — Other Ambulatory Visit: Payer: Self-pay | Admitting: Adult Health

## 2022-08-27 DIAGNOSIS — G2581 Restless legs syndrome: Secondary | ICD-10-CM

## 2022-08-28 ENCOUNTER — Other Ambulatory Visit: Payer: Self-pay | Admitting: Nurse Practitioner

## 2022-09-03 ENCOUNTER — Encounter: Payer: Self-pay | Admitting: Nurse Practitioner

## 2022-09-03 ENCOUNTER — Ambulatory Visit (INDEPENDENT_AMBULATORY_CARE_PROVIDER_SITE_OTHER): Payer: Medicare Other | Admitting: Nurse Practitioner

## 2022-09-03 VITALS — BP 128/84 | HR 96 | Temp 97.1°F | Ht 65.0 in | Wt 321.0 lb

## 2022-09-03 DIAGNOSIS — I10 Essential (primary) hypertension: Secondary | ICD-10-CM | POA: Diagnosis not present

## 2022-09-03 DIAGNOSIS — E1142 Type 2 diabetes mellitus with diabetic polyneuropathy: Secondary | ICD-10-CM

## 2022-09-03 DIAGNOSIS — E782 Mixed hyperlipidemia: Secondary | ICD-10-CM | POA: Diagnosis not present

## 2022-09-03 DIAGNOSIS — M542 Cervicalgia: Secondary | ICD-10-CM

## 2022-09-03 DIAGNOSIS — Z23 Encounter for immunization: Secondary | ICD-10-CM

## 2022-09-03 DIAGNOSIS — R739 Hyperglycemia, unspecified: Secondary | ICD-10-CM

## 2022-09-03 DIAGNOSIS — G629 Polyneuropathy, unspecified: Secondary | ICD-10-CM | POA: Diagnosis not present

## 2022-09-03 DIAGNOSIS — E538 Deficiency of other specified B group vitamins: Secondary | ICD-10-CM

## 2022-09-03 DIAGNOSIS — R079 Chest pain, unspecified: Secondary | ICD-10-CM | POA: Diagnosis not present

## 2022-09-03 DIAGNOSIS — G2581 Restless legs syndrome: Secondary | ICD-10-CM

## 2022-09-03 DIAGNOSIS — E559 Vitamin D deficiency, unspecified: Secondary | ICD-10-CM

## 2022-09-03 DIAGNOSIS — R29898 Other symptoms and signs involving the musculoskeletal system: Secondary | ICD-10-CM

## 2022-09-03 NOTE — Progress Notes (Unsigned)
Careteam: Patient Care Team: Lauree Chandler, NP as PCP - General (Geriatric Medicine)  PLACE OF SERVICE:  [place] Directive information Does Patient Have a Medical Advance Directive?: No, Would patient like information on creating a medical advance directive?: Yes (MAU/Ambulatory/Procedural Areas - Information given)  Allergies  Allergen Reactions   Fish Allergy Anaphylaxis   Iodine Anaphylaxis   Sulfa Antibiotics Hives    Chief Complaint  Patient presents with   Acute Visit    Patient c/o pain in chest, neck, arms, and knees. Patient with burning in toes, SOB and fatigue also. Patient would like flu vaccine (will confirm ok due to symptoms)      HPI: Patient is a 67 y.o. female here for an acute visit.   She 'feels like a bus ran over her' this morning.   She has some stiffness and pain in her neck, some pain across the chest that is chronic 4/10, but now for the last week seems to be 10/10 that radiates from her ribcage to her L arm and up into her jaw.   Knee pain that is chronic she has arthritis.  Tingling/numbness in the toes, chronic. Plantar fasciitis and bone spurs that she sees a podiatrist for.  She takes diclofenac 75 mg twice a day and tylenol as needed. Protonix she takes regularly.   She has gained 10 lbs she feels in the last week. She can't stop eating. A little more thirsty than normal. Urinates about once an hour, frequent but no change.   Denies swelling. Constant dull throb in her head, for a couple of weeks.   Review of Systems:  ROS***  Past Medical History:  Diagnosis Date   Anemia    Angina at rest    Anxiety    Arthritis    Asthma    B12 deficiency    Back pain    Chest pain    Constipation    Depression    Edema of both lower extremities    Fatigue    Gallbladder problem    GERD (gastroesophageal reflux disease)    Glaucoma    History of stomach ulcers    History of stomach ulcers    Hyperlipidemia     Hypertension    Joint pain    Migraines    Multiple food allergies    Multiple food allergies    Obesity    Osteoarthritis    Pre-diabetes    Sleep apnea    SOB (shortness of breath)    Past Surgical History:  Procedure Laterality Date   ABDOMINAL HYSTERECTOMY     APPENDECTOMY     CHOLECYSTECTOMY     GALLBLADDER SURGERY  1989   Social History:   reports that she quit smoking about 16 years ago. Her smoking use included cigarettes. She smoked an average of 3 packs per day. She has never used smokeless tobacco. She reports that she does not drink alcohol and does not use drugs.  Family History  Problem Relation Age of Onset   Stroke Mother    Hyperlipidemia Mother    Hypertension Mother    Heart disease Mother        3 CABG, stents placements.    Heart attack Mother        late 79s   Diabetes Mother    High Cholesterol Mother    Kidney disease Mother    Depression Mother    Sleep apnea Mother    Alcoholism Mother  Obesity Mother    Cancer Father    Breast cancer Sister    Breast cancer Sister    Breast cancer Sister    Breast cancer Maternal Aunt    Diabetes Other    Heart disease Other     Medications: Patient's Medications  New Prescriptions   No medications on file  Previous Medications   ACETAMINOPHEN (TYLENOL) 650 MG CR TABLET    Take 650 mg by mouth every 6 (six) hours as needed for pain.   ALBUTEROL (VENTOLIN HFA) 108 (90 BASE) MCG/ACT INHALER    Inhale 2 puffs into the lungs every 6 (six) hours as needed for wheezing or shortness of breath.   AMLODIPINE (NORVASC) 10 MG TABLET    Take 1 tablet by mouth daily.   ASPIRIN EC 81 MG TABLET    Take 1 tablet (81 mg total) by mouth daily. Swallow whole.   ATORVASTATIN (LIPITOR) 10 MG TABLET    Take 1 tablet by mouth daily.   AZELASTINE (ASTELIN) 0.1 % NASAL SPRAY    Place 1 spray into both nostrils 2 (two) times daily. Use in each nostril as directed   BLOOD GLUCOSE MONITORING SUPPL (ONETOUCH VERIO) W/DEVICE  KIT    Use to test blood sugar twice daily. Dx: E11.69   DICLOFENAC (VOLTAREN) 75 MG EC TABLET    Take 1 tablet by mouth twice daily.   FLUTICASONE (FLONASE) 50 MCG/ACT NASAL SPRAY    Place 2 sprays into both nostrils daily.   GABAPENTIN (NEURONTIN) 100 MG CAPSULE    Take 1 capsule by mouth at bedtime.   GLUCOSE BLOOD (ONETOUCH VERIO) TEST STRIP    Use to test blood sugar twice daily. Dx: E11.69   LANCETS (ONETOUCH ULTRASOFT) LANCETS    Use to test blood sugar twice daily. Dx: E11.69   MELATONIN 10 MG TABS    Take 10 mg by mouth at bedtime as needed (Sleep).   METFORMIN (GLUCOPHAGE) 500 MG TABLET    Take 1 tablet (500 mg total) by mouth daily with breakfast.   PANTOPRAZOLE (PROTONIX) 40 MG TABLET    Take 1 tablet by mouth in the morning.   SODIUM CHLORIDE (OCEAN) 0.65 % SOLN NASAL SPRAY    Place 1 spray into both nostrils daily as needed for congestion.   SUMATRIPTAN (IMITREX) 25 MG TABLET    Take one tablet by mouth as needed for Headache. May repeat in 2 hours if headache persists or recurs. MAX 3 tablets/24 hours   VENLAFAXINE XR (EFFEXOR-XR) 75 MG 24 HR CAPSULE    Take 1 capsule by mouth daily with breakfast.   VITAMIN D, ERGOCALCIFEROL, (DRISDOL) 1.25 MG (50000 UNIT) CAPS CAPSULE    Take 1 capsule by mouth weekly.  Modified Medications   No medications on file  Discontinued Medications   No medications on file    Physical Exam:  Vitals:   09/03/22 0843  BP: 128/84  Pulse: 96  Temp: (!) 97.1 F (36.2 C)  TempSrc: Temporal  SpO2: 98%  Weight: (!) 321 lb (145.6 kg)  Height: '5\' 5"'$  (1.651 m)   Body mass index is 53.42 kg/m. Wt Readings from Last 3 Encounters:  09/03/22 (!) 321 lb (145.6 kg)  04/13/22 (!) 311 lb (141.1 kg)  02/09/22 (!) 312 lb (141.5 kg)    Physical Exam***  Labs reviewed: Basic Metabolic Panel: Recent Labs    09/16/21 0904  NA 143  K 4.7  CL 103  CO2 24  GLUCOSE 90  BUN 17  CREATININE 0.97  CALCIUM 9.1  TSH 3.000   Liver Function  Tests: Recent Labs    09/16/21 0904  AST 14  ALT 10  ALKPHOS 80  BILITOT 0.4  PROT 6.7  ALBUMIN 4.0   No results for input(s): "LIPASE", "AMYLASE" in the last 8760 hours. No results for input(s): "AMMONIA" in the last 8760 hours. CBC: Recent Labs    09/16/21 0904  WBC 6.0  NEUTROABS 3.5  HGB 12.9  HCT 41.7  MCV 84  PLT 274   Lipid Panel: Recent Labs    09/16/21 0904  CHOL 180  HDL 51  LDLCALC 111*  TRIG 101  CHOLHDL 3.5   TSH: Recent Labs    09/16/21 0904  TSH 3.000   A1C: Lab Results  Component Value Date   HGBA1C 5.8 (H) 09/16/2021     Assessment/Plan 1. Need for influenza vaccination Pt received flu vaccine today. - Flu Vaccine QUAD High Dose(Fluad)  2. Chest pain, unspecified type No changes on EKG. Pain is reproducible with palpation.  - EKG 12-Lead  3. Hyperglycemia Pt reports eating a lot of starches, animal crackers, rice and bread. Discussed diet changes with patient, she is unsure of making changes at this time. She is unable to increase physical activity at this time. Continue  Metformin.  4. RLS (restless legs syndrome) Continue gabapentin, effexor   5. Type 2 diabetes mellitus with diabetic polyneuropathy, without long-term current use of insulin (HCC) Continue metformin. Discussed adjusting diet and patient will consider dietary changes.  - Hemoglobin A1c - Microalbumin/Creatinine Ratio, Urine - Flu Vaccine QUAD High Dose(Fluad)  6. Neuropathy Patient has a history of trauma to her spine from many years ago. Check imaging as she has new nerve/muscle pain of LUE.  - COMPLETE METABOLIC PANEL WITH GFR - CBC with Differential/Platelet - DG Lumbar Spine Complete; Future - DG Cervical Spine Complete; Future  7. Hypertension, unspecified type Continue amlodipine, BP stable today. Follow low-sodium diet. - COMPLETE METABOLIC PANEL WITH GFR - CBC with Differential/Platelet  8. Mixed hyperlipidemia Discussed dietary modifications  with patient. Continue atorvastatin.  - Lipid panel - COMPLETE METABOLIC PANEL WITH GFR  9. Morbid obesity ([place]) Discussed weight loss and importance of weight loss with positive effect on other chronic conditions. Patient agreeable to listen. Unsure about increasing physical activity at this time or making diet changes.   10. Neck pain Hx of trauma many years in the past. Check imaging - DG Cervical Spine Complete; Future  11. Weakness of left leg Hx of trauma from many years in the past. Check imaging. - DG Lumbar Spine Complete; Future  12. B12 deficiency Low on last labs, check today. - Vitamin B12  13. Vitamin D deficiency Pt on low-end, reports fatigue. Check today. - Vitamin D, 25-hydroxy    No follow-ups on file.: ***  Student- Kamna O'Berry ACPCNP-S  I personally was present during the history, physical exam and medical decision-making activities of this service and have verified that the service and findings are accurately documented in the student's note Tinesha Siegrist K. [place], [facility] [phone]

## 2022-09-04 LAB — COMPLETE METABOLIC PANEL WITH GFR
AG Ratio: 1.3 (calc) (ref 1.0–2.5)
ALT: 12 U/L (ref 6–29)
AST: 15 U/L (ref 10–35)
Albumin: 4.1 g/dL (ref 3.6–5.1)
Alkaline phosphatase (APISO): 71 U/L (ref 37–153)
BUN: 9 mg/dL (ref 7–25)
CO2: 30 mmol/L (ref 20–32)
Calcium: 9.3 mg/dL (ref 8.6–10.4)
Chloride: 101 mmol/L (ref 98–110)
Creat: 0.71 mg/dL (ref 0.50–1.05)
Globulin: 3.1 g/dL (calc) (ref 1.9–3.7)
Glucose, Bld: 86 mg/dL (ref 65–139)
Potassium: 4.4 mmol/L (ref 3.5–5.3)
Sodium: 142 mmol/L (ref 135–146)
Total Bilirubin: 0.3 mg/dL (ref 0.2–1.2)
Total Protein: 7.2 g/dL (ref 6.1–8.1)
eGFR: 93 mL/min/{1.73_m2} (ref >=60)

## 2022-09-04 LAB — CBC WITH DIFFERENTIAL/PLATELET
Absolute Monocytes: 562 cells/uL (ref 200–950)
Basophils Absolute: 30 cells/uL (ref 0–200)
Basophils Relative: 0.4 %
Eosinophils Absolute: 104 cells/uL (ref 15–500)
Eosinophils Relative: 1.4 %
HCT: 41.9 % (ref 35.0–45.0)
Hemoglobin: 13.3 g/dL (ref 11.7–15.5)
Lymphs Abs: 1754 cells/uL (ref 850–3900)
MCH: 26.1 pg — ABNORMAL LOW (ref 27.0–33.0)
MCHC: 31.7 g/dL — ABNORMAL LOW (ref 32.0–36.0)
MCV: 82.3 fL (ref 80.0–100.0)
MPV: 10.3 fL (ref 7.5–12.5)
Monocytes Relative: 7.6 %
Neutro Abs: 4951 cells/uL (ref 1500–7800)
Neutrophils Relative %: 66.9 %
Platelets: 300 10*3/uL (ref 140–400)
RBC: 5.09 10*6/uL (ref 3.80–5.10)
RDW: 13.1 % (ref 11.0–15.0)
Total Lymphocyte: 23.7 %
WBC: 7.4 10*3/uL (ref 3.8–10.8)

## 2022-09-04 LAB — HEMOGLOBIN A1C
Hgb A1c MFr Bld: 5.9 % of total Hgb — ABNORMAL HIGH (ref <5.7)
Mean Plasma Glucose: 123 mg/dL
eAG (mmol/L): 6.8 mmol/L

## 2022-09-04 LAB — MICROALBUMIN / CREATININE URINE RATIO
Creatinine, Urine: 152 mg/dL (ref 20–275)
Microalb Creat Ratio: 4 mcg/mg creat (ref <30)
Microalb, Ur: 0.6 mg/dL

## 2022-09-04 LAB — VITAMIN D 25 HYDROXY (VIT D DEFICIENCY, FRACTURES): Vit D, 25-Hydroxy: 70 ng/mL (ref 30–100)

## 2022-09-04 LAB — LIPID PANEL
Cholesterol: 210 mg/dL — ABNORMAL HIGH (ref <200)
HDL: 57 mg/dL (ref >=50)
LDL Cholesterol (Calc): 124 mg/dL (calc) — ABNORMAL HIGH
Non-HDL Cholesterol (Calc): 153 mg/dL (calc) — ABNORMAL HIGH (ref <130)
Total CHOL/HDL Ratio: 3.7 (calc) (ref <5.0)
Triglycerides: 176 mg/dL — ABNORMAL HIGH (ref <150)

## 2022-09-04 LAB — VITAMIN B12: Vitamin B-12: 325 pg/mL (ref 200–1100)

## 2022-09-06 ENCOUNTER — Other Ambulatory Visit: Payer: Self-pay | Admitting: *Deleted

## 2022-09-06 ENCOUNTER — Other Ambulatory Visit: Payer: Self-pay

## 2022-09-06 DIAGNOSIS — M159 Polyosteoarthritis, unspecified: Secondary | ICD-10-CM

## 2022-09-06 DIAGNOSIS — R7303 Prediabetes: Secondary | ICD-10-CM

## 2022-09-06 MED ORDER — PANTOPRAZOLE SODIUM 40 MG PO TBEC: 40.0000 mg | DELAYED_RELEASE_TABLET | Freq: Every morning | ORAL | 1 refills | Status: DC

## 2022-09-06 MED ORDER — AMLODIPINE BESYLATE 10 MG PO TABS: 10.0000 mg | ORAL_TABLET | Freq: Every day | ORAL | 1 refills | Status: DC

## 2022-09-06 MED ORDER — METFORMIN HCL 500 MG PO TABS: 500.0000 mg | ORAL_TABLET | Freq: Every day | ORAL | 1 refills | Status: DC

## 2022-09-06 MED ORDER — DICLOFENAC SODIUM 75 MG PO TBEC: 75.0000 mg | DELAYED_RELEASE_TABLET | Freq: Two times a day (BID) | ORAL | 1 refills | Status: DC

## 2022-09-06 MED ORDER — VENLAFAXINE HCL ER 75 MG PO CP24: 75.0000 mg | ORAL_CAPSULE | Freq: Every day | ORAL | 1 refills | Status: DC

## 2022-09-06 MED ORDER — ATORVASTATIN CALCIUM 20 MG PO TABS: 20.0000 mg | ORAL_TABLET | Freq: Every day | ORAL | 1 refills | Status: DC

## 2022-09-06 NOTE — Telephone Encounter (Signed)
[facility] requested refills.

## 2022-09-06 NOTE — Addendum Note (Signed)
Addended by: Rafael Bihari A on: 09/06/2022 03:54 PM   Modules accepted: Orders

## 2022-09-09 ENCOUNTER — Other Ambulatory Visit: Payer: Self-pay

## 2022-09-09 ENCOUNTER — Encounter: Payer: Self-pay | Admitting: Physical Therapy

## 2022-09-09 ENCOUNTER — Ambulatory Visit: Payer: Medicare Other | Attending: Podiatry | Admitting: Physical Therapy

## 2022-09-09 DIAGNOSIS — M79604 Pain in right leg: Secondary | ICD-10-CM | POA: Insufficient documentation

## 2022-09-09 DIAGNOSIS — M79605 Pain in left leg: Secondary | ICD-10-CM | POA: Insufficient documentation

## 2022-09-09 DIAGNOSIS — R2689 Other abnormalities of gait and mobility: Secondary | ICD-10-CM | POA: Insufficient documentation

## 2022-09-09 DIAGNOSIS — M6281 Muscle weakness (generalized): Secondary | ICD-10-CM | POA: Insufficient documentation

## 2022-09-09 NOTE — Therapy (Deleted)
OUTPATIENT PHYSICAL THERAPY LOWER EXTREMITY EVALUATION  Patient Name: Kristi [name] MRN: [id number] DOB:[birth year], 67 y.o., female Today's Date: 09/09/2022    Past Medical History:  Diagnosis Date   Anemia    Angina at rest    Anxiety    Arthritis    Asthma    B12 deficiency    Back pain    Chest pain    Constipation    Depression    Edema of both lower extremities    Fatigue    Gallbladder problem    GERD (gastroesophageal reflux disease)    Glaucoma    History of stomach ulcers    History of stomach ulcers    Hyperlipidemia    Hypertension    Joint pain    Migraines    Multiple food allergies    Multiple food allergies    Obesity    Osteoarthritis    Pre-diabetes    Sleep apnea    SOB (shortness of breath)    Past Surgical History:  Procedure Laterality Date   ABDOMINAL HYSTERECTOMY     [place]   Patient Active Problem List   Diagnosis Date Noted   Migraine with aura and without status migrainosus, not intractable 10/05/2021   History of stomach ulcers 09/16/2021   Morbid obesity ([place]) 05/05/2021   Dyspnea 05/05/2021   Chest pain 02/24/2017   Atypical chest pain 05/10/2016   HTN (hypertension) 05/10/2016   Hypertensive emergency 05/10/2016   Anxiety 05/10/2016   Gastroesophageal reflux disease with esophagitis     PCP: Lauree Chandler, NP  REFERRING PROVIDER: Trula Slade, DPM  THERAPY DIAG:  No diagnosis found.  REFERRING DIAG: Achilles tendinitis of left lower extremity [M76.62]   Rationale for Evaluation and Treatment:  Rehabilitation  SUBJECTIVE:  PERTINENT PAST HISTORY:  Anxiety, chest pain, dyspnea        PRECAUTIONS: None  WEIGHT BEARING RESTRICTIONS No  FALLS:  Has patient fallen in last 6 months? {yes/no:20286}, Number of falls: ***  MOI/History of condition:  Onset date: ***  SUBJECTIVE STATEMENT  Kristi [name] is a 67 y.o. female who presents to  clinic with chief complaint of ***.  ***  From referring provider:   "Musculoskeletal: There is tenderness palpation along the distal portion of the Achilles tendons along the mid substance.  There is no deficit noted [place] Silos test is negative.  There is also discomfort on the plantar aspect of the heel and insertion of plantar fascia.  There is no pain with lateral compression of calcaneus.  No edema, erythema.  The dorsal aspect of the left midfoot is a prominent bone spur with localized edema and mild tenderness palpation of this area.  Flexor, extensor tendons appear to be intact.  MMT 5/5.   -Treatment options discussed including all alternatives, risks, and complications -Etiology of symptoms were discussed -X-rays obtained reviewed.  3 views of the foot and 2 views of the ankle were obtained.  Arthritic changes present of the foot.  There is no evidence of acute fracture. -Steroid injection performed to left fracture.  See procedure note below. -Night splint was dispensed to help stretch and rehab the Achilles to, plantar fascia. -She is already on Voltaren we will continue with this. -Discussed stretching, icing daily.  We discussed shoe modifications and good arch supports as well."   Red flags:  {has/denies:26543} {kerredflag:26542}  Pain:  Are you having pain? {yes/no:20286} Pain location: *** NPRS  scale:  {NUMBERS; 0-10:5044}/10 to {NUMBERS; 0-10:5044}/10 Aggravating factors: *** Relieving factors: *** Pain description: {PAIN DESCRIPTION:21022940} Stage: {Desc; acute/subacute/chronic:13799} Stability: {kerbetterworse:26715} 24 hour pattern: ***   Occupation: ***  Assistive Device: ***  Hand Dominance: ***  Patient Goals/Specific Activities: ***   OBJECTIVE:   DIAGNOSTIC FINDINGS:  ***   GENERAL OBSERVATION/GAIT:   Slow antalgic gait with WBOS  SENSATION:  Light touch: {intact/deficits:24005}  PALPATION: ***  MUSCLE LENGTH: Hamstrings: Right  {kerminsig:27227} restriction; Left {kerminsig:27227} restriction ASLR: Right {keraslr:27228}; Left {keraslr:27228} Marcello Moores test: Right {kerminsig:27227} restriction; Left {kerminsig:27227} restriction Ely's test: Right {kerminsig:27227} restriction; Left {kerminsig:27227} restriction  LE MMT:  MMT Right 09/09/2022 Left 09/09/2022  Hip flexion (L2, L3) *** ***  Knee extension (L3) *** ***  Knee flexion *** ***  Hip abduction *** ***  Hip extension *** ***  Hip external rotation    Hip internal rotation    Hip adduction    Ankle dorsiflexion (L4)    Ankle plantarflexion (S1)    Ankle inversion    Ankle eversion    Great Toe ext (L5)    Grossly     (Blank rows = not tested, score listed is out of 5 possible points.  N = WNL, D = diminished, C = clear for gross weakness with myotome testing, * = concordant pain with testing)  LE ROM:  ROM Right 09/09/2022 Left 09/09/2022  Hip flexion    Hip extension    Hip abduction    Hip adduction    Hip internal rotation    Hip external rotation    Knee flexion    Great toe extension *** ***  Ankle dorsiflexion *** ***  Ankle plantarflexion *** ***  Ankle inversion *** ***  Ankle eversion *** ***   (Blank rows = not tested, N = WNL, * = concordant pain with testing)  Functional Tests  Eval (09/09/2022)    Progressive balance screen (highest level completed for >/= 10''):  Feet together: ***'' Semi Tandem: R in rear ***'', L in rear ***'' Tandem: R in rear ***'', L in rear ***'' SLS: R ***'', L ***''                                                           LOWER EXTREMITY SPECIAL TESTS:  Knee special tests: {KNEE SPECIAL TESTS:26240}   PATIENT SURVEYS:  {rehab surveys:24030}   TODAY'S TREATMENT: ***   PATIENT EDUCATION:  POC, diagnosis, prognosis, HEP, and outcome measures.  Pt educated via explanation, demonstration, and handout (HEP).  Pt confirms understanding verbally.   HOME EXERCISE  PROGRAM: ***  Treatment priorities   Eval (09/09/2022)                                                  ASSESSMENT:  CLINICAL IMPRESSION: Kristi [name] is a 67 y.o. female who presents to clinic with signs and sxs consistent with ***.    OBJECTIVE IMPAIRMENTS: Pain, ***  ACTIVITY LIMITATIONS: ***  PERSONAL FACTORS: See medical history and pertinent history   REHAB POTENTIAL: {rehabpotential:25112}  CLINICAL DECISION MAKING: {clinical decision making:25114}  EVALUATION COMPLEXITY: {Evaluation complexity:25115}   GOALS:   SHORT TERM GOALS: Target date:  10/07/2022  [place] will be >75% HEP compliant to improve carryover between sessions and facilitate independent management of condition  Evaluation (09/09/2022): ongoing Goal status: INITIAL   LONG TERM GOALS: Target date: 11/04/2022  ***   2.  ***   3.  ***   4.  ***   5.  ***   6.  ***   PLAN: PT FREQUENCY: 1-2x/week  PT DURATION: 8 weeks (Ending 11/04/2022)  PLANNED INTERVENTIONS: Therapeutic exercises, Aquatic therapy, Therapeutic activity, Neuro Muscular re-education, Gait training, Patient/Family education, Joint mobilization, Dry Needling, Electrical stimulation, Spinal mobilization and/or manipulation, Moist heat, Taping, Vasopneumatic device, Ionotophoresis '4mg'$ /ml Dexamethasone, and Manual therapy  PLAN FOR NEXT SESSION: ***   Shearon Balo PT, DPT 09/09/2022, 7:01 AM

## 2022-09-13 ENCOUNTER — Ambulatory Visit: Payer: Medicare Other | Admitting: Nurse Practitioner

## 2022-09-16 NOTE — Therapy (Signed)
Note created in error.

## 2022-10-13 ENCOUNTER — Encounter: Payer: Self-pay | Admitting: Internal Medicine

## 2022-10-13 ENCOUNTER — Other Ambulatory Visit (INDEPENDENT_AMBULATORY_CARE_PROVIDER_SITE_OTHER): Payer: Medicare Other

## 2022-10-13 ENCOUNTER — Ambulatory Visit: Payer: Medicare Other | Admitting: Internal Medicine

## 2022-10-13 VITALS — BP 130/80 | HR 88 | Ht 65.0 in | Wt 323.0 lb

## 2022-10-13 DIAGNOSIS — K5900 Constipation, unspecified: Secondary | ICD-10-CM

## 2022-10-13 DIAGNOSIS — R110 Nausea: Secondary | ICD-10-CM

## 2022-10-13 DIAGNOSIS — Z1211 Encounter for screening for malignant neoplasm of colon: Secondary | ICD-10-CM | POA: Diagnosis not present

## 2022-10-13 LAB — HIGH SENSITIVITY CRP: CRP, High Sensitivity: 6.11 mg/L — ABNORMAL HIGH (ref 0.000–5.000)

## 2022-10-13 MED ORDER — NA SULFATE-K SULFATE-MG SULF 17.5-3.13-1.6 GM/177ML PO SOLN: ORAL | 0 refills | Status: DC

## 2022-10-13 MED ORDER — ONDANSETRON HCL 4 MG PO TABS: 4.0000 mg | ORAL_TABLET | Freq: Three times a day (TID) | ORAL | 1 refills | Status: DC | PRN

## 2022-10-13 NOTE — Patient Instructions (Addendum)
You have been scheduled for an endoscopy and colonoscopy. Please follow the written instructions given to you at your visit today. Please pick up your prep supplies at the pharmacy within the next 1-3 days. If you use inhalers (even only as needed), please bring them with you on the day of your procedure.   Your provider has requested that you go to the basement level for lab work before leaving today. Press "B" on the elevator. The lab is located at the first door on the left as you exit the elevator.   We have sent the following medications to your pharmacy for you to pick up at your convenience: Suprep, Zofran  _______________________________________________________  If your blood pressure at your visit was 140/90 or greater, please contact your primary care physician to follow up on this.  _______________________________________________________  If you are age 68 or older, your body mass index should be between 23-30. Your Body mass index is 53.75 kg/m. If this is out of the aforementioned range listed, please consider follow up with your Primary Care Provider.  If you are age 68 or younger, your body mass index should be between 19-25. Your Body mass index is 53.75 kg/m. If this is out of the aformentioned range listed, please consider follow up with your Primary Care Provider.   ________________________________________________________  The [facility] providers would like to encourage you to use [facility] to communicate with providers for non-urgent requests or questions.  Due to long hold times on the telephone, sending your provider a message by [facility] may be a faster and more efficient way to get a response.  Please allow 48 business hours for a response.  Please remember that this is for non-urgent requests.  _______________________________________________________   Due to recent changes in healthcare laws, you may see the results of your imaging and laboratory studies on MyChart  before your provider has had a chance to review them.  We understand that in some cases there may be results that are confusing or concerning to you. Not all laboratory results come back in the same time frame and the provider may be waiting for multiple results in order to interpret others.  Please give [place] 48 hours in order for your provider to thoroughly review all the results before contacting the office for clarification of your results.    Drink 8 cups of water a day and walk 30 minutes a day.  Please purchase the following medications over the counter and take as directed: Miralax: Take as  directed up to 3 times a day to achieve regular bowel movements   Thank you for entrusting me with your care and for choosing [facility], Dr. Eulah Pont

## 2022-10-13 NOTE — Progress Notes (Signed)
Chief Complaint: Colon cancer screening, alternating diarrhea and constipation  HPI : 67 year old female with history of asthma, GERD, migraines, obesity presents for follow up of colon cancer screening and alternating bowel habits  Interval History: She has stayed nauseous all the time for the last year. Food helps with her nausea. She hasn't tried any nausea medications. She always eats the same foods, but there are days when she cannot pass a BM. She notices that sometimes there is loose stool trying to get past solid stool. She tried to take fiber supplements but this caused her to be nauseous. She does not laxative medications. The last week or so she has had dizziness. She has ab pain very rarely, usually before she has a BM. Denies alcohol use. She does use some THC products, which helps her relax. Endorses use occasional marijuana. She uses Aleve PRN for arthritis pain. Denies chest burning or regurgitation. Endorses burping. She has a history of a hiatal hernia. Denies blood in the stools.   Wt Readings from Last 3 Encounters:  10/13/22 (!) 323 lb (146.5 kg)  09/03/22 (!) 321 lb (145.6 kg)  04/13/22 (!) 311 lb (141.1 kg)   Current Outpatient Medications  Medication Sig Dispense Refill   acetaminophen (TYLENOL) 650 MG CR tablet Take 650 mg by mouth every 6 (six) hours as needed for pain.     albuterol (VENTOLIN HFA) 108 (90 Base) MCG/ACT inhaler Inhale 2 puffs into the lungs every 6 (six) hours as needed for wheezing or shortness of breath. 18 g 0   amLODipine (NORVASC) 10 MG tablet Take 1 tablet (10 mg total) by mouth daily. 90 tablet 1   aspirin EC 81 MG tablet Take 1 tablet (81 mg total) by mouth daily. Swallow whole. 90 tablet 3   atorvastatin (LIPITOR) 20 MG tablet Take 1 tablet (20 mg total) by mouth daily. 90 tablet 1   Blood Glucose Monitoring Suppl (ONETOUCH VERIO) w/Device KIT Use to test blood sugar twice daily. Dx: E11.69 1 kit 0   cyanocobalamin (VITAMIN B12) 1000 MCG  tablet Take 1,000 mcg by mouth daily.     diclofenac (VOLTAREN) 75 MG EC tablet Take 1 tablet (75 mg total) by mouth 2 (two) times daily. 180 tablet 1   fluticasone (FLONASE) 50 MCG/ACT nasal spray Place 2 sprays into both nostrils daily. 16 g 0   gabapentin (NEURONTIN) 100 MG capsule Take 1 capsule by mouth at bedtime. 30 capsule 2   glucose blood (ONETOUCH VERIO) test strip Use to test blood sugar twice daily. Dx: E11.69 200 each 3   Lancets (ONETOUCH ULTRASOFT) lancets Use to test blood sugar twice daily. Dx: E11.69 200 each 3   Melatonin 10 MG TABS Take 10 mg by mouth at bedtime as needed (Sleep).     metFORMIN (GLUCOPHAGE) 500 MG tablet Take 1 tablet (500 mg total) by mouth daily with breakfast. 90 tablet 1   pantoprazole (PROTONIX) 40 MG tablet Take 1 tablet (40 mg total) by mouth every morning. 90 tablet 1   sodium chloride (OCEAN) 0.65 % SOLN nasal spray Place 1 spray into both nostrils daily as needed for congestion.     SUMAtriptan (IMITREX) 25 MG tablet Take one tablet by mouth as needed for Headache. May repeat in 2 hours if headache persists or recurs. MAX 3 tablets/24 hours 10 tablet 3   venlafaxine XR (EFFEXOR-XR) 75 MG 24 hr capsule Take 1 capsule (75 mg total) by mouth daily with breakfast. 90 capsule 1  Vitamin D, Ergocalciferol, (DRISDOL) 1.25 MG (50000 UNIT) CAPS capsule Take 1 capsule by mouth weekly. 12 capsule 3   No current facility-administered medications for this visit.   Physical Exam: BP 130/80   Pulse 88   Ht 5\' 5"  (1.651 m)   Wt (!) 323 lb (146.5 kg)   BMI 53.75 kg/m  Constitutional: Pleasant,well-developed, female in no acute distress. HEENT: Normocephalic and atraumatic. Conjunctivae are normal. No scleral icterus. Cardiovascular: Normal rate Pulmonary/chest: Effort normal Abdominal: Soft, nondistended Extremities: No edema Neurological: Alert and oriented to person place and time. Skin: Skin is warm and dry. No rashes noted. Psychiatric: Normal mood  and affect. Behavior is normal.  Labs 03/2021: CBC unremarkable  Labs 06/2021: CMP unremarkable. HCV AB negative.  Labs 09/2021: Vit B12 low at 213. Vit D nml. TSH nml. Folate nml.   Labs 11/2021: FOBT negative.  Labs 09/2022: CBC and CMP unremarkable. Vit B12 nml. Vit D nml. HbA1C 5.9%.   TTE 05/26/21: 1. Left ventricular ejection fraction, by estimation, is 60 to 65%. The left ventricle has normal function. The left ventricle has no regional wall motion abnormalities. There is mild left ventricular hypertrophy.  Left ventricular diastolic parameters are consistent with Grade I diastolic dysfunction (impaired relaxation).   2. Right ventricular systolic function is normal. The right ventricular size is normal. Tricuspid regurgitation signal is inadequate for assessing PA pressure.   3. The mitral valve is normal in structure. Trivial mitral valve  regurgitation. No evidence of mitral stenosis.   4. The aortic valve is tricuspid. There is mild calcification of the aortic valve. Aortic valve regurgitation is not visualized. No aortic stenosis is present.   5. Aortic dilatation noted. There is mild dilatation of the ascending aorta, measuring 40 mm.   6. The inferior vena cava is normal in size with greater than 50% respiratory variability, suggesting right atrial pressure of 3 mmHg.   ASSESSMENT AND PLAN: Colon cancer screening Alternating constipation and diarrhea Nausea History of H pylori Patient presents with constipation as well as what sounds like overflow diarrhea.  She was previously recommended for an EGD and colonoscopy but has been unable to get these procedures done due to difficulty getting the co-pay together for the procedures.  Will plan for a lab workup and start the patient on some antinausea as well as constipation therapies to see if this helps with her symptoms.  She has previously not been able to tolerate a fiber supplement but we will have her take a daily dose of  MiraLAX.  Will also go ahead and get her scheduled for EGD and colonoscopy procedure in the hospital since her BMI is over 50.  Her nausea could be related to underlying constipation but would also want to rule out alternative etiologies.  Patient does also use marijuana on occasion so this could be a contributor to her nausea as well. - Check TTG IgA, IgA, CRP - Zofran 4 mg TID PRN - Drink 8 cups of water per day and walk 30 min per day - Start a daily dose of Miralax - Consider marijuana cessation - EGD/colonoscopy [facility] with 2-day prep with BMI >50 on 5/16  Kristi Pont, MD  I spent 43 minutes of time, including in depth chart review, independent review of results as outlined above, communicating results with the patient directly, face-to-face time with the patient, coordinating care, ordering studies and medications as appropriate, and documentation.

## 2022-10-14 LAB — TISSUE TRANSGLUTAMINASE, IGA: (tTG) Ab, IgA: <1 U/mL

## 2022-10-14 LAB — IGG: IgG (Immunoglobin G), Serum: 1021 mg/dL (ref 600–1540)

## 2022-11-09 ENCOUNTER — Other Ambulatory Visit: Payer: Self-pay | Admitting: Nurse Practitioner

## 2022-11-09 DIAGNOSIS — G2581 Restless legs syndrome: Secondary | ICD-10-CM

## 2022-11-11 ENCOUNTER — Encounter (HOSPITAL_COMMUNITY): Payer: Self-pay | Admitting: Internal Medicine

## 2022-11-11 NOTE — Progress Notes (Signed)
Attempted to obtain medical history via telephone, unable to reach at this time. HIPAA compliant voicemail message left requesting return call to pre surgical testing department. 

## 2022-11-14 ENCOUNTER — Other Ambulatory Visit: Payer: Self-pay | Admitting: Nurse Practitioner

## 2022-11-14 DIAGNOSIS — M159 Polyosteoarthritis, unspecified: Secondary | ICD-10-CM

## 2022-11-15 ENCOUNTER — Encounter: Payer: Self-pay | Admitting: Internal Medicine

## 2022-11-18 ENCOUNTER — Ambulatory Visit (HOSPITAL_COMMUNITY): Admission: RE | Admit: 2022-11-18 | Payer: Medicare Other | Source: Home / Self Care | Admitting: Internal Medicine

## 2022-11-18 ENCOUNTER — Encounter (HOSPITAL_COMMUNITY): Admission: RE | Payer: Self-pay | Source: Home / Self Care

## 2022-11-18 SURGERY — ESOPHAGOGASTRODUODENOSCOPY (EGD) WITH PROPOFOL
Anesthesia: Monitor Anesthesia Care

## 2022-11-26 ENCOUNTER — Other Ambulatory Visit: Payer: Self-pay | Admitting: Nurse Practitioner

## 2022-11-26 DIAGNOSIS — R7303 Prediabetes: Secondary | ICD-10-CM

## 2022-11-26 NOTE — Telephone Encounter (Signed)
Pharmacy requested

## 2022-12-01 ENCOUNTER — Telehealth: Payer: Medicare Other | Admitting: Physician Assistant

## 2022-12-01 DIAGNOSIS — H538 Other visual disturbances: Secondary | ICD-10-CM

## 2022-12-01 DIAGNOSIS — R519 Headache, unspecified: Secondary | ICD-10-CM

## 2022-12-01 NOTE — Progress Notes (Signed)
Because of vision changes and substantial eye pain with symptoms only starting in the past day, I feel your condition warrants further evaluation and I recommend that you be seen in a face to face visit.   NOTE: There will be NO CHARGE for this eVisit   If you are having a true medical emergency please call 911.      For an urgent face to face visit, [facility] has eight urgent care centers for your convenience:   NEW!! [facility] at [facility] Get Driving Directions [phone] [address] [place], [address]    [facility] Center at [facility] Get Driving Directions [phone] [address] [place], [place] [address]   [facility] Center [facility]) Get Driving Directions [phone] [address] [place], [place] [address]  [facility] Health Urgent Care Center [facility]) Get Driving Directions [phone] [address] [place],  [place]  [address]  [facility] Health Urgent Care Center [facility] - at [employer]  [phone] [address] [place],  [place] [address]   [facility] Health Urgent Care at [facility] Get Driving Directions [phone] [address] [place], [place] [address]   [facility] Health Urgent Care at [facility] Get Driving Directions  [phone] [address] [place], [place] [address]   [facility] Health Urgent Care at [facility] Directions [phone] [address] [place], [place] [address]  Your MyChart E-visit questionnaire answers were reviewed by a board certified advanced clinical practitioner to complete your personal care plan based on your specific symptoms.  Thank you for using e-Visits.

## 2022-12-06 ENCOUNTER — Ambulatory Visit: Payer: Medicare Other | Admitting: Nurse Practitioner

## 2023-01-21 ENCOUNTER — Other Ambulatory Visit: Payer: Self-pay | Admitting: Nurse Practitioner

## 2023-01-26 ENCOUNTER — Other Ambulatory Visit: Payer: Self-pay | Admitting: Nurse Practitioner

## 2023-02-08 ENCOUNTER — Other Ambulatory Visit: Payer: Self-pay | Admitting: Nurse Practitioner

## 2023-02-16 ENCOUNTER — Other Ambulatory Visit: Payer: Self-pay | Admitting: Nurse Practitioner

## 2023-02-16 DIAGNOSIS — G2581 Restless legs syndrome: Secondary | ICD-10-CM

## 2023-02-18 ENCOUNTER — Other Ambulatory Visit: Payer: Self-pay | Admitting: Nurse Practitioner

## 2023-03-10 ENCOUNTER — Telehealth: Payer: Medicare HMO | Admitting: Physician Assistant

## 2023-03-10 ENCOUNTER — Encounter: Payer: Self-pay | Admitting: Physician Assistant

## 2023-03-10 DIAGNOSIS — J019 Acute sinusitis, unspecified: Secondary | ICD-10-CM

## 2023-03-10 DIAGNOSIS — R519 Headache, unspecified: Secondary | ICD-10-CM | POA: Diagnosis not present

## 2023-03-10 DIAGNOSIS — B9789 Other viral agents as the cause of diseases classified elsewhere: Secondary | ICD-10-CM | POA: Diagnosis not present

## 2023-03-10 MED ORDER — PREDNISONE 20 MG PO TABS: 40.0000 mg | ORAL_TABLET | Freq: Every day | ORAL | 0 refills | Status: DC

## 2023-03-10 NOTE — Progress Notes (Signed)
I have spent 5 minutes in review of e-visit questionnaire, review and updating patient chart, medical decision making and response to patient.   William Cody Martin, PA-C    

## 2023-03-10 NOTE — Progress Notes (Signed)
Duplicate encounter

## 2023-03-10 NOTE — Progress Notes (Signed)
E-Visit for Sinus Problems  We are sorry that you are not feeling well.  Here is how we plan to help!  Based on what you have shared with me it looks like you have sinusitis.  Sinusitis is inflammation and infection in the sinus cavities of the head.  Based on your presentation I believe you most likely have Acute Viral Sinusitis.This is an infection most likely caused by a virus. There is not specific treatment for viral sinusitis other than to help you with the symptoms until the infection runs its course.  You may use an oral decongestant such as Mucinex D or if you have glaucoma or high blood pressure use plain Mucinex. Saline nasal spray help and can safely be used as often as needed for congestion, I have prescribed: a course of prednisone to reduce sinus inflammation and speed recover. This should also help break the migraine cycle you are having.   Some authorities believe that zinc sprays or the use of Echinacea may shorten the course of your symptoms.  Sinus infections are not as easily transmitted as other respiratory infection, however we still recommend that you avoid close contact with loved ones, especially the very young and elderly.  Remember to wash your hands thoroughly throughout the day as this is the number one way to prevent the spread of infection!  I have sent a work note to Pharmacologist. You can find by going to the Menu on your homepage, scrolling down to the Communications section, and selecting Letters. Let us know if you have any issue locating. Take care and feel better soon!   Home Care: Only take medications as instructed by your medical team. Do not take these medications with alcohol. A steam or ultrasonic humidifier can help congestion.  You can place a towel over your head and breathe in the steam from hot water coming from a faucet. Avoid close contacts especially the very young and the elderly. Cover your mouth when you cough or sneeze. Always remember to  wash your hands.  Get Help Right Away If: You develop worsening fever or sinus pain. You develop a severe head ache or visual changes. Your symptoms persist after you have completed your treatment plan.  Make sure you Understand these instructions. Will watch your condition. Will get help right away if you are not doing well or get worse.   Thank you for choosing an e-visit.  Your e-visit answers were reviewed by a board certified advanced clinical practitioner to complete your personal care plan. Depending upon the condition, your plan could have included both over the counter or prescription medications.  Please review your pharmacy choice. Make sure the pharmacy is open so you can pick up prescription now. If there is a problem, you may contact your provider through [employer] and have the prescription routed to another pharmacy.  Your safety is important to [place]. If you have drug allergies check your prescription carefully.   For the next 24 hours you can use MyChart to ask questions about today's visit, request a non-urgent call back, or ask for a work or school excuse. You will get an email in the next two days asking about your experience. I hope that your e-visit has been valuable and will speed your recovery.

## 2023-03-10 NOTE — Progress Notes (Signed)
Message sent to patient requesting further input regarding current symptoms. Awaiting patient response.  

## 2023-04-08 ENCOUNTER — Other Ambulatory Visit: Payer: Self-pay | Admitting: Nurse Practitioner

## 2023-04-08 DIAGNOSIS — Z1212 Encounter for screening for malignant neoplasm of rectum: Secondary | ICD-10-CM

## 2023-04-08 DIAGNOSIS — Z1211 Encounter for screening for malignant neoplasm of colon: Secondary | ICD-10-CM

## 2023-04-12 ENCOUNTER — Telehealth: Payer: Medicare HMO | Admitting: Physician Assistant

## 2023-04-12 DIAGNOSIS — R051 Acute cough: Secondary | ICD-10-CM

## 2023-04-12 DIAGNOSIS — B9689 Other specified bacterial agents as the cause of diseases classified elsewhere: Secondary | ICD-10-CM

## 2023-04-12 DIAGNOSIS — J019 Acute sinusitis, unspecified: Secondary | ICD-10-CM | POA: Diagnosis not present

## 2023-04-12 DIAGNOSIS — H539 Unspecified visual disturbance: Secondary | ICD-10-CM

## 2023-04-12 DIAGNOSIS — J3489 Other specified disorders of nose and nasal sinuses: Secondary | ICD-10-CM

## 2023-04-12 MED ORDER — BENZONATATE 100 MG PO CAPS
100.0000 mg | ORAL_CAPSULE | Freq: Three times a day (TID) | ORAL | 0 refills | Status: DC | PRN
Start: 2023-04-12 — End: 2023-07-18

## 2023-04-12 MED ORDER — AMOXICILLIN-POT CLAVULANATE 875-125 MG PO TABS
1.0000 | ORAL_TABLET | Freq: Two times a day (BID) | ORAL | 0 refills | Status: DC
Start: 2023-04-12 — End: 2023-05-20

## 2023-04-12 NOTE — Progress Notes (Signed)
   Thank you for the details you included in the comment boxes. Those details are very helpful in determining the best course of treatment for you and help [place] to provide the best care.Because of vision changes noted along with the sinus pain and need for further evaluation, we recommend that you schedule a Virtual Urgent Care video visit in order for the provider to better assess what is going on.  The provider will be able to give you a more accurate diagnosis and treatment plan if we can more freely discuss your symptoms and with the addition of a virtual examination.   If you change your visit to a video visit, we will bill your insurance (similar to an office visit) and you will not be charged for this e-Visit. You will be able to stay at home and speak with the first available [facility] advanced practice provider. The link to do a video visit is in the drop down Menu tab of your Welcome screen in MyChart.

## 2023-04-12 NOTE — Progress Notes (Signed)
Virtual Visit Consent   Kristi [name], you are scheduled for a virtual visit with a [facility] provider today. Just as with appointments in the office, your consent must be obtained to participate. Your consent will be active for this visit and any virtual visit you may have with one of our providers in the next 365 days. If you have a MyChart account, a copy of this consent can be sent to you electronically.  As this is a virtual visit, video technology does not allow for your provider to perform a traditional examination. This may limit your provider's ability to fully assess your condition. If your provider identifies any concerns that need to be evaluated in person or the need to arrange testing (such as labs, EKG, etc.), we will make arrangements to do so. Although advances in technology are sophisticated, we cannot ensure that it will always work on either your end or our end. If the connection with a video visit is poor, the visit may have to be switched to a telephone visit. With either a video or telephone visit, we are not always able to ensure that we have a secure connection.  By engaging in this virtual visit, you consent to the provision of healthcare and authorize for your insurance to be billed (if applicable) for the services provided during this visit. Depending on your insurance coverage, you may receive a charge related to this service.  I need to obtain your verbal consent now. Are you willing to proceed with your visit today? Kristi [name] has provided verbal consent on 04/12/2023 for a virtual visit (video or telephone). Margaretann Loveless, PA-C  Date: 04/12/2023 2:31 PM  Virtual Visit via Video Note   I, Margaretann Loveless, connected with  Kristi [name]  ([id number], 01/15/1956) on 04/12/23 at  2:30 PM EDT by a video-enabled telemedicine application and verified that I am speaking with the correct person using two identifiers.  Location: Patient: Virtual Visit Location  Patient: Home Provider: Virtual Visit Location Provider: Home Office   I discussed the limitations of evaluation and management by telemedicine and the availability of in person appointments. The patient expressed understanding and agreed to proceed.    History of Present Illness: Kristi [name] is a 67 y.o. who identifies as a female who was assigned female at birth, and is being seen today for possible sinus infection.  HPI: Sinusitis This is a recurrent problem. The current episode started in the past 7 days. The problem has been gradually worsening since onset. There has been no fever. Associated symptoms include congestion, coughing, headaches, a hoarse voice, sinus pressure and a sore throat (raspy and tickles from post nasal drainage). Pertinent negatives include no chills or ear pain. (Sensitivity to light with sinus pressure, hoarse voice from coughing) Past treatments include oral decongestants, saline nose sprays, nasal decongestants and acetaminophen (flonase, sudafed, tylenol). The treatment provided no relief.     Problems:  Patient Active Problem List   Diagnosis Date Noted   Migraine with aura and without status migrainosus, not intractable 10/05/2021   History of stomach ulcers 09/16/2021   Morbid obesity (HCC) 05/05/2021   Dyspnea 05/05/2021   Chest pain 02/24/2017   Atypical chest pain 05/10/2016   HTN (hypertension) 05/10/2016   Hypertensive emergency 05/10/2016   Anxiety 05/10/2016   Gastroesophageal reflux disease with esophagitis     Allergies:  Allergies  Allergen Reactions   Fish Allergy Anaphylaxis   Iodine Anaphylaxis   Sulfa Antibiotics Hives   Medications:  Current Outpatient Medications:    amoxicillin-clavulanate (AUGMENTIN) 875-125 MG tablet, Take 1 tablet by mouth 2 (two) times daily., Disp: 14 tablet, Rfl: 0   benzonatate (TESSALON) 100 MG capsule, Take 1-2 capsules (100-200 mg total) by mouth 3 (three) times daily as needed., Disp: 30 capsule,  Rfl: 0   acetaminophen (TYLENOL) 650 MG CR tablet, Take 650 mg by mouth every 6 (six) hours as needed for pain., Disp: , Rfl:    albuterol (VENTOLIN HFA) 108 (90 Base) MCG/ACT inhaler, Inhale 2 puffs into the lungs every 6 (six) hours as needed for wheezing or shortness of breath., Disp: 18 g, Rfl: 0   amLODipine (NORVASC) 10 MG tablet, TAKE 1 TABLET BY MOUTH DAILY, Disp: 100 tablet, Rfl: 1   aspirin EC 81 MG tablet, Take 1 tablet (81 mg total) by mouth daily. Swallow whole., Disp: 90 tablet, Rfl: 3   atorvastatin (LIPITOR) 20 MG tablet, TAKE 1 TABLET BY MOUTH DAILY, Disp: 90 tablet, Rfl: 3   Blood Glucose Monitoring Suppl (ONETOUCH VERIO) w/Device KIT, Use to test blood sugar twice daily. Dx: E11.69, Disp: 1 kit, Rfl: 0   cyanocobalamin (VITAMIN B12) 1000 MCG tablet, Take 1,000 mcg by mouth daily., Disp: , Rfl:    diclofenac (VOLTAREN) 75 MG EC tablet, TAKE 1 TABLET BY MOUTH TWICE  DAILY, Disp: 180 tablet, Rfl: 3   fluticasone (FLONASE) 50 MCG/ACT nasal spray, Place 2 sprays into both nostrils daily., Disp: 16 g, Rfl: 0   gabapentin (NEURONTIN) 100 MG capsule, TAKE 1 CAPSULE BY MOUTH AT  BEDTIME, Disp: 90 capsule, Rfl: 1   glucose blood (ONETOUCH VERIO) test strip, Use to test blood sugar twice daily. Dx: E11.69, Disp: 200 each, Rfl: 3   Lancets (ONETOUCH ULTRASOFT) lancets, Use to test blood sugar twice daily. Dx: E11.69, Disp: 200 each, Rfl: 3   Melatonin 10 MG TABS, Take 10 mg by mouth at bedtime as needed (Sleep)., Disp: , Rfl:    metFORMIN (GLUCOPHAGE) 500 MG tablet, TAKE 1 TABLET BY MOUTH DAILY  WITH BREAKFAST, Disp: 100 tablet, Rfl: 1   Na Sulfate-K Sulfate-Mg Sulf 17.5-3.13-1.6 GM/177ML SOLN, Use as directed; may use generic; goodrx card if insurance will not cover generic, Disp: 354 mL, Rfl: 0   ondansetron (ZOFRAN) 4 MG tablet, Take 1 tablet (4 mg total) by mouth 3 (three) times daily as needed for nausea or vomiting., Disp: 30 tablet, Rfl: 1   pantoprazole (PROTONIX) 40 MG tablet, TAKE 1  TABLET BY MOUTH EVERY  MORNING, Disp: 100 tablet, Rfl: 1   predniSONE (DELTASONE) 20 MG tablet, Take 2 tablets (40 mg total) by mouth daily with breakfast., Disp: 10 tablet, Rfl: 0   sodium chloride (OCEAN) 0.65 % SOLN nasal spray, Place 1 spray into both nostrils daily as needed for congestion., Disp: , Rfl:    SUMAtriptan (IMITREX) 25 MG tablet, Take one tablet by mouth as needed for Headache. May repeat in 2 hours if headache persists or recurs. MAX 3 tablets/24 hours, Disp: 10 tablet, Rfl: 3   venlafaxine XR (EFFEXOR-XR) 75 MG 24 hr capsule, Take 1 capsule (75 mg total) by mouth daily with breakfast. PATIENT NEEDS TO SCHEDULE APPOINTMENT BEFORE ANY ADDITIONAL REFILLS, Disp: 30 capsule, Rfl: 0   Vitamin D, Ergocalciferol, (DRISDOL) 1.25 MG (50000 UNIT) CAPS capsule, Take 1 capsule by mouth weekly., Disp: 12 capsule, Rfl: 2  Observations/Objective: Patient is well-developed, well-nourished in no acute distress.  Resting comfortably at home.  Head is normocephalic, atraumatic.  No labored breathing.  Speech is clear and coherent with logical content.  Patient is alert and oriented at baseline.    Assessment and Plan: 1. Acute bacterial sinusitis - amoxicillin-clavulanate (AUGMENTIN) 875-125 MG tablet; Take 1 tablet by mouth 2 (two) times daily.  Dispense: 14 tablet; Refill: 0  2. Acute cough - benzonatate (TESSALON) 100 MG capsule; Take 1-2 capsules (100-200 mg total) by mouth 3 (three) times daily as needed.  Dispense: 30 capsule; Refill: 0  - Worsening symptoms that have not responded to OTC medications.  - Will give Augmentin and Tessalon  - Continue allergy medications.  - Steam and humidifier can help - Stay well hydrated and get plenty of rest.  - Seek in person evaluation if no symptom improvement or if symptoms worsen   Follow Up Instructions: I discussed the assessment and treatment plan with the patient. The patient was provided an opportunity to ask questions and all were  answered. The patient agreed with the plan and demonstrated an understanding of the instructions.  A copy of instructions were sent to the patient via MyChart unless otherwise noted below.    The patient was advised to call back or seek an in-person evaluation if the symptoms worsen or if the condition fails to improve as anticipated.    Margaretann Loveless, PA-C

## 2023-04-12 NOTE — Patient Instructions (Signed)
Nafeesah Elsen, thank you for joining Margaretann Loveless, PA-C for today's virtual visit.  While this provider is not your primary care provider (PCP), if your PCP is located in our provider database this encounter information will be shared with them immediately following your visit.   A [facility] MyChart account gives you access to today's visit and all your visits, tests, and labs performed at [facility] " click here if you don't have a [facility] MyChart account or go to [email or web address]  Consent: (Patient) Jazz Steier provided verbal consent for this virtual visit at the beginning of the encounter.  Current Medications:  Current Outpatient Medications:    amoxicillin-clavulanate (AUGMENTIN) 875-125 MG tablet, Take 1 tablet by mouth 2 (two) times daily., Disp: 14 tablet, Rfl: 0   benzonatate (TESSALON) 100 MG capsule, Take 1-2 capsules (100-200 mg total) by mouth 3 (three) times daily as needed., Disp: 30 capsule, Rfl: 0   acetaminophen (TYLENOL) 650 MG CR tablet, Take 650 mg by mouth every 6 (six) hours as needed for pain., Disp: , Rfl:    albuterol (VENTOLIN HFA) 108 (90 Base) MCG/ACT inhaler, Inhale 2 puffs into the lungs every 6 (six) hours as needed for wheezing or shortness of breath., Disp: 18 g, Rfl: 0   amLODipine (NORVASC) 10 MG tablet, TAKE 1 TABLET BY MOUTH DAILY, Disp: 100 tablet, Rfl: 1   aspirin EC 81 MG tablet, Take 1 tablet (81 mg total) by mouth daily. Swallow whole., Disp: 90 tablet, Rfl: 3   atorvastatin (LIPITOR) 20 MG tablet, TAKE 1 TABLET BY MOUTH DAILY, Disp: 90 tablet, Rfl: 3   Blood Glucose Monitoring Suppl (ONETOUCH VERIO) w/Device KIT, Use to test blood sugar twice daily. Dx: E11.69, Disp: 1 kit, Rfl: 0   cyanocobalamin (VITAMIN B12) 1000 MCG tablet, Take 1,000 mcg by mouth daily., Disp: , Rfl:    diclofenac (VOLTAREN) 75 MG EC tablet, TAKE 1 TABLET BY MOUTH TWICE  DAILY, Disp: 180 tablet, Rfl: 3   fluticasone (FLONASE) 50 MCG/ACT  nasal spray, Place 2 sprays into both nostrils daily., Disp: 16 g, Rfl: 0   gabapentin (NEURONTIN) 100 MG capsule, TAKE 1 CAPSULE BY MOUTH AT  BEDTIME, Disp: 90 capsule, Rfl: 1   glucose blood (ONETOUCH VERIO) test strip, Use to test blood sugar twice daily. Dx: E11.69, Disp: 200 each, Rfl: 3   Lancets (ONETOUCH ULTRASOFT) lancets, Use to test blood sugar twice daily. Dx: E11.69, Disp: 200 each, Rfl: 3   Melatonin 10 MG TABS, Take 10 mg by mouth at bedtime as needed (Sleep)., Disp: , Rfl:    metFORMIN (GLUCOPHAGE) 500 MG tablet, TAKE 1 TABLET BY MOUTH DAILY  WITH BREAKFAST, Disp: 100 tablet, Rfl: 1   Na Sulfate-K Sulfate-Mg Sulf 17.5-3.13-1.6 GM/177ML SOLN, Use as directed; may use generic; goodrx card if insurance will not cover generic, Disp: 354 mL, Rfl: 0   ondansetron (ZOFRAN) 4 MG tablet, Take 1 tablet (4 mg total) by mouth 3 (three) times daily as needed for nausea or vomiting., Disp: 30 tablet, Rfl: 1   pantoprazole (PROTONIX) 40 MG tablet, TAKE 1 TABLET BY MOUTH EVERY  MORNING, Disp: 100 tablet, Rfl: 1   predniSONE (DELTASONE) 20 MG tablet, Take 2 tablets (40 mg total) by mouth daily with breakfast., Disp: 10 tablet, Rfl: 0   sodium chloride (OCEAN) 0.65 % SOLN nasal spray, Place 1 spray into both nostrils daily as needed for congestion., Disp: , Rfl:    SUMAtriptan (IMITREX) 25 MG tablet, Take one tablet by  mouth as needed for Headache. May repeat in 2 hours if headache persists or recurs. MAX 3 tablets/24 hours, Disp: 10 tablet, Rfl: 3   venlafaxine XR (EFFEXOR-XR) 75 MG 24 hr capsule, Take 1 capsule (75 mg total) by mouth daily with breakfast. PATIENT NEEDS TO SCHEDULE APPOINTMENT BEFORE ANY ADDITIONAL REFILLS, Disp: 30 capsule, Rfl: 0   Vitamin D, Ergocalciferol, (DRISDOL) 1.25 MG (50000 UNIT) CAPS capsule, Take 1 capsule by mouth weekly., Disp: 12 capsule, Rfl: 2   Medications ordered in this encounter:  Meds ordered this encounter  Medications   amoxicillin-clavulanate (AUGMENTIN)  875-125 MG tablet    Sig: Take 1 tablet by mouth 2 (two) times daily.    Dispense:  14 tablet    Refill:  0    Order Specific Question:   Supervising Provider    Answer:   Merrilee Jansky [[id number]]   benzonatate (TESSALON) 100 MG capsule    Sig: Take 1-2 capsules (100-200 mg total) by mouth 3 (three) times daily as needed.    Dispense:  30 capsule    Refill:  0    Order Specific Question:   Supervising Provider    Answer:   Merrilee Jansky [id number]     *If you need refills on other medications prior to your next appointment, please contact your pharmacy*  Follow-Up: Call back or seek an in-person evaluation if the symptoms worsen or if the condition fails to improve as anticipated.  [facility] [phone]  Other Instructions  Sinus Infection, Adult A sinus infection, also called sinusitis, is inflammation of your sinuses. Sinuses are hollow spaces in the [place] around your face. Your sinuses are located: Around your eyes. In the middle of your forehead. Behind your nose. In your cheekbones. Mucus normally drains out of your sinuses. When your nasal tissues become inflamed or swollen, mucus can become trapped or blocked. This allows bacteria, viruses, and fungi to grow, which leads to infection. Most infections of the sinuses are caused by a virus. A sinus infection can develop quickly. It can last for up to 4 weeks (acute) or for more than 12 weeks (chronic). A sinus infection often develops after a cold. What are the causes? This condition is caused by anything that creates swelling in the sinuses or stops mucus from draining. This includes: Allergies. Asthma. Infection from bacteria or viruses. Deformities or blockages in your nose or sinuses. Abnormal growths in the nose (nasal polyps). Pollutants, such as chemicals or irritants in the air. Infection from fungi. This is rare. What increases the risk? You are more likely to develop this condition if  you: Have a weak body defense system (immune system). Do a lot of swimming or diving. Overuse nasal sprays. Smoke. What are the signs or symptoms? The main symptoms of this condition are pain and a feeling of pressure around the affected sinuses. Other symptoms include: Stuffy nose or congestion that makes it difficult to breathe through your nose. Thick yellow or greenish drainage from your nose. Tenderness, swelling, and warmth over the affected sinuses. A cough that may get worse at night. Decreased sense of smell and taste. Extra mucus that collects in the throat or the back of the nose (postnasal drip) causing a sore throat or bad breath. Tiredness (fatigue). Fever. How is this diagnosed? This condition is diagnosed based on: Your symptoms. Your medical history. A physical exam. Tests to find out if your condition is acute or chronic. This may include: Checking your nose  for nasal polyps. Viewing your sinuses using a device that has a light (endoscope). Testing for allergies or bacteria. Imaging tests, such as an MRI or CT scan. In rare cases, a bone biopsy may be done to rule out more serious types of fungal sinus disease. How is this treated? Treatment for a sinus infection depends on the cause and whether your condition is chronic or acute. If caused by a virus, your symptoms should go away on their own within 10 days. You may be given medicines to relieve symptoms. They include: Medicines that shrink swollen nasal passages (decongestants). A spray that eases inflammation of the nostrils (topical intranasal corticosteroids). Rinses that help get rid of thick mucus in your nose (nasal saline washes). Medicines that treat allergies (antihistamines). Over-the-counter pain relievers. If caused by bacteria, your health care provider may recommend waiting to see if your symptoms improve. Most bacterial infections will get better without antibiotic medicine. You may be given  antibiotics if you have: A severe infection. A weak immune system. If caused by narrow nasal passages or nasal polyps, surgery may be needed. Follow these instructions at home: Medicines Take, use, or apply over-the-counter and prescription medicines only as told by your health care provider. These may include nasal sprays. If you were prescribed an antibiotic medicine, take it as told by your health care provider. Do not stop taking the antibiotic even if you start to feel better. Hydrate and humidify  Drink enough fluid to keep your urine pale yellow. Staying hydrated will help to thin your mucus. Use a cool mist humidifier to keep the humidity level in your home above 50%. Inhale steam for 10-15 minutes, 3-4 times a day, or as told by your health care provider. You can do this in the bathroom while a hot shower is running. Limit your exposure to cool or dry air. Rest Rest as much as possible. Sleep with your head raised (elevated). Make sure you get enough sleep each night. General instructions  Apply a warm, moist washcloth to your face 3-4 times a day or as told by your health care provider. This will help with discomfort. Use nasal saline washes as often as told by your health care provider. Wash your hands often with soap and water to reduce your exposure to germs. If soap and water are not available, use hand sanitizer. Do not smoke. Avoid being around people who are smoking (secondhand smoke). Keep all follow-up visits. This is important. Contact a health care provider if: You have a fever. Your symptoms get worse. Your symptoms do not improve within 10 days. Get help right away if: You have a severe headache. You have persistent vomiting. You have severe pain or swelling around your face or eyes. You have vision problems. You develop confusion. Your neck is stiff. You have trouble breathing. These symptoms may be an emergency. Get help right away. Call 911. Do not wait  to see if the symptoms will go away. Do not drive yourself to the hospital. Summary A sinus infection is soreness and inflammation of your sinuses. Sinuses are hollow spaces in the [place] around your face. This condition is caused by nasal tissues that become inflamed or swollen. The swelling traps or blocks the flow of mucus. This allows bacteria, viruses, and fungi to grow, which leads to infection. If you were prescribed an antibiotic medicine, take it as told by your health care provider. Do not stop taking the antibiotic even if you start to feel better. Keep  all follow-up visits. This is important. This information is not intended to replace advice given to you by your health care provider. Make sure you discuss any questions you have with your health care provider. Document Revised: 05/26/2021 Document Reviewed: 05/26/2021 Elsevier Patient Education  2024 [employer]    If you have been instructed to have an in-person evaluation today at a local Urgent Care facility, please use the link below. It will take you to a list of all of our available [facility], including address, phone number and hours of operation. Please do not delay care.  [facility]  If you or a family member do not have a primary care provider, use the link below to schedule a visit and establish care. When you choose a [facility] primary care physician or advanced practice provider, you gain a long-term partner in health. Find a Primary Care Provider  Learn more about [facility]'s in-office and virtual care options: [facility] - Get Care Now

## 2023-04-19 DIAGNOSIS — Z1212 Encounter for screening for malignant neoplasm of rectum: Secondary | ICD-10-CM | POA: Diagnosis not present

## 2023-04-19 DIAGNOSIS — Z1211 Encounter for screening for malignant neoplasm of colon: Secondary | ICD-10-CM | POA: Diagnosis not present

## 2023-04-26 LAB — COLOGUARD: COLOGUARD: NEGATIVE

## 2023-04-28 ENCOUNTER — Telehealth: Payer: Medicare HMO

## 2023-04-28 NOTE — Telephone Encounter (Signed)
Attempted to call patient to schedule a appointment. Patient mailbox was full

## 2023-04-28 NOTE — Telephone Encounter (Signed)
She is overdue for follow up, please have her schedule appt for follow up visit.

## 2023-04-28 NOTE — Telephone Encounter (Signed)
Patient is calling for a refill on her on her gabapentin.But I told her she's not due for the refill at this time. She would like a new prescription sent even though pharmacy is saying the same.

## 2023-05-07 ENCOUNTER — Other Ambulatory Visit: Payer: Self-pay | Admitting: Nurse Practitioner

## 2023-05-07 DIAGNOSIS — R7303 Prediabetes: Secondary | ICD-10-CM

## 2023-05-18 ENCOUNTER — Other Ambulatory Visit: Payer: Self-pay

## 2023-05-18 DIAGNOSIS — R7303 Prediabetes: Secondary | ICD-10-CM

## 2023-05-18 MED ORDER — METFORMIN HCL 500 MG PO TABS: 500.0000 mg | ORAL_TABLET | Freq: Every day | ORAL | 0 refills | Status: DC

## 2023-05-18 MED ORDER — AMLODIPINE BESYLATE 10 MG PO TABS: 10.0000 mg | ORAL_TABLET | Freq: Every day | ORAL | 0 refills | Status: DC

## 2023-05-18 MED ORDER — VENLAFAXINE HCL ER 75 MG PO CP24: 75.0000 mg | ORAL_CAPSULE | Freq: Every day | ORAL | 0 refills | Status: DC

## 2023-05-18 MED ORDER — PANTOPRAZOLE SODIUM 40 MG PO TBEC: 40.0000 mg | DELAYED_RELEASE_TABLET | Freq: Every morning | ORAL | 0 refills | Status: DC

## 2023-05-18 NOTE — Addendum Note (Signed)
Addended by: Maurice Small on: 05/18/2023 12:14 PM   Modules accepted: Orders

## 2023-05-20 ENCOUNTER — Ambulatory Visit (INDEPENDENT_AMBULATORY_CARE_PROVIDER_SITE_OTHER): Payer: Medicare HMO | Admitting: Family

## 2023-05-20 ENCOUNTER — Encounter: Payer: Self-pay | Admitting: Family

## 2023-05-20 DIAGNOSIS — Z0000 Encounter for general adult medical examination without abnormal findings: Secondary | ICD-10-CM | POA: Diagnosis not present

## 2023-05-20 DIAGNOSIS — G2581 Restless legs syndrome: Secondary | ICD-10-CM | POA: Diagnosis not present

## 2023-05-20 DIAGNOSIS — Z1231 Encounter for screening mammogram for malignant neoplasm of breast: Secondary | ICD-10-CM

## 2023-05-20 MED ORDER — GABAPENTIN 100 MG PO CAPS: 100.0000 mg | ORAL_CAPSULE | Freq: Every day | ORAL | 1 refills | Status: DC

## 2023-05-20 NOTE — Progress Notes (Signed)
This service is provided via telemedicine  No vital signs collected/recorded due to the encounter was a telemedicine visit.   Location of patient (ex: home, work):  Home  Patient consents to a telephone visit:    Location of the provider (ex: office, home):  Office  Name of any referring provider:  Sharon Seller, NP   Names of all persons participating in the telemedicine service and their role in the encounter:  Lanora Manis Mathia; Samul Dada; Irini Leet,NP  Time spent on call:  20 minutes      Subjective:   Kristi [name] is a 67 y.o. female who presents for Medicare Annual (Subsequent) preventive examination.  Visit Complete: Virtual I connected with  Katja Bozman on 05/20/23 by a video and audio enabled telemedicine application and verified that I am speaking with the correct person using two identifiers.  Patient Location: Home  Provider Location: Office/Clinic  I discussed the limitations of evaluation and management by telemedicine. The patient expressed understanding and agreed to proceed.  Vital Signs: Because this visit was a virtual/telehealth visit, some criteria may be missing or patient reported. Any vitals not documented were not able to be obtained and vitals that have been documented are patient reported.  Patient Medicare AWV questionnaire was completed by the patient on 05/20/2023; I have confirmed that all information answered by patient is correct and no changes since this date.  Cardiac Risk Factors include: advanced age (>21men, >17 women);diabetes mellitus;hypertension;obesity (BMI >30kg/m2);smoking/ tobacco exposure;sedentary lifestyle     Objective:    Today's Vitals   05/20/23 1354 05/20/23 1607  PainSc: 8  9    There is no height or weight on file to calculate BMI.     05/20/2023    2:05 PM 09/09/2022    7:09 AM 09/03/2022   10:22 AM 05/07/2022   11:41 AM 02/09/2022    8:29 AM 11/13/2021   10:10 AM 10/05/2021    8:35 AM   Advanced Directives  Does Patient Have a Medical Advance Directive? Yes No No Yes No No No  Type of Advance Directive Living will;Healthcare Power of Attorney   Living will;Out of facility DNR (pink MOST or yellow form)     Does patient want to make changes to medical advance directive? No - Patient declined   No - Patient declined     Copy of Healthcare Power of Attorney in Chart? No - copy requested   No - copy requested     Would patient like information on creating a medical advance directive?   Yes (MAU/Ambulatory/Procedural Areas - Information given)  No - Patient declined No - Patient declined No - Patient declined    Current Medications (verified) Outpatient Encounter Medications as of 05/20/2023  Medication Sig   acetaminophen (TYLENOL) 650 MG CR tablet Take 650 mg by mouth every 6 (six) hours as needed for pain.   albuterol (VENTOLIN HFA) 108 (90 Base) MCG/ACT inhaler Inhale 2 puffs into the lungs every 6 (six) hours as needed for wheezing or shortness of breath.   amLODipine (NORVASC) 10 MG tablet Take 1 tablet (10 mg total) by mouth daily.   aspirin EC 81 MG tablet Take 1 tablet (81 mg total) by mouth daily. Swallow whole.   atorvastatin (LIPITOR) 20 MG tablet TAKE 1 TABLET BY MOUTH DAILY   benzonatate (TESSALON) 100 MG capsule Take 1-2 capsules (100-200 mg total) by mouth 3 (three) times daily as needed.   Blood Glucose Monitoring Suppl (ONETOUCH VERIO) w/Device KIT Use to  test blood sugar twice daily. Dx: E11.69   cyanocobalamin (VITAMIN B12) 1000 MCG tablet Take 1,000 mcg by mouth daily.   diclofenac (VOLTAREN) 75 MG EC tablet TAKE 1 TABLET BY MOUTH TWICE  DAILY   fluticasone (FLONASE) 50 MCG/ACT nasal spray Place 2 sprays into both nostrils daily.   glucose blood (ONETOUCH VERIO) test strip Use to test blood sugar twice daily. Dx: E11.69   Lancets (ONETOUCH ULTRASOFT) lancets Use to test blood sugar twice daily. Dx: E11.69   Melatonin 10 MG TABS Take 10 mg by mouth at bedtime  as needed (Sleep).   metFORMIN (GLUCOPHAGE) 500 MG tablet Take 1 tablet (500 mg total) by mouth daily with breakfast.   Na Sulfate-K Sulfate-Mg Sulf 17.5-3.13-1.6 GM/177ML SOLN Use as directed; may use generic; goodrx card if insurance will not cover generic   ondansetron (ZOFRAN) 4 MG tablet Take 1 tablet (4 mg total) by mouth 3 (three) times daily as needed for nausea or vomiting.   pantoprazole (PROTONIX) 40 MG tablet Take 1 tablet (40 mg total) by mouth every morning.   sodium chloride (OCEAN) 0.65 % SOLN nasal spray Place 1 spray into both nostrils daily as needed for congestion.   SUMAtriptan (IMITREX) 25 MG tablet Take one tablet by mouth as needed for Headache. May repeat in 2 hours if headache persists or recurs. MAX 3 tablets/24 hours   venlafaxine XR (EFFEXOR-XR) 75 MG 24 hr capsule Take 1 capsule (75 mg total) by mouth daily with breakfast. PATIENT NEEDS TO SCHEDULE APPOINTMENT BEFORE ANY ADDITIONAL REFILLS   Vitamin D, Ergocalciferol, (DRISDOL) 1.25 MG (50000 UNIT) CAPS capsule Take 1 capsule by mouth weekly.   [DISCONTINUED] gabapentin (NEURONTIN) 100 MG capsule TAKE 1 CAPSULE BY MOUTH AT  BEDTIME   gabapentin (NEURONTIN) 100 MG capsule Take 1 capsule (100 mg total) by mouth at bedtime.   [DISCONTINUED] amoxicillin-clavulanate (AUGMENTIN) 875-125 MG tablet Take 1 tablet by mouth 2 (two) times daily.   [DISCONTINUED] predniSONE (DELTASONE) 20 MG tablet Take 2 tablets (40 mg total) by mouth daily with breakfast.   No facility-administered encounter medications on file as of 05/20/2023.    Allergies (verified) Fish allergy, Iodine, and Sulfa antibiotics   History: Past Medical History:  Diagnosis Date   Anemia    Angina at rest [facility])    Anxiety    Arthritis    Asthma    B12 deficiency    Back pain    Chest pain    Constipation    Depression    Edema of both lower extremities    Fatigue    Gallbladder problem    GERD (gastroesophageal reflux disease)    Glaucoma     History of stomach ulcers    History of stomach ulcers    Hyperlipidemia    Hypertension    Joint pain    Migraines    Multiple food allergies    Multiple food allergies    Obesity    Osteoarthritis    Pre-diabetes    Sleep apnea    SOB (shortness of breath)    Past Surgical History:  Procedure Laterality Date   ABDOMINAL HYSTERECTOMY     APPENDECTOMY     CHOLECYSTECTOMY     GALLBLADDER SURGERY  1989   Family History  Problem Relation Age of Onset   Stroke Mother    Hyperlipidemia Mother    Hypertension Mother    Heart disease Mother        3 CABG, stents placements.  Heart attack Mother        late 39s   Diabetes Mother    High Cholesterol Mother    Kidney disease Mother    Depression Mother    Sleep apnea Mother    Alcoholism Mother    Obesity Mother    Cancer Father    Breast cancer Sister    Breast cancer Sister    Breast cancer Sister    Breast cancer Maternal Aunt    Diabetes Other    Heart disease Other    Social History   Socioeconomic History   Marital status: Divorced    Spouse name: Not on file   Number of children: Not on file   Years of education: Not on file   Highest education level: Not on file  Occupational History   Occupation: Customer service agent  Tobacco Use   Smoking status: Former    Current packs/day: 0.00    Types: Cigarettes    Quit date: 02/27/2006    Years since quitting: 17.2   Smokeless tobacco: Never  Vaping Use   Vaping status: Never Used  Substance and Sexual Activity   Alcohol use: No   Drug use: No   Sexual activity: Not Currently    Birth control/protection: Surgical  Other Topics Concern   Not on file  Social History Narrative   Tobacco use, amount per day now:   Past tobacco use, amount per day: 3 packs per day, stopped 02/27/2006   How many years did you use tobacco: 25 years   Alcohol use (drinks per week): None   Diet: Low salt, Low fat.   Do you drink/eat things with caffeine: Coffee   Marital  status:  Divorced                                What year were you married?    Do you live in a house, apartment, assisted living, condo, trailer, etc.? House   Is it one or more stories?   How many persons live in your home? 3   Do you have pets in your home?( please list) Dog, and Cat   Highest Level of education completed? Bachelors.   Current or past profession: Occupational psychologist.   Do you exercise?   No                               Type and how often?   Do you have a living will? No   Do you have a DNR form?     No                               If not, do you want to discuss one?   Do you have signed POA/HPOA forms? No                        If so, please bring to you appointment      Do you have any difficulty bathing or dressing yourself? No    Do you have any difficulty preparing food or eating? No   Do you have any difficulty managing your medications? No   Do you have any difficulty managing your finances? No   Do you have any difficulty affording your medications? Yes  Social Determinants of Health   Financial Resource Strain: Not on file  Food Insecurity: Not on file  Transportation Needs: Not on file  Physical Activity: Not on file  Stress: Not on file  Social Connections: Unknown (11/17/2021)   [facility]   Social Network    Social Network: Not on file    Tobacco Counseling Counseling given: Not Answered   Clinical Intake:  Pre-visit preparation completed: No  Pain : 0-10 Pain Score: 9  Pain Type: Chronic pain Pain Location: Generalized Pain Orientation: Other (Comment) (generalized) Pain Radiating Towards: yes back ,neck and knees Pain Descriptors / Indicators: Aching, Sharp Pain Onset: More than a month ago Pain Frequency: Intermittent Pain Relieving Factors: Gabapentin ,heating pad,ice pack Effect of Pain on Daily Activities: standing  Pain Relieving Factors: Gabapentin ,heating pad,ice pack  BMI -  recorded: 53.42 Nutritional Status: BMI > 30  Obese Nutritional Risks: None Diabetes: Yes CBG done?: No Did pt. bring in CBG monitor from home?: No (120  and 200 after meals)  How often do you need to have someone help you when you read instructions, pamphlets, or other written materials from your doctor or pharmacy?: 1 - Never What is the last grade level you completed in school?: [employer] in Social work  Equities trader Needed?: No  Information entered by :: [employer]   Activities of Daily Living    05/20/2023    4:37 PM 05/20/2023    2:00 PM  In your present state of health, do you have any difficulty performing the following activities:  Hearing? 0 0  Vision? 0 0  Difficulty concentrating or making decisions? 0 0  Walking or climbing stairs? 1 1  Comment walks with a cane   Dressing or bathing? 0 0  Doing errands, shopping? 1 0  Comment No transportation   Preparing Food and eating ? N N  Using the Toilet? N N  In the past six months, have you accidently leaked urine? Y Y  Do you have problems with loss of bowel control? N N  Managing your Medications? N N  Managing your Finances? N N  Housekeeping or managing your Housekeeping? N Y    Patient Care Team: Sharon Seller, NP as PCP - General (Geriatric Medicine)  Indicate any recent Medical Services you may have received from other than [facility] providers in the past year (date may be approximate).     Assessment:   This is a routine wellness examination for [place].  Hearing/Vision screen Hearing Screening - Comments:: No hearing concerns Vision Screening - Comments:: No vision concerns but wear glasses   Goals Addressed   None    Depression Screen    05/20/2023    2:06 PM 04/13/2022    9:53 AM 09/16/2021    8:17 AM 06/22/2021   11:32 AM 04/09/2021    9:49 AM 02/23/2021    2:03 PM 03/09/2017    9:50 AM  PHQ 2/9 Scores  PHQ - 2 Score 0 0 6 0 0 4 5  PHQ- 9 Score   19   18 20     Fall  Risk    05/20/2023    2:06 PM 05/16/2023    2:31 PM 09/03/2022   10:22 AM 05/07/2022   11:38 AM 04/13/2022    9:52 AM  Fall Risk   Falls in the past year? 0 0 0 1 0  Number falls in past yr: 0  0 0 0  Injury with Fall?  0  0 0 0  Risk for fall due to : No Fall Risks  No Fall Risks Impaired balance/gait No Fall Risks  Risk for fall due to: Comment    use cane for extra support   Follow up Falls evaluation completed  Falls evaluation completed Falls evaluation completed Falls evaluation completed    MEDICARE RISK AT HOME: Medicare Risk at Home Any stairs in or around the home?: (P) Yes If so, are there any without handrails?: (P) Yes Home free of loose throw rugs in walkways, pet beds, electrical cords, etc?: (P) Yes Adequate lighting in your home to reduce risk of falls?: (P) Yes Life alert?: (P) No Use of a cane, walker or w/c?: (P) Yes Grab bars in the bathroom?: (P) No Shower chair or bench in shower?: (P) Yes Elevated toilet seat or a handicapped toilet?: (P) No  TIMED UP AND GO:  Was the test performed?  No    Cognitive Function:        05/20/2023    2:09 PM 05/07/2022   11:39 AM 04/09/2021    9:54 AM  6CIT Screen  What Year? 0 points 0 points 0 points  What month? 0 points 0 points 0 points  What time? 0 points 0 points 0 points  Count back from 20 0 points 0 points 0 points  Months in reverse 0 points 0 points 0 points  Repeat phrase 6 points 2 points 2 points  Total Score 6 points 2 points 2 points    Immunizations Immunization History  Administered Date(s) Administered   Fluad Quad(high Dose 65+) 03/16/2021, 09/03/2022   Influenza,inj,Quad PF,6+ Mos 05/11/2016, 03/09/2017   Influenza-Unspecified 03/09/2017, 06/18/2020   Moderna Sars-Covid-2 Vaccination 11/23/2019, 12/21/2019, 09/29/2020   PNEUMOCOCCAL CONJUGATE-20 06/22/2021   Tdap 03/09/2017   Zoster Recombinant(Shingrix) 09/29/2020, 12/01/2020    TDAP status: Up to date  Flu Vaccine status: Due,  Education has been provided regarding the importance of this vaccine. Advised may receive this vaccine at local pharmacy or Health Dept. Aware to provide a copy of the vaccination record if obtained from local pharmacy or Health Dept. Verbalized acceptance and understanding.  Pneumococcal vaccine status: Up to date  Covid-19 vaccine status: Information provided on how to obtain vaccines.   Qualifies for Shingles Vaccine? Yes   Zostavax completed No   Shingrix Completed?: Yes  Screening Tests Health Maintenance  Topic Date Due   INFLUENZA VACCINE  02/03/2023   COVID-19 Vaccine (4 - 2023-24 season) 03/06/2023   MAMMOGRAM  05/15/2023   Diabetic kidney evaluation - eGFR measurement  09/03/2023   Diabetic kidney evaluation - Urine ACR  09/03/2023   Medicare Annual Wellness (AWV)  05/19/2024   Fecal DNA (Cologuard)  04/18/2026   DTaP/Tdap/Td (2 - Td or Tdap) 03/10/2027   Pneumonia Vaccine 28+ Years old  Completed   DEXA SCAN  Completed   Hepatitis C Screening  Completed   Zoster Vaccines- Shingrix  Completed   HPV VACCINES  Aged Out    Health Maintenance  Health Maintenance Due  Topic Date Due   INFLUENZA VACCINE  02/03/2023   COVID-19 Vaccine (4 - 2023-24 season) 03/06/2023   MAMMOGRAM  05/15/2023    Colorectal cancer screening: Type of screening: Cologuard. Completed 04/19/2023. Repeat every 3 years  Mammogram status: Ordered today. Pt provided with contact info and advised to call to schedule appt.   Bone Density status: Completed 05/14/2021. Results reflect: Bone density results: NORMAL. Repeat every 5 years.  Lung Cancer Screening: (Low  Dose CT Chest recommended if Age 17-80 years, 20 pack-year currently smoking OR have quit w/in 15years.) does not qualify.   Lung Cancer Screening Referral: No   Additional Screening:  Hepatitis C Screening: does qualify; Completed yes   Vision Screening: Recommended annual ophthalmology exams for early detection of glaucoma and other  disorders of the eye. Is the patient up to date with their annual eye exam?  yes  Who is the provider or what is the name of the office in which the patient attends annual eye exams? [facility]  If pt is not established with a provider, would they like to be referred to a provider to establish care? No .   Dental Screening: Recommended annual dental exams for proper oral hygiene  Diabetic Foot Exam: Diabetic Foot Exam: Overdue, Pt has been advised about the importance in completing this exam. Pt is scheduled for diabetic foot exam on she will call to schedule follow up appointment with PCP.  Community Resource Referral / Chronic Care Management: CRR required this visit?  No   CCM required this visit?  No     Plan:     I have personally reviewed and noted the following in the patient's chart:   Medical and social history Use of alcohol, tobacco or illicit drugs  Current medications and supplements including opioid prescriptions. Patient is not currently taking opioid prescriptions. Functional ability and status Nutritional status Physical activity Advanced directives List of other physicians Hospitalizations, surgeries, and ER visits in previous 12 months Vitals Screenings to include cognitive, depression, and falls Referrals and appointments  In addition, I have reviewed and discussed with patient certain preventive protocols, quality metrics, and best practice recommendations. A written personalized care plan for preventive services as well as general preventive health recommendations were provided to patient.     Caesar Bookman, NP   05/20/2023   After Visit Summary: (MyChart) Due to this being a telephonic visit, the after visit summary with patients personalized plan was offered to patient via MyChart   Nurse Notes: Advised to schedule appointment for Influenza vaccine and COVID-19 vaccine.   Also advised to schedule appointment for evaluation of depression  medication.Counseling service number provided on AVS to make appointment for counseling.

## 2023-05-20 NOTE — Patient Instructions (Signed)
Please call [facility] for counseling  Telephone # [phone]  Ms. Kristi [name] , Thank you for taking time to come for your Medicare Wellness Visit. I appreciate your ongoing commitment to your health goals. Please review the following plan we discussed and let me know if I can assist you in the future.   Screening recommendations/referrals: Colonoscopy ;Up to date  Mammogram : Due  Bone Density  ;Up to date  Recommended yearly ophthalmology/optometry visit for glaucoma screening and checkup Recommended yearly dental visit for hygiene and checkup  Vaccinations: Influenza vaccine- due annually in September/October Pneumococcal vaccine  ;Up to date  Tdap vaccine  ;Up to date  Shingles vaccine  ;Up to date     Advanced directives: yes   Conditions/risks identified:  advanced age (>63men, >72 women);diabetes mellitus;hypertension;obesity (BMI >30kg/m2);smoking/ tobacco exposure;sedentary lifestyle  Next appointment: 1 year    Preventive Care 3 Years and Older, Female Preventive care refers to lifestyle choices and visits with your health care provider that can promote health and wellness. What does preventive care include? A yearly physical exam. This is also called an annual well check. Dental exams once or twice a year. Routine eye exams. Ask your health care provider how often you should have your eyes checked. Personal lifestyle choices, including: Daily care of your teeth and gums. Regular physical activity. Eating a healthy diet. Avoiding tobacco and drug use. Limiting alcohol use. Practicing safe sex. Taking low-dose aspirin every day. Taking vitamin and mineral supplements as recommended by your health care provider. What happens during an annual well check? The services and screenings done by your health care provider during your annual well check will depend on your age, overall health, lifestyle risk factors, and family history of disease. Counseling   Your health care provider may ask you questions about your: Alcohol use. Tobacco use. Drug use. Emotional well-being. Home and relationship well-being. Sexual activity. Eating habits. History of falls. Memory and ability to understand (cognition). Work and work Astronomer. Reproductive health. Screening  You may have the following tests or measurements: Height, weight, and BMI. Blood pressure. Lipid and cholesterol levels. These may be checked every 5 years, or more frequently if you are over 21 years old. Skin check. Lung cancer screening. You may have this screening every year starting at age 69 if you have a 30-pack-year history of smoking and currently smoke or have quit within the past 15 years. Fecal occult blood test (FOBT) of the stool. You may have this test every year starting at age 80. Flexible sigmoidoscopy or colonoscopy. You may have a sigmoidoscopy every 5 years or a colonoscopy every 10 years starting at age 85. Hepatitis C blood test. Hepatitis B blood test. Sexually transmitted disease (STD) testing. Diabetes screening. This is done by checking your blood sugar (glucose) after you have not eaten for a while (fasting). You may have this done every 1-3 years. Bone density scan. This is done to screen for osteoporosis. You may have this done starting at age 39. Mammogram. This may be done every 1-2 years. Talk to your health care provider about how often you should have regular mammograms. Talk with your health care provider about your test results, treatment options, and if necessary, the need for more tests. Vaccines  Your health care provider may recommend certain vaccines, such as: Influenza vaccine. This is recommended every year. Tetanus, diphtheria, and acellular pertussis (Tdap, Td) vaccine. You may need a Td booster every 10 years. Zoster  vaccine. You may need this after age 64. Pneumococcal 13-valent conjugate (PCV13) vaccine. One dose is recommended  after age 37. Pneumococcal polysaccharide (PPSV23) vaccine. One dose is recommended after age 67. Talk to your health care provider about which screenings and vaccines you need and how often you need them. This information is not intended to replace advice given to you by your health care provider. Make sure you discuss any questions you have with your health care provider. Document Released: 07/18/2015 Document Revised: 03/10/2016 Document Reviewed: 04/22/2015 Elsevier Interactive Patient Education  2017 [employer].  Fall Prevention in the Home Falls can cause injuries. They can happen to people of all ages. There are many things you can do to make your home safe and to help prevent falls. What can I do on the outside of my home? Regularly fix the edges of walkways and driveways and fix any cracks. Remove anything that might make you trip as you walk through a door, such as a raised step or threshold. Trim any bushes or trees on the path to your home. Use bright outdoor lighting. Clear any walking paths of anything that might make someone trip, such as rocks or tools. Regularly check to see if handrails are loose or broken. Make sure that both sides of any steps have handrails. Any raised decks and porches should have guardrails on the edges. Have any leaves, snow, or ice cleared regularly. Use sand or salt on walking paths during winter. Clean up any spills in your garage right away. This includes oil or grease spills. What can I do in the bathroom? Use night lights. Install grab bars by the toilet and in the tub and shower. Do not use towel bars as grab bars. Use non-skid mats or decals in the tub or shower. If you need to sit down in the shower, use a plastic, non-slip stool. Keep the floor dry. Clean up any water that spills on the floor as soon as it happens. Remove soap buildup in the tub or shower regularly. Attach bath mats securely with double-sided non-slip rug tape. Do not  have throw rugs and other things on the floor that can make you trip. What can I do in the bedroom? Use night lights. Make sure that you have a light by your bed that is easy to reach. Do not use any sheets or blankets that are too big for your bed. They should not hang down onto the floor. Have a firm chair that has side arms. You can use this for support while you get dressed. Do not have throw rugs and other things on the floor that can make you trip. What can I do in the kitchen? Clean up any spills right away. Avoid walking on wet floors. Keep items that you use a lot in easy-to-reach places. If you need to reach something above you, use a strong step stool that has a grab bar. Keep electrical cords out of the way. Do not use floor polish or wax that makes floors slippery. If you must use wax, use non-skid floor wax. Do not have throw rugs and other things on the floor that can make you trip. What can I do with my stairs? Do not leave any items on the stairs. Make sure that there are handrails on both sides of the stairs and use them. Fix handrails that are broken or loose. Make sure that handrails are as long as the stairways. Check any carpeting to make sure that it  is firmly attached to the stairs. Fix any carpet that is loose or worn. Avoid having throw rugs at the top or bottom of the stairs. If you do have throw rugs, attach them to the floor with carpet tape. Make sure that you have a light switch at the top of the stairs and the bottom of the stairs. If you do not have them, ask someone to add them for you. What else can I do to help prevent falls? Wear shoes that: Do not have high heels. Have rubber bottoms. Are comfortable and fit you well. Are closed at the toe. Do not wear sandals. If you use a stepladder: Make sure that it is fully opened. Do not climb a closed stepladder. Make sure that both sides of the stepladder are locked into place. Ask someone to hold it for  you, if possible. Clearly mark and make sure that you can see: Any grab bars or handrails. First and last steps. Where the edge of each step is. Use tools that help you move around (mobility aids) if they are needed. These include: Canes. Walkers. Scooters. Crutches. Turn on the lights when you go into a dark area. Replace any light bulbs as soon as they burn out. Set up your furniture so you have a clear path. Avoid moving your furniture around. If any of your floors are uneven, fix them. If there are any pets around you, be aware of where they are. Review your medicines with your doctor. Some medicines can make you feel dizzy. This can increase your chance of falling. Ask your doctor what other things that you can do to help prevent falls. This information is not intended to replace advice given to you by your health care provider. Make sure you discuss any questions you have with your health care provider. Document Released: 04/17/2009 Document Revised: 11/27/2015 Document Reviewed: 07/26/2014 Elsevier Interactive Patient Education  2017 [employer].

## 2023-05-31 ENCOUNTER — Telehealth: Payer: Medicare HMO | Admitting: Nurse Practitioner

## 2023-05-31 ENCOUNTER — Telehealth: Payer: Medicare HMO | Admitting: Physician Assistant

## 2023-05-31 DIAGNOSIS — J018 Other acute sinusitis: Secondary | ICD-10-CM | POA: Diagnosis not present

## 2023-05-31 DIAGNOSIS — G43509 Persistent migraine aura without cerebral infarction, not intractable, without status migrainosus: Secondary | ICD-10-CM

## 2023-05-31 DIAGNOSIS — Z91199 Patient's noncompliance with other medical treatment and regimen due to unspecified reason: Secondary | ICD-10-CM

## 2023-05-31 MED ORDER — IPRATROPIUM BROMIDE 0.03 % NA SOLN: 2.0000 | Freq: Two times a day (BID) | NASAL | 12 refills | Status: DC

## 2023-05-31 NOTE — Progress Notes (Signed)
E-Visit for Sinus Problems  Kristi [name],  I apologize for the inconvenience we cannot do medication refills or treat migraines through E-visits.  Your options for tonight are to be seen at an Urgent Care in person (I will include addresses at the bottom) Message your primary care for a medication refill that they can address tomorrow  Schedule another video visit for tomorrow morning with the [facility]   I can provide you a work note for today and information on relieving sinus pressure that is adding to your migraine:  Based on what you have shared with me it looks like you have sinusitis.  Sinusitis is inflammation and infection in the sinus cavities of the head.  Based on your presentation I believe you most likely have Acute Viral Sinusitis.This is an infection most likely caused by a virus. There is not specific treatment for viral sinusitis other than to help you with the symptoms until the infection runs its course.  You may use an oral decongestant such as Mucinex D or if you have glaucoma or high blood pressure use plain Mucinex. Saline nasal spray help and can safely be used as often as needed for congestion, I have prescribed: Ipratropium Bromide nasal spray 0.03% 2 sprays in eah nostril 2-3 times a day  Some authorities believe that zinc sprays or the use of Echinacea may shorten the course of your symptoms.  Sinus infections are not as easily transmitted as other respiratory infection, however we still recommend that you avoid close contact with loved ones, especially the very young and elderly.  Remember to wash your hands thoroughly throughout the day as this is the number one way to prevent the spread of infection!  Home Care: Only take medications as instructed by your medical team. Do not take these medications with alcohol. A steam or ultrasonic humidifier can help congestion.  You can place a towel over your head and breathe in the steam from hot water coming from  a faucet. Avoid close contacts especially the very young and the elderly. Cover your mouth when you cough or sneeze. Always remember to wash your hands.  Get Help Right Away If: You develop worsening fever or sinus pain. You develop a severe head ache or visual changes. Your symptoms persist after you have completed your treatment plan.  Make sure you Understand these instructions. Will watch your condition. Will get help right away if you are not doing well or get worse.   Thank you for choosing an e-visit.  Your e-visit answers were reviewed by a board certified advanced clinical practitioner to complete your personal care plan. Depending upon the condition, your plan could have included both over the counter or prescription medications.  Please review your pharmacy choice. Make sure the pharmacy is open so you can pick up prescription now. If there is a problem, you may contact your provider through [employer] and have the prescription routed to another pharmacy.  Your safety is important to [place]. If you have drug allergies check your prescription carefully.   For the next 24 hours you can use MyChart to ask questions about today's visit, request a non-urgent call back, or ask for a work or school excuse. You will get an email in the next two days asking about your experience. I hope that your e-visit has been valuable and will speed your recovery.      For an urgent face to face visit, [place] has eight urgent care centers  for your convenience:   NEW!! [facility] at [facility] Get Driving Directions [phone] [address] [place], [address]    [facility] Urgent Care Center at [facility] Get Driving Directions [phone] [address] [place], [place] [address]   [facility] Center [facility]) Get Driving Directions [phone] [address] [place], [place] [address]  [facility] Center [facility]) Get Driving Directions [phone] [address] [place],  [place]  [address]  [facility] Urgent Care Center [facility] - at [employer]  [phone] [address] [place],  [place] [address]   [facility] at [facility] Get Driving Directions [phone] [address] [place], [place] [address]   [facility] Urgent Care at [facility] Get Driving Directions  [phone] [address] [place], [place] [address]   [facility] Health Urgent Care at [facility] Directions [phone] [address] [place], [place] [address]  Your MyChart E-visit questionnaire answers were reviewed by a board certified advanced clinical practitioner to complete your personal care plan based on your specific symptoms.  Thank you for using e-Visits.  I spent approximately 5 minutes reviewing the patient's history, current symptoms and coordinating their care today.

## 2023-05-31 NOTE — Progress Notes (Signed)
The patient no-showed for appointment despite this provider sending direct link with no response and waiting for at least 10 minutes from appointment time for patient to join. They will be marked as a NS for this appointment/time.   [name], PA-C

## 2023-06-14 ENCOUNTER — Other Ambulatory Visit: Payer: Self-pay | Admitting: Nurse Practitioner

## 2023-06-14 DIAGNOSIS — Z1231 Encounter for screening mammogram for malignant neoplasm of breast: Secondary | ICD-10-CM

## 2023-06-14 DIAGNOSIS — R7303 Prediabetes: Secondary | ICD-10-CM

## 2023-07-08 ENCOUNTER — Telehealth: Payer: Medicare HMO | Admitting: Family Medicine

## 2023-07-08 DIAGNOSIS — J040 Acute laryngitis: Secondary | ICD-10-CM

## 2023-07-08 NOTE — Progress Notes (Signed)
   Thank you for the details you included in the comment boxes. Those details are very helpful in determining the best course of treatment for you and help us  to provide the best care.Because Ms. Shafran, we recommend that you schedule a Virtual Urgent Care video visit in order for the provider to better assess what is going on.  The provider will be able to give you a more accurate diagnosis and treatment plan if we can more freely discuss your symptoms and with the addition of a virtual examination.   If you change your visit to a video visit, we will bill your insurance (similar to an office visit) and you will not be charged for this e-Visit. You will be able to stay at home and speak with the first available [facility] advanced practice provider. The link to do a video visit is in the drop down Menu tab of your Welcome screen in MyChart.

## 2023-07-09 ENCOUNTER — Telehealth: Payer: Medicare HMO

## 2023-07-09 DIAGNOSIS — J040 Acute laryngitis: Secondary | ICD-10-CM | POA: Diagnosis not present

## 2023-07-09 NOTE — Patient Instructions (Signed)
Laryngitis  Laryngitis is inflammation of the vocal cords that causes symptoms such as hoarseness or loss of voice. The vocal cords are two bands of muscles in your throat. When you speak, these cords come together and vibrate. The vibrations come out through your mouth as sound. When your vocal cords are inflamed, your voice sounds different. Laryngitis can be temporary (acute) or long-term (chronic). Most cases of acute laryngitis improve with time. Chronic laryngitis is laryngitis that lasts for more than 3 weeks. What are the causes? Acute laryngitis may be caused by: A viral infection. Lots of talking, yelling, or singing. This is also called vocal strain. A bacterial infection. Chronic laryngitis may be caused by: Vocal strain or an injury to the vocal cords. Acid reflux (gastroesophageal reflux disease, or GERD). Allergies, a sinus infection, or postnasal drip. Smoking. Excessive alcohol use. Breathing in chemicals or dust. Growths on the vocal cords. What increases the risk? The following factors may make you more likely to develop this condition: Smoking. Alcohol abuse. Having allergies. Chronic irritants in the workplace, such as toxic fumes. What are the signs or symptoms? Symptoms of this condition may include: Low, hoarse voice. Loss of voice. Dry cough. Sore or dry throat. Stuffy or congested nose. How is this diagnosed? This condition may be diagnosed based on: Your symptoms and a physical exam. Throat culture. Blood test. A procedure in which your health care provider looks at your vocal cords with a mirror or viewing tube (laryngoscopy). How is this treated? Treatment for laryngitis depends on what is causing it. Usually, treatment involves resting your voice and using medicines to soothe your throat. If your laryngitis is caused by a bacterial infection, you may need to take antibiotic medicine. If your laryngitis is caused by a growth, you may need to have a  procedure to remove it. Follow these instructions at home: Medicines Take over-the-counter and prescription medicines only as told by your health care provider. If you were prescribed an antibiotic medicine, take it as told by your health care provider. Do not stop taking the antibiotic even if you start to feel better. Use throat lozenges or sprays to soothe your throat as told by your health care provider. General instructions  Talk as little as possible. To do this: Write instead of talking. Do this until your voice is back to normal. Avoid whispering, which can cause vocal strain. Gargle with a mixture of salt and water 3-4 times a day or as needed. To make salt water, completely dissolve -1 tsp (3-6 g) of salt in 1 cup (237 mL) of warm water. Drink enough fluid to keep your urine pale yellow. Breathe in moist air. Use a humidifier if you live in a dry climate. Do not use any products that contain nicotine or tobacco. These products include cigarettes, chewing tobacco, and vaping devices, such as e-cigarettes. If you need help quitting, ask your health care provider. Contact a health care provider if: You have a fever. You have increasing pain. Your symptoms do not get better in 2 weeks. Get help right away if: You cough up blood. You have difficulty swallowing. You have trouble breathing. Summary Laryngitis is inflammation of the vocal cords that causes symptoms such as hoarseness or loss of voice. Laryngitis can be temporary or long-term. Treatment for laryngitis depends on the cause. It often involves resting your voice and using medicine to soothe your throat. Get help right away if you have difficulty swallowing or breathing or if you cough  up blood. This information is not intended to replace advice given to you by your health care provider. Make sure you discuss any questions you have with your health care provider. Document Revised: 09/08/2020 Document Reviewed:  09/08/2020 Elsevier Patient Education  2024 [employer].

## 2023-07-09 NOTE — Progress Notes (Signed)
 Virtual Visit Consent   Kristi [name], you are scheduled for a virtual visit with a [facility] provider today. Just as with appointments in the office, your consent must be obtained to participate. Your consent will be active for this visit and any virtual visit you may have with one of our providers in the next 365 days. If you have a MyChart account, a copy of this consent can be sent to you electronically.  As this is a virtual visit, video technology does not allow for your provider to perform a traditional examination. This may limit your provider's ability to fully assess your condition. If your provider identifies any concerns that need to be evaluated in person or the need to arrange testing (such as labs, EKG, etc.), we will make arrangements to do so. Although advances in technology are sophisticated, we cannot ensure that it will always work on either your end or our end. If the connection with a video visit is poor, the visit may have to be switched to a telephone visit. With either a video or telephone visit, we are not always able to ensure that we have a secure connection.  By engaging in this virtual visit, you consent to the provision of healthcare and authorize for your insurance to be billed (if applicable) for the services provided during this visit. Depending on your insurance coverage, you may receive a charge related to this service.  I need to obtain your verbal consent now. Are you willing to proceed with your visit today? Kristi [name] has provided verbal consent on 07/09/2023 for a virtual visit (video or telephone). Kristi Lamp, [name]  Date: 07/09/2023 10:59 AM  Virtual Visit via Video Note   I, Kristi [name], connected with  Kristi [name]  ([id number], 1956-04-08) on 07/09/23 at 11:00 AM EST by a video-enabled telemedicine application and verified that I am speaking with the correct person using two identifiers.  Location: Patient: Virtual Visit Location Patient:  Home Provider: Virtual Visit Location Provider: Home Office   I discussed the limitations of evaluation and management by telemedicine and the availability of in person appointments. The patient expressed understanding and agreed to proceed.    History of Present Illness: Kristi [name] is a 68 y.o. who identifies as a female who was assigned female at birth, and is being seen today for laryngitis since yesterday, slightly improved. 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 No fever or other sx. SABRA  HPI: HPI  Problems:  Patient Active Problem List   Diagnosis Date Noted   Migraine with aura and without status migrainosus, not intractable 10/05/2021   History of stomach ulcers 09/16/2021   Morbid obesity (HCC) 05/05/2021   Dyspnea 05/05/2021   Chest pain 02/24/2017   Atypical chest pain 05/10/2016   HTN (hypertension) 05/10/2016   Hypertensive emergency 05/10/2016   Anxiety 05/10/2016   Gastroesophageal reflux disease with esophagitis     Allergies:  Allergies  Allergen Reactions   Fish Allergy Anaphylaxis   Iodine Anaphylaxis   Sulfa Antibiotics Hives   Medications:  Current Outpatient Medications:    acetaminophen  (TYLENOL ) 650 MG CR tablet, Take 650 mg by mouth every 6 (six) hours as needed for pain., Disp: , Rfl:    albuterol  (VENTOLIN  HFA) 108 (90 Base) MCG/ACT inhaler, Inhale 2 puffs into the lungs every 6 (six) hours as needed for wheezing or shortness of breath., Disp: 18 g, Rfl: 0   amLODipine  (NORVASC ) 10 MG tablet, Take 1 tablet (10 mg total)  by mouth daily., Disp: 90 tablet, Rfl: 0   aspirin  EC 81 MG tablet, Take 1 tablet (81 mg total) by mouth daily. Swallow whole., Disp: 90 tablet, Rfl: 3   atorvastatin  (LIPITOR) 20 MG tablet, TAKE 1 TABLET BY MOUTH DAILY, Disp: 90 tablet, Rfl: 3   benzonatate  (TESSALON ) 100 MG capsule, Take 1-2 capsules (100-200 mg total) by mouth 3 (three) times daily as needed., Disp: 30 capsule, Rfl: 0   Blood Glucose Monitoring Suppl  (ONETOUCH VERIO) w/Device KIT, Use to test blood sugar twice daily. Dx: E11.69, Disp: 1 kit, Rfl: 0   cyanocobalamin  (VITAMIN B12) 1000 MCG tablet, Take 1,000 mcg by mouth daily., Disp: , Rfl:    diclofenac  (VOLTAREN ) 75 MG EC tablet, TAKE 1 TABLET BY MOUTH TWICE  DAILY, Disp: 180 tablet, Rfl: 3   fluticasone  (FLONASE ) 50 MCG/ACT nasal spray, Place 2 sprays into both nostrils daily., Disp: 16 g, Rfl: 0   gabapentin  (NEURONTIN ) 100 MG capsule, Take 1 capsule (100 mg total) by mouth at bedtime., Disp: 90 capsule, Rfl: 1   glucose blood (ONETOUCH VERIO) test strip, Use to test blood sugar twice daily. Dx: E11.69, Disp: 200 each, Rfl: 3   ipratropium (ATROVENT ) 0.03 % nasal spray, Place 2 sprays into both nostrils every 12 (twelve) hours., Disp: 30 mL, Rfl: 12   Lancets (ONETOUCH ULTRASOFT) lancets, Use to test blood sugar twice daily. Dx: E11.69, Disp: 200 each, Rfl: 3   Melatonin 10 MG TABS, Take 10 mg by mouth at bedtime as needed (Sleep)., Disp: , Rfl:    metFORMIN  (GLUCOPHAGE ) 500 MG tablet, TAKE 1 TABLET EVERY DAY WITH BREAKFAST, Disp: 90 tablet, Rfl: 3   Na Sulfate-K Sulfate-Mg Sulf 17.5-3.13-1.6 GM/177ML SOLN, Use as directed; may use generic; goodrx card if insurance will not cover generic, Disp: 354 mL, Rfl: 0   ondansetron  (ZOFRAN ) 4 MG tablet, Take 1 tablet (4 mg total) by mouth 3 (three) times daily as needed for nausea or vomiting., Disp: 30 tablet, Rfl: 1   pantoprazole  (PROTONIX ) 40 MG tablet, Take 1 tablet (40 mg total) by mouth every morning., Disp: 90 tablet, Rfl: 0   sodium chloride  (OCEAN) 0.65 % SOLN nasal spray, Place 1 spray into both nostrils daily as needed for congestion., Disp: , Rfl:    SUMAtriptan  (IMITREX ) 25 MG tablet, Take one tablet by mouth as needed for Headache. May repeat in 2 hours if headache persists or recurs. MAX 3 tablets/24 hours, Disp: 10 tablet, Rfl: 3   venlafaxine  XR (EFFEXOR -XR) 75 MG 24 hr capsule, Take 1 capsule (75 mg total) by mouth daily with  breakfast. PATIENT NEEDS TO SCHEDULE APPOINTMENT BEFORE ANY ADDITIONAL REFILLS, Disp: 90 capsule, Rfl: 0   Vitamin D , Ergocalciferol , (DRISDOL ) 1.25 MG (50000 UNIT) CAPS capsule, Take 1 capsule by mouth weekly., Disp: 12 capsule, Rfl: 2  Observations/Objective: Patient is well-developed, well-nourished in no acute distress.  Resting comfortably  at home.  Head is normocephalic, atraumatic.  No labored breathing.  Speech is clear and coherent with logical content.  Patient is alert and oriented at baseline.    Assessment and Plan: 1. Laryngitis (Primary)  Increase fluids, wrm salt water gargles, oow today.   Follow Up Instructions: I discussed the assessment and treatment plan with the patient. The patient was provided an opportunity to ask questions and all were answered. The patient agreed with the plan and demonstrated an understanding of the instructions.  A copy of instructions were sent to the patient via MyChart unless otherwise noted  below.     The patient was advised to call back or seek an in-person evaluation if the symptoms worsen or if the condition fails to improve as anticipated.    Arel Tippen, [name]

## 2023-07-11 ENCOUNTER — Telehealth: Payer: Medicare HMO | Admitting: Nurse Practitioner

## 2023-07-11 DIAGNOSIS — Z1231 Encounter for screening mammogram for malignant neoplasm of breast: Secondary | ICD-10-CM

## 2023-07-12 ENCOUNTER — Other Ambulatory Visit: Payer: Self-pay | Admitting: Nurse Practitioner

## 2023-07-18 ENCOUNTER — Telehealth: Payer: Medicare HMO | Admitting: Nurse Practitioner

## 2023-07-18 ENCOUNTER — Encounter: Payer: Self-pay | Admitting: Nurse Practitioner

## 2023-07-18 DIAGNOSIS — R0981 Nasal congestion: Secondary | ICD-10-CM | POA: Diagnosis not present

## 2023-07-18 DIAGNOSIS — G8929 Other chronic pain: Secondary | ICD-10-CM | POA: Diagnosis not present

## 2023-07-18 DIAGNOSIS — G2581 Restless legs syndrome: Secondary | ICD-10-CM

## 2023-07-18 DIAGNOSIS — G43109 Migraine with aura, not intractable, without status migrainosus: Secondary | ICD-10-CM | POA: Diagnosis not present

## 2023-07-18 DIAGNOSIS — M25512 Pain in left shoulder: Secondary | ICD-10-CM

## 2023-07-18 MED ORDER — GABAPENTIN 100 MG PO CAPS: 100.0000 mg | ORAL_CAPSULE | Freq: Every day | ORAL | 0 refills | Status: DC

## 2023-07-18 MED ORDER — VITAMIN D (ERGOCALCIFEROL) 1.25 MG (50000 UNIT) PO CAPS: 50000.0000 [IU] | ORAL_CAPSULE | ORAL | 0 refills | Status: DC

## 2023-07-18 MED ORDER — SUMATRIPTAN SUCCINATE 25 MG PO TABS: ORAL_TABLET | ORAL | 0 refills | Status: DC

## 2023-07-18 MED ORDER — FLUTICASONE PROPIONATE 50 MCG/ACT NA SUSP: 2.0000 | Freq: Every day | NASAL | 0 refills | Status: DC

## 2023-07-18 NOTE — Progress Notes (Signed)
 Careteam: Patient Care Team: Kristi Harlene POUR, NP as PCP - General (Geriatric Medicine)  Advanced Directive information Does Patient Have a Medical Advance Directive?: Yes, Type of Advance Directive: Living will;Healthcare Power of Attorney, Does patient want to make changes to medical advance directive?: No - Patient declined  Allergies  Allergen Reactions   Fish Allergy Anaphylaxis   Iodine Anaphylaxis   Sulfa Antibiotics Hives    Chief Complaint  Patient presents with   Acute Visit    Reoccurring sinus issues, restless legs, pain in shoulders and back of neck. Visit performed via telephone/video visit. Out of gabapentin  x 2 months. Discuss need for Vit B.      HPI: Patient is a 68 y.o. female due to recurring sinus congestion.  She uses nasal spray as needed Has an otc nasal decongestant  She does not blow her nose excessively  Has never used netipot  She has had had a refill of her gabapentin  and her legs are giving her problems.   She was placed on metformin  due to weight management- fasting blood sugar 90-105  She continues to have pain in left shoulder, limited ROM. She tries to move arm and is able to.  She does not have a car and limited transportation.  She does chair yoga and will move left shoulder with increase in effort.  Working on weight loss, has lost 7 lbs.  Review of Systems:  Review of Systems  Constitutional:  Negative for chills, fever and weight loss.  HENT:  Positive for congestion.   Respiratory:  Positive for shortness of breath (ongoing and stable).   Cardiovascular:  Negative for chest pain and palpitations.  Musculoskeletal:  Positive for back pain and joint pain. Negative for myalgias.  Neurological:  Positive for headaches. Negative for dizziness.    Past Medical History:  Diagnosis Date   Anemia    Angina at rest [facility])    Anxiety    Arthritis    Asthma    B12 deficiency    Back pain    Chest pain    Constipation     Depression    Edema of both lower extremities    Fatigue    Gallbladder problem    GERD (gastroesophageal reflux disease)    Glaucoma    History of stomach ulcers    History of stomach ulcers    Hyperlipidemia    Hypertension    Joint pain    Migraines    Multiple food allergies    Multiple food allergies    Obesity    Osteoarthritis    Pre-diabetes    Sleep apnea    SOB (shortness of breath)    Past Surgical History:  Procedure Laterality Date   ABDOMINAL HYSTERECTOMY     APPENDECTOMY     CHOLECYSTECTOMY     GALLBLADDER SURGERY  1989   Social History:   reports that she quit smoking about 17 years ago. Her smoking use included cigarettes. She has never used smokeless tobacco. She reports that she does not drink alcohol and does not use drugs.  Family History  Problem Relation Age of Onset   Stroke Mother    Hyperlipidemia Mother    Hypertension Mother    Heart disease Mother        3 CABG, stents placements.    Heart attack Mother        late 87s   Diabetes Mother    High Cholesterol Mother    Kidney disease  Mother    Depression Mother    Sleep apnea Mother    Alcoholism Mother    Obesity Mother    Cancer Father    Breast cancer Sister    Breast cancer Sister    Breast cancer Sister    Breast cancer Maternal Aunt    Diabetes Other    Heart disease Other     Medications: Patient's Medications  New Prescriptions   No medications on file  Previous Medications   ACETAMINOPHEN  (TYLENOL ) 650 MG CR TABLET    Take 650 mg by mouth every 6 (six) hours as needed for pain.   ALBUTEROL  (VENTOLIN  HFA) 108 (90 BASE) MCG/ACT INHALER    Inhale 2 puffs into the lungs every 6 (six) hours as needed for wheezing or shortness of breath.   AMLODIPINE  (NORVASC ) 10 MG TABLET    TAKE 1 TABLET EVERY DAY   ASPIRIN  EC 81 MG TABLET    Take 1 tablet (81 mg total) by mouth daily. Swallow whole.   ATORVASTATIN  (LIPITOR) 20 MG TABLET    TAKE 1 TABLET BY MOUTH DAILY   BENZONATATE   (TESSALON ) 100 MG CAPSULE    Take 1-2 capsules (100-200 mg total) by mouth 3 (three) times daily as needed.   BLOOD GLUCOSE MONITORING SUPPL (ONETOUCH VERIO) W/DEVICE KIT    Use to test blood sugar twice daily. Dx: E11.69   CYANOCOBALAMIN  (VITAMIN B12) 1000 MCG TABLET    Take 1,000 mcg by mouth daily.   DICLOFENAC  (VOLTAREN ) 75 MG EC TABLET    TAKE 1 TABLET BY MOUTH TWICE  DAILY   FLUTICASONE  (FLONASE ) 50 MCG/ACT NASAL SPRAY    Place 2 sprays into both nostrils daily.   GABAPENTIN  (NEURONTIN ) 100 MG CAPSULE    Take 1 capsule (100 mg total) by mouth at bedtime.   GLUCOSE BLOOD (ONETOUCH VERIO) TEST STRIP    Use to test blood sugar twice daily. Dx: E11.69   IPRATROPIUM (ATROVENT ) 0.03 % NASAL SPRAY    Place 2 sprays into both nostrils every 12 (twelve) hours.   LANCETS (ONETOUCH ULTRASOFT) LANCETS    Use to test blood sugar twice daily. Dx: E11.69   MELATONIN 10 MG TABS    Take 10 mg by mouth at bedtime as needed (Sleep).   METFORMIN  (GLUCOPHAGE ) 500 MG TABLET    TAKE 1 TABLET EVERY DAY WITH BREAKFAST   NA SULFATE-K SULFATE-MG SULF 17.5-3.13-1.6 GM/177ML SOLN    Use as directed; may use generic; goodrx card if insurance will not cover generic   ONDANSETRON  (ZOFRAN ) 4 MG TABLET    Take 1 tablet (4 mg total) by mouth 3 (three) times daily as needed for nausea or vomiting.   PANTOPRAZOLE  (PROTONIX ) 40 MG TABLET    TAKE 1 TABLET EVERY MORNING   SODIUM CHLORIDE  (OCEAN) 0.65 % SOLN NASAL SPRAY    Place 1 spray into both nostrils daily as needed for congestion.   SUMATRIPTAN  (IMITREX ) 25 MG TABLET    Take one tablet by mouth as needed for Headache. May repeat in 2 hours if headache persists or recurs. MAX 3 tablets/24 hours   VENLAFAXINE  XR (EFFEXOR -XR) 75 MG 24 HR CAPSULE    TAKE 1 CAPSULE EVERY DAY WITH BREAKFAST. NEED TO SCHEDULE APPOINTMENT BEFORE ANY ADDITIONAL REFILLS   VITAMIN D , ERGOCALCIFEROL , (DRISDOL ) 1.25 MG (50000 UNIT) CAPS CAPSULE    Take 1 capsule by mouth weekly.  Modified Medications   No  medications on file  Discontinued Medications   No medications on file  Physical Exam:  There were no vitals filed for this visit. There is no height or weight on file to calculate BMI. Wt Readings from Last 3 Encounters:  10/13/22 (!) 323 lb (146.5 kg)  09/03/22 (!) 321 lb (145.6 kg)  04/13/22 (!) 311 lb (141.1 kg)    Physical Exam Constitutional:      Appearance: Normal appearance.  Pulmonary:     Effort: Pulmonary effort is normal.  Neurological:     Mental Status: She is alert. Mental status is at baseline.  Psychiatric:        Mood and Affect: Mood normal.     Labs reviewed: Basic Metabolic Panel: Recent Labs    09/03/22 1117  NA 142  K 4.4  CL 101  CO2 30  GLUCOSE 86  BUN 9  CREATININE 0.71  CALCIUM  9.3   Liver Function Tests: Recent Labs    09/03/22 1117  AST 15  ALT 12  BILITOT 0.3  PROT 7.2   No results for input(s): LIPASE, AMYLASE in the last 8760 hours. No results for input(s): AMMONIA in the last 8760 hours. CBC: Recent Labs    09/03/22 1117  WBC 7.4  NEUTROABS 4,951  HGB 13.3  HCT 41.9  MCV 82.3  PLT 300   Lipid Panel: Recent Labs    09/03/22 1117  CHOL 210*  HDL 57  LDLCALC 124*  TRIG 176*  CHOLHDL 3.7   TSH: No results for input(s): TSH in the last 8760 hours. A1C: Lab Results  Component Value Date   HGBA1C 5.9 (H) 09/03/2022     Assessment/Plan 1. RLS (restless legs syndrome) -worse since out of medication, refill given today - gabapentin  (NEURONTIN ) 100 MG capsule; Take 1 capsule (100 mg total) by mouth at bedtime.  Dispense: 90 capsule; Refill: 0  2. Migraine with aura and without status migrainosus, not intractable - SUMAtriptan  (IMITREX ) 25 MG tablet; Take one tablet by mouth as needed for Headache. May repeat in 2 hours if headache persists or recurs. MAX 3 tablets/24 hours  Dispense: 10 tablet; Refill: 0  3. Nasal congestion (Primary) -to use netipot daily -avoid forcefully blowing nose.  -  fluticasone  (FLONASE ) 50 MCG/ACT nasal spray; Place 2 sprays into both nostrils daily.  Dispense: 16 g; Refill: 0  4. Chronic left shoulder pain Ongoing, has not gotten imaging She would benefit from PT but declines at this time   Next appt: 08/01/2023 for in-person visit and labs [place] K. Kristi BODILY  [facility] [phone]    Virtual Visit via video  I connected with patient on 07/18/23 at  3:00 PM EST by mychart and verified that I am speaking with the correct person using two identifiers.  Location: Patient: home Provider: [facility]   I discussed the limitations, risks, security and privacy concerns of performing an evaluation and management service by telephone and the availability of in person appointments. I also discussed with the patient that there may be a patient responsible charge related to this service. The patient expressed understanding and agreed to proceed.   I discussed the assessment and treatment plan with the patient. The patient was provided an opportunity to ask questions and all were answered. The patient agreed with the plan and demonstrated an understanding of the instructions.   The patient was advised to call back or seek an in-person evaluation if the symptoms worsen or if the condition fails to improve as anticipated.  I provided 25 minutes of non-face-to-face time during  this encounter.  Raj Landress K. Kristi BODILY Avs printed and mailed

## 2023-07-18 NOTE — Progress Notes (Signed)
   This service is provided via telemedicine  No vital signs collected/recorded due to the encounter was a telemedicine visit.   Location of patient (ex: home, work):  Home  Patient consents to a telephone visit: Yes  Location of the provider (ex: office, home):  [facility], Office   Name of any referring provider:  N/A  Names of all persons participating in the telemedicine service and their role in the encounter:  S.Chrae B/CMA, Abbey Chatters, NP, and Patient   Time spent on call:  9 min with medical assistant

## 2023-08-01 ENCOUNTER — Ambulatory Visit (INDEPENDENT_AMBULATORY_CARE_PROVIDER_SITE_OTHER): Payer: Medicare HMO | Admitting: Nurse Practitioner

## 2023-08-01 ENCOUNTER — Encounter: Payer: Self-pay | Admitting: Nurse Practitioner

## 2023-08-01 VITALS — BP 134/86 | HR 97 | Temp 97.1°F | Ht 65.0 in | Wt 334.0 lb

## 2023-08-01 DIAGNOSIS — G629 Polyneuropathy, unspecified: Secondary | ICD-10-CM

## 2023-08-01 DIAGNOSIS — I10 Essential (primary) hypertension: Secondary | ICD-10-CM

## 2023-08-01 DIAGNOSIS — E1142 Type 2 diabetes mellitus with diabetic polyneuropathy: Secondary | ICD-10-CM

## 2023-08-01 DIAGNOSIS — R0981 Nasal congestion: Secondary | ICD-10-CM | POA: Diagnosis not present

## 2023-08-01 DIAGNOSIS — G2581 Restless legs syndrome: Secondary | ICD-10-CM | POA: Diagnosis not present

## 2023-08-01 DIAGNOSIS — Z23 Encounter for immunization: Secondary | ICD-10-CM

## 2023-08-01 DIAGNOSIS — E538 Deficiency of other specified B group vitamins: Secondary | ICD-10-CM | POA: Diagnosis not present

## 2023-08-01 DIAGNOSIS — E559 Vitamin D deficiency, unspecified: Secondary | ICD-10-CM | POA: Diagnosis not present

## 2023-08-01 DIAGNOSIS — F339 Major depressive disorder, recurrent, unspecified: Secondary | ICD-10-CM

## 2023-08-01 DIAGNOSIS — G8929 Other chronic pain: Secondary | ICD-10-CM

## 2023-08-01 DIAGNOSIS — M25512 Pain in left shoulder: Secondary | ICD-10-CM

## 2023-08-01 MED ORDER — BUPROPION HCL ER (XL) 150 MG PO TB24: 150.0000 mg | ORAL_TABLET | Freq: Every day | ORAL | 1 refills | Status: DC

## 2023-08-01 NOTE — Progress Notes (Signed)
Careteam: Patient Care Team: Sharon Seller, NP as PCP - General (Geriatric Medicine)  PLACE OF SERVICE:  [facility]  Advanced Directive information    Allergies  Allergen Reactions   Fish Allergy Anaphylaxis   Iodine Anaphylaxis   Other     Seafood altogether    Sulfa Antibiotics Hives    Chief Complaint  Patient presents with   Medical Management of Chronic Issues    Routine visit. Discuss need for flu vaccine, covid booster, and mammogram. Scored 20 on depression screening.      HPI: Patient is a 68 y.o. female here today for routine follow up.  Discussed the use of AI scribe software for clinical note transcription with the patient, who gave verbal consent to proceed.  History of Present Illness   Ms Wolman, a patient with a complex medical history including hypertension, hyperlipidemia, arthritis, neuropathic pain, diabetes, gastroesophageal reflux disease, migraines, anxiety, depression, and vitamin D deficiency, presents for a follow-up visit. The patient reports ongoing sinus congestion, which has slightly improved with the use of a neti pot and over-the-counter medications including Mucinex and Sudafed. She also reports chronic neck pain and recent onset of left shoulder pain with limited range of motion, which has been present for about a month.  The patient has restarted gabapentin for restless leg syndrome, which has been beneficial. She is also on pantoprazole for acid reflux associated with a hiatal hernia    She reports taking Effexor for depression, but expresses feelings of hopelessness and past suicidal ideation, although no recent plans or attempts.  The patient's weight has increased since the last visit, and she expresses difficulty in maintaining a healthy diet due to financial constraints and gastrointestinal intolerance to certain foods.   She reports shortness of breath, which is a chronic issue and has not recently worsened.  The patient has a  history of vitamin D and B12 deficiencies, and is overdue for blood work. She also has a history of prediabetes and is concerned about her weight. She expresses willingness to consider injectable medication for weight loss.  The patient reports difficulty sleeping, but has not needed to use melatonin for sleep aid since last summer. She also reports a history of urinary issues, but no current complaints were discussed during this visit.      Review of Systems:  Review of Systems  Constitutional:  Negative for chills, fever and weight loss.  HENT:  Negative for tinnitus.   Respiratory:  Negative for cough, sputum production and shortness of breath.   Cardiovascular:  Negative for chest pain, palpitations and leg swelling.  Gastrointestinal:  Negative for abdominal pain, constipation, diarrhea and heartburn.  Genitourinary:  Negative for dysuria, frequency and urgency.  Musculoskeletal:  Positive for back pain, joint pain and myalgias. Negative for falls.  Skin: Negative.   Neurological:  Negative for dizziness and headaches.  Psychiatric/Behavioral:  Negative for depression and memory loss. The patient does not have insomnia.     Past Medical History:  Diagnosis Date   Anemia    Angina at rest [facility])    Anxiety    Arthritis    Asthma    B12 deficiency    Back pain    Chest pain    Constipation    Depression    Edema of both lower extremities    Fatigue    Gallbladder problem    GERD (gastroesophageal reflux disease)    Glaucoma    History of stomach ulcers  History of stomach ulcers    Hyperlipidemia    Hypertension    Joint pain    Migraines    Multiple food allergies    Multiple food allergies    Obesity    Osteoarthritis    Pre-diabetes    Sleep apnea    SOB (shortness of breath)    Past Surgical History:  Procedure Laterality Date   ABDOMINAL HYSTERECTOMY     APPENDECTOMY     CHOLECYSTECTOMY     GALLBLADDER SURGERY  1989   Social History:   reports that  she quit smoking about 17 years ago. Her smoking use included cigarettes. She has never used smokeless tobacco. She reports that she does not drink alcohol and does not use drugs.  Family History  Problem Relation Age of Onset   Stroke Mother    Hyperlipidemia Mother    Hypertension Mother    Heart disease Mother        3 CABG, stents placements.    Heart attack Mother        late 92s   Diabetes Mother    High Cholesterol Mother    Kidney disease Mother    Depression Mother    Sleep apnea Mother    Alcoholism Mother    Obesity Mother    Cancer Father    Breast cancer Sister    Breast cancer Sister    Breast cancer Sister    Breast cancer Maternal Aunt    Diabetes Other    Heart disease Other     Medications: Patient's Medications  New Prescriptions   No medications on file  Previous Medications   ACETAMINOPHEN (TYLENOL) 650 MG CR TABLET    Take 650 mg by mouth every 6 (six) hours as needed for pain.   ALBUTEROL (VENTOLIN HFA) 108 (90 BASE) MCG/ACT INHALER    Inhale 2 puffs into the lungs every 6 (six) hours as needed for wheezing or shortness of breath.   AMLODIPINE (NORVASC) 10 MG TABLET    TAKE 1 TABLET EVERY DAY   ASPIRIN EC 81 MG TABLET    Take 1 tablet (81 mg total) by mouth daily. Swallow whole.   ATORVASTATIN (LIPITOR) 20 MG TABLET    TAKE 1 TABLET BY MOUTH DAILY   BLOOD GLUCOSE MONITORING SUPPL (ONETOUCH VERIO) W/DEVICE KIT    Use to test blood sugar twice daily. Dx: E11.69   DICLOFENAC (VOLTAREN) 75 MG EC TABLET    TAKE 1 TABLET BY MOUTH TWICE  DAILY   FLUTICASONE (FLONASE) 50 MCG/ACT NASAL SPRAY    Place 2 sprays into both nostrils daily.   GABAPENTIN (NEURONTIN) 100 MG CAPSULE    Take 1 capsule (100 mg total) by mouth at bedtime.   GLUCOSE BLOOD (ONETOUCH VERIO) TEST STRIP    Use to test blood sugar twice daily. Dx: E11.69   LANCETS (ONETOUCH ULTRASOFT) LANCETS    Use to test blood sugar twice daily. Dx: E11.69   MELATONIN 10 MG TABS    Take 10 mg by mouth at  bedtime as needed (Sleep).   METFORMIN (GLUCOPHAGE) 500 MG TABLET    TAKE 1 TABLET EVERY DAY WITH BREAKFAST   MULTIPLE VITAMIN (MULTIVITAMIN) TABLET    Take 1 tablet by mouth daily.   PANTOPRAZOLE (PROTONIX) 40 MG TABLET    TAKE 1 TABLET EVERY MORNING   SODIUM CHLORIDE (OCEAN) 0.65 % SOLN NASAL SPRAY    Place 1 spray into both nostrils daily as needed for congestion.   SUMATRIPTAN (IMITREX)  25 MG TABLET    Take one tablet by mouth as needed for Headache. May repeat in 2 hours if headache persists or recurs. MAX 3 tablets/24 hours   VENLAFAXINE XR (EFFEXOR-XR) 75 MG 24 HR CAPSULE    TAKE 1 CAPSULE EVERY DAY WITH BREAKFAST. NEED TO SCHEDULE APPOINTMENT BEFORE ANY ADDITIONAL REFILLS   VITAMIN D, ERGOCALCIFEROL, (DRISDOL) 1.25 MG (50000 UNIT) CAPS CAPSULE    Take 1 capsule (50,000 Units total) by mouth once a week.  Modified Medications   No medications on file  Discontinued Medications   No medications on file    Physical Exam:  Vitals:   08/01/23 1414  BP: 134/86  Pulse: 97  Temp: (!) 97.1 F (36.2 C)  TempSrc: Temporal  SpO2: 99%  Weight: (!) 334 lb (151.5 kg)  Height: 5\' 5"  (1.651 m)   Body mass index is 55.58 kg/m. Wt Readings from Last 3 Encounters:  08/01/23 (!) 334 lb (151.5 kg)  10/13/22 (!) 323 lb (146.5 kg)  09/03/22 (!) 321 lb (145.6 kg)    Physical Exam Constitutional:      General: She is not in acute distress.    Appearance: She is well-developed. She is not diaphoretic.  HENT:     Head: Normocephalic and atraumatic.     Mouth/Throat:     Pharynx: No oropharyngeal exudate.  Eyes:     Conjunctiva/sclera: Conjunctivae normal.     Pupils: Pupils are equal, round, and reactive to light.  Cardiovascular:     Rate and Rhythm: Normal rate and regular rhythm.     Heart sounds: Normal heart sounds.  Pulmonary:     Effort: Pulmonary effort is normal.     Breath sounds: Normal breath sounds.  Abdominal:     General: Bowel sounds are normal.     Palpations:  Abdomen is soft.  Musculoskeletal:     Cervical back: Normal range of motion and neck supple.     Right lower leg: No edema.     Left lower leg: No edema.  Skin:    General: Skin is warm and dry.  Neurological:     Mental Status: She is alert.  Psychiatric:        Mood and Affect: Mood normal.     Labs reviewed: Basic Metabolic Panel: Recent Labs    09/03/22 1117  NA 142  K 4.4  CL 101  CO2 30  GLUCOSE 86  BUN 9  CREATININE 0.71  CALCIUM 9.3   Liver Function Tests: Recent Labs    09/03/22 1117  AST 15  ALT 12  BILITOT 0.3  PROT 7.2   No results for input(s): "LIPASE", "AMYLASE" in the last 8760 hours. No results for input(s): "AMMONIA" in the last 8760 hours. CBC: Recent Labs    09/03/22 1117  WBC 7.4  NEUTROABS 4,951  HGB 13.3  HCT 41.9  MCV 82.3  PLT 300   Lipid Panel: Recent Labs    09/03/22 1117  CHOL 210*  HDL 57  LDLCALC 124*  TRIG 176*  CHOLHDL 3.7   TSH: No results for input(s): "TSH" in the last 8760 hours. A1C: Lab Results  Component Value Date   HGBA1C 5.9 (H) 09/03/2022     Assessment/Plan Assessment and Plan    Major Depressive Disorder Recent worsening of depressive symptoms with history of suicidal ideation and attempts. Currently on Effexor 75mg  daily. No current plan for self-harm. -Add Wellbutrin to current regimen for adjunctive therapy. -Refer to psychology for therapy. -Schedule  follow-up appointment in 4 weeks to assess mood and medication side effects. -Provided emergency contact number for immediate assistance if suicidal ideation intensifies.  Chronic Sinus Congestion Some improvement with use of neti pot and over-the-counter medications. -Continue current regimen and maintain consistency.  Morbid Obesity BMI of 55. Difficulty with dietary modifications due to financial constraints and gastrointestinal intolerance to certain foods. -Encourage portion control and focus on protein intake. -Advise on  increasing physical activity as tolerated. -Consider adding Ozempic for weight loss if blood work indicates worsening of prediabetes.  Hypertension Well controlled on Norvasc. -Continue current regimen.  Hyperlipidemia On Lipitor. -Order lipid panel to assess control.  Osteoarthritis Chronic pain managed with diclofenac. -Order labs to monitor for potential side effects of diclofenac. -Consider physical therapy referral pending results of x-rays for chronic left shoulder pain.  Vitamin D and B12 Deficiency -Order labs to assess current levels.  Gastroesophageal Reflux Disease Managed with pantoprazole and encourage dietary modifications  -Continue current regimen.  Restless Leg Syndrome Improved with gabapentin 100mg  at bedtime. -Continue current regimen.  General Health Maintenance -Administer influenza vaccine today. -Schedule mammogram.  4 weeks for depression   Marlayna Bannister K. [facility]ag[facility] [phone]

## 2023-08-01 NOTE — Patient Instructions (Addendum)
[facility]  [address] [place], [place] [address] Phone: [phone]  [facility] [address] [place], [place] [address] Phone: [phone]  [facility] [address] [place], [place] [address] Phone [phone]  [email or web address] all virtual

## 2023-08-02 ENCOUNTER — Encounter: Payer: Self-pay | Admitting: Nurse Practitioner

## 2023-08-02 LAB — COMPLETE METABOLIC PANEL WITH GFR
AG Ratio: 1.4 (calc) (ref 1.0–2.5)
ALT: 11 U/L (ref 6–29)
AST: 15 U/L (ref 10–35)
Albumin: 4.1 g/dL (ref 3.6–5.1)
Alkaline phosphatase (APISO): 72 U/L (ref 37–153)
BUN: 8 mg/dL (ref 7–25)
CO2: 31 mmol/L (ref 20–32)
Calcium: 9.3 mg/dL (ref 8.6–10.4)
Chloride: 102 mmol/L (ref 98–110)
Creat: 0.68 mg/dL (ref 0.50–1.05)
Globulin: 3 g/dL (ref 1.9–3.7)
Glucose, Bld: 81 mg/dL (ref 65–99)
Potassium: 4.3 mmol/L (ref 3.5–5.3)
Sodium: 141 mmol/L (ref 135–146)
Total Bilirubin: 0.4 mg/dL (ref 0.2–1.2)
Total Protein: 7.1 g/dL (ref 6.1–8.1)
eGFR: 95 mL/min/{1.73_m2} (ref >=60)

## 2023-08-02 LAB — CBC WITH DIFFERENTIAL/PLATELET
Absolute Lymphocytes: 1822 {cells}/uL (ref 850–3900)
Absolute Monocytes: 529 {cells}/uL (ref 200–950)
Basophils Absolute: 27 {cells}/uL (ref 0–200)
Basophils Relative: 0.4 %
Eosinophils Absolute: 87 {cells}/uL (ref 15–500)
Eosinophils Relative: 1.3 %
HCT: 40.2 % (ref 35.0–45.0)
Hemoglobin: 12.6 g/dL (ref 11.7–15.5)
MCH: 25.9 pg — ABNORMAL LOW (ref 27.0–33.0)
MCHC: 31.3 g/dL — ABNORMAL LOW (ref 32.0–36.0)
MCV: 82.5 fL (ref 80.0–100.0)
MPV: 10.2 fL (ref 7.5–12.5)
Monocytes Relative: 7.9 %
Neutro Abs: 4234 {cells}/uL (ref 1500–7800)
Neutrophils Relative %: 63.2 %
Platelets: 287 10*3/uL (ref 140–400)
RBC: 4.87 10*6/uL (ref 3.80–5.10)
RDW: 13.6 % (ref 11.0–15.0)
Total Lymphocyte: 27.2 %
WBC: 6.7 10*3/uL (ref 3.8–10.8)

## 2023-08-02 LAB — HEMOGLOBIN A1C
Hgb A1c MFr Bld: 6.1 %{Hb} — ABNORMAL HIGH (ref <5.7)
Mean Plasma Glucose: 128 mg/dL
eAG (mmol/L): 7.1 mmol/L

## 2023-08-02 LAB — LIPID PANEL
Cholesterol: 221 mg/dL — ABNORMAL HIGH (ref <200)
HDL: 58 mg/dL (ref >=50)
LDL Cholesterol (Calc): 132 mg/dL — ABNORMAL HIGH
Non-HDL Cholesterol (Calc): 163 mg/dL — ABNORMAL HIGH (ref <130)
Total CHOL/HDL Ratio: 3.8 (calc) (ref <5.0)
Triglycerides: 179 mg/dL — ABNORMAL HIGH (ref <150)

## 2023-08-02 LAB — VITAMIN B12: Vitamin B-12: 1068 pg/mL (ref 200–1100)

## 2023-08-02 LAB — VITAMIN D 25 HYDROXY (VIT D DEFICIENCY, FRACTURES): Vit D, 25-Hydroxy: 60 ng/mL (ref 30–100)

## 2023-08-03 ENCOUNTER — Other Ambulatory Visit: Payer: Self-pay

## 2023-08-03 DIAGNOSIS — E782 Mixed hyperlipidemia: Secondary | ICD-10-CM

## 2023-08-03 MED ORDER — ATORVASTATIN CALCIUM 40 MG PO TABS: 40.0000 mg | ORAL_TABLET | Freq: Every day | ORAL | 1 refills | Status: DC

## 2023-08-20 ENCOUNTER — Other Ambulatory Visit: Payer: Self-pay | Admitting: Nurse Practitioner

## 2023-08-20 DIAGNOSIS — G43109 Migraine with aura, not intractable, without status migrainosus: Secondary | ICD-10-CM

## 2023-08-20 DIAGNOSIS — R0981 Nasal congestion: Secondary | ICD-10-CM

## 2023-08-22 ENCOUNTER — Encounter (HOSPITAL_COMMUNITY): Payer: Self-pay | Admitting: Psychiatry

## 2023-08-22 ENCOUNTER — Telehealth (HOSPITAL_COMMUNITY): Payer: Medicare HMO | Admitting: Psychiatry

## 2023-08-22 VITALS — Wt 334.0 lb

## 2023-08-22 DIAGNOSIS — F411 Generalized anxiety disorder: Secondary | ICD-10-CM | POA: Diagnosis not present

## 2023-08-22 DIAGNOSIS — F331 Major depressive disorder, recurrent, moderate: Secondary | ICD-10-CM

## 2023-08-22 DIAGNOSIS — F4310 Post-traumatic stress disorder, unspecified: Secondary | ICD-10-CM

## 2023-08-22 DIAGNOSIS — F1210 Cannabis abuse, uncomplicated: Secondary | ICD-10-CM
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
## 2023-08-22 NOTE — Telephone Encounter (Signed)
Please approve refills. Medications pend and sent to PCP Janyth Contes Janene Harvey, NP

## 2023-08-29 ENCOUNTER — Ambulatory Visit (INDEPENDENT_AMBULATORY_CARE_PROVIDER_SITE_OTHER): Payer: Medicare HMO | Admitting: Nurse Practitioner

## 2023-08-29 VITALS — BP 130/84 | HR 98 | Temp 97.7°F | Ht 65.0 in | Wt 329.0 lb

## 2023-08-29 DIAGNOSIS — F419 Anxiety disorder, unspecified: Secondary | ICD-10-CM | POA: Diagnosis not present

## 2023-08-29 DIAGNOSIS — F339 Major depressive disorder, recurrent, unspecified: Secondary | ICD-10-CM

## 2023-08-29 NOTE — Progress Notes (Unsigned)
 Careteam: Patient Care Team: Kristi Seller, NP as PCP - General (Geriatric Medicine)   PLACE OF SERVICE: [facility] CLINIC  Advanced Directive information     Allergies  Allergen Reactions   Fish Allergy Anaphylaxis   Iodine Anaphylaxis   Other     Seafood altogether    Sulfa Antibiotics Hives     Chief Complaint  Patient presents with   Depression    Follow-up on Wellbutrin. Scored 10 on Depression screening, previously scored 20.     HPI: Patient is a 68 y.o. female presents for depression follow-up. Taking Wellbutrin and Effexor. 3 weeks since started on Wellbutrin. Feels like this combination is working.  Works from home.  Has appointment with therapist coming up. Saw Dr. Kathi Ludwig, states he told her she also has PTSD and anxiety.  Is trying to eat better, not able to exercise, states she doesn't have side walks near her house. Has a goal to lose weight so she can visit her family in [place] Has improved on her activity around the house.   Vaping Delta-8 at night, denies alcohol use. Insomnia is improving.  Denies suicidal ideations.   Review of Systems:  Review of Systems  Psychiatric/Behavioral:  Positive for depression. Negative for suicidal ideas. The patient is nervous/anxious and has insomnia.     Past Medical History:  Diagnosis Date   Anemia    [facility]    Anxiety    Arthritis    Asthma    B12 deficiency    Back pain    Chest pain    Constipation    Depression    Edema of both lower extremities    Fatigue    Gallbladder problem    GERD (gastroesophageal reflux disease)    Glaucoma    History of stomach ulcers    History of stomach ulcers    Hyperlipidemia    Hypertension    Joint pain    Migraines    Multiple food allergies    Multiple food allergies    Obesity    Osteoarthritis    Pre-diabetes    Sleep apnea    SOB (shortness of breath)     Past Surgical History:  Procedure Laterality Date   ABDOMINAL HYSTERECTOMY      APPENDECTOMY     CHOLECYSTECTOMY     GALLBLADDER SURGERY  1989     Social History:   reports that she quit smoking about 17 years ago. Her smoking use included cigarettes. She has never used smokeless tobacco. She reports that she does not drink alcohol and does not use drugs.  Family History  Problem Relation Age of Onset   Stroke Mother    Hyperlipidemia Mother    Hypertension Mother    Heart disease Mother        3 CABG, stents placements.    Heart attack Mother        late 13s   Diabetes Mother    High Cholesterol Mother    Kidney disease Mother    Depression Mother    Sleep apnea Mother    Alcoholism Mother    Obesity Mother    Cancer Father    Breast cancer Sister    Breast cancer Sister    Breast cancer Sister    Breast cancer Maternal Aunt    Diabetes Other    Heart disease Other      Medications:  Patient's Medications  New Prescriptions   No medications on file  Previous Medications   ACETAMINOPHEN (TYLENOL) 650 MG CR TABLET    Take 650 mg by mouth every 6 (six) hours as needed for pain.   ALBUTEROL (VENTOLIN HFA) 108 (90 BASE) MCG/ACT INHALER    Inhale 2 puffs into the lungs every 6 (six) hours as needed for wheezing or shortness of breath.   AMLODIPINE (NORVASC) 10 MG TABLET    TAKE 1 TABLET EVERY DAY   ASPIRIN EC 81 MG TABLET    Take 1 tablet (81 mg total) by mouth daily. Swallow whole.   ATORVASTATIN (LIPITOR) 40 MG TABLET    Take 1 tablet (40 mg total) by mouth daily.   BLOOD GLUCOSE MONITORING SUPPL (ONETOUCH VERIO) W/DEVICE KIT    Use to test blood sugar twice daily. Dx: E11.69   BUPROPION (WELLBUTRIN XL) 150 MG 24 HR TABLET    Take 1 tablet (150 mg total) by mouth daily.   DICLOFENAC (VOLTAREN) 75 MG EC TABLET    TAKE 1 TABLET BY MOUTH TWICE  DAILY   FLUTICASONE (FLONASE) 50 MCG/ACT NASAL SPRAY    PLACE 2 SPRAYS INTO BOTH NOSTRILS DAILY.   GABAPENTIN (NEURONTIN) 100 MG CAPSULE    Take 1 capsule (100 mg total) by mouth at bedtime.   GLUCOSE BLOOD  (ONETOUCH VERIO) TEST STRIP    Use to test blood sugar twice daily. Dx: E11.69   LANCETS (ONETOUCH ULTRASOFT) LANCETS    Use to test blood sugar twice daily. Dx: E11.69   MELATONIN 10 MG TABS    Take 10 mg by mouth at bedtime as needed (Sleep).   METFORMIN (GLUCOPHAGE) 500 MG TABLET    TAKE 1 TABLET EVERY DAY WITH BREAKFAST   MULTIPLE VITAMIN (MULTIVITAMIN) TABLET    Take 1 tablet by mouth daily.   PANTOPRAZOLE (PROTONIX) 40 MG TABLET    TAKE 1 TABLET EVERY MORNING   SODIUM CHLORIDE (OCEAN) 0.65 % SOLN NASAL SPRAY    Place 1 spray into both nostrils daily as needed for congestion.   SUMATRIPTAN (IMITREX) 25 MG TABLET    TAKE 1 TABLET AS NEEDED FOR HEADACHE. MAY REPEAT IN 2 HOURS IF HEADACHE PERSISTS OR RECURS. MAX 3 TABS/24 HOURS   VENLAFAXINE XR (EFFEXOR-XR) 75 MG 24 HR CAPSULE    TAKE 1 CAPSULE EVERY DAY WITH BREAKFAST. NEED TO SCHEDULE APPOINTMENT BEFORE ANY ADDITIONAL REFILLS   VITAMIN D, ERGOCALCIFEROL, (DRISDOL) 1.25 MG (50000 UNIT) CAPS CAPSULE    Take 1 capsule (50,000 Units total) by mouth once a week.  Modified Medications   No medications on file  Discontinued Medications   No medications on file     Physical Exam:  Vitals:   08/29/23 1409  BP: 130/84  Pulse: 98  Temp: 97.7 F (36.5 C)  TempSrc: Temporal  SpO2: 97%  Weight: (!) 329 lb (149.2 kg)  Height: 5\' 5"  (1.651 m)   Body mass index is 54.75 kg/m.  Wt Readings from Last 3 Encounters:  08/29/23 (!) 329 lb (149.2 kg)  08/01/23 (!) 334 lb (151.5 kg)  10/13/22 (!) 323 lb (146.5 kg)     Physical Exam Constitutional:      General: She is not in acute distress.    Appearance: She is obese. She is not ill-appearing.  Cardiovascular:     Rate and Rhythm: Normal rate and regular rhythm.     Pulses: Normal pulses.     Heart sounds: Normal heart sounds.  Pulmonary:     Effort: Pulmonary effort is normal.     Breath  sounds: Normal breath sounds.  Neurological:     Mental Status: She is alert and oriented to person,  place, and time. Mental status is at baseline.  Psychiatric:        Mood and Affect: Mood normal.        Behavior: Behavior normal.     Labs reviewed: Basic Metabolic Panel:  Recent Labs    09/03/22 1117 08/01/23 1502  NA 142 141  K 4.4 4.3  CL 101 102  CO2 30 31  GLUCOSE 86 81  BUN 9 8  CREATININE 0.71 0.68  CALCIUM 9.3 9.3   Liver Function Tests:  Recent Labs    09/03/22 1117 08/01/23 1502  AST 15 15  ALT 12 11  BILITOT 0.3 0.4  PROT 7.2 7.1   No results for input(s): "LIPASE", "AMYLASE" in the last 8760 hours. No results for input(s): "AMMONIA" in the last 8760 hours. CBC:  Recent Labs    09/03/22 1117 08/01/23 1502  WBC 7.4 6.7  NEUTROABS 4,951 4,234  HGB 13.3 12.6  HCT 41.9 40.2  MCV 82.3 82.5  PLT 300 287   Lipid Panel:  Recent Labs    09/03/22 1117 08/01/23 1502  CHOL 210* 221*  HDL 57 58  LDLCALC 124* 132*  TRIG 176* 179*  CHOLHDL 3.7 3.8   TSH: No results for input(s): "TSH" in the last 8760 hours. A1C:  Lab Results  Component Value Date   HGBA1C 6.1 (H) 08/01/2023     Assessment/Plan   1. Depression, recurrent (HCC) (Primary) -Symptoms improving, scored 10 on depression screening (20 previously) -Continue current medications  -Continue care with therapist and psychiatrist  -Encouraged cessation of vaping  -Denies SI  2. Anxiety -Symptoms improving -Continue current medications  -Continue care with therapist and psychiatrist  -Encouraged cessation of vaping  Return in about 3 months (around 11/26/2023) for routine follow up, labs at time of visit.  Rollen Sox, Haroldine Laws MSN-[name] Student -I personally was present during the history, physical exam and medical decision-making activities of this service and have verified that the service and findings are accurately documented in the student's note Ayano Douthitt K. Biagio [facility] [phone]

## 2023-08-30 ENCOUNTER — Encounter: Payer: Self-pay | Admitting: Nurse Practitioner

## 2023-09-08 ENCOUNTER — Other Ambulatory Visit: Payer: Self-pay

## 2023-09-08 DIAGNOSIS — M150 Primary generalized (osteo)arthritis: Secondary | ICD-10-CM

## 2023-09-08 DIAGNOSIS — R0981 Nasal congestion: Secondary | ICD-10-CM

## 2023-09-08 DIAGNOSIS — F339 Major depressive disorder, recurrent, unspecified: Secondary | ICD-10-CM

## 2023-09-08 MED ORDER — DICLOFENAC SODIUM 75 MG PO TBEC: 75.0000 mg | DELAYED_RELEASE_TABLET | Freq: Two times a day (BID) | ORAL | 3 refills | Status: DC

## 2023-09-08 MED ORDER — BUPROPION HCL ER (XL) 150 MG PO TB24: 150.0000 mg | ORAL_TABLET | Freq: Every day | ORAL | 1 refills | Status: DC

## 2023-09-20 ENCOUNTER — Encounter (HOSPITAL_COMMUNITY): Payer: Self-pay | Admitting: Psychiatry

## 2023-09-20 ENCOUNTER — Telehealth (HOSPITAL_COMMUNITY): Payer: Self-pay | Admitting: Psychiatry

## 2023-09-20 VITALS — Wt 329.0 lb

## 2023-09-20 DIAGNOSIS — F1210 Cannabis abuse, uncomplicated: Secondary | ICD-10-CM

## 2023-09-20 DIAGNOSIS — F331 Major depressive disorder, recurrent, moderate: Secondary | ICD-10-CM | POA: Diagnosis not present

## 2023-09-20 DIAGNOSIS — F4310 Post-traumatic stress disorder, unspecified: Secondary | ICD-10-CM | POA: Diagnosis not present

## 2023-09-20 NOTE — Progress Notes (Signed)
 [facility] MD Virtual Progress Note   Patient Location: Home Provider Location: Home Office  I connect with patient by video and verified that I am speaking with correct person by using two identifiers. I discussed the limitations of evaluation and management by telemedicine and the availability of in person appointments. I also discussed with the patient that there may be a patient responsible charge related to this service. The patient expressed understanding and agreed to proceed.  Kristi [name] [id number] 68 y.o.  09/20/2023 11:04 AM  History of Present Illness:  Patient is a 68 year old divorced employed female who was seen first time 6 weeks ago.  She was referred from primary care following management of psychiatric symptoms.  She is struggled with anxiety, depression, PTSD symptoms.  Her PCP started her on venlafaxine and added Wellbutrin.  We have recommended to consider therapy and patient has appointment coming up in few weeks.  She reported her depression is somewhat better.  She feel more motivation and does not ruminates as much and like to get better.  Patient told last week she was very down and sad because she had a fall after getting up from the bed and she stayed on the floor for 20 minutes and feeling very helpless and worthless.  However she talked to herself and decided not to let her down with these episodes.  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  She admitted no help from the kids and despite telling her younger kids multiple times he does not want to work or make any effort to start business or get a job.  Her older son lives okay and makes good money.  Patient reported chronic insomnia due to nightmares, bad dreams and sometimes she has to wake up to go to the bathroom.  Recently she had a visit with primary care but no new medication added.  She denies any crying spells or any suicidal thoughts.  She is  more hopeful and wants to get better.  So far no concern from the medication and she has no tremor or shakes or any EPS.  She has cut down her cannabis use because it was causing breathing issues and she decided this at the time to focus on her physical health and significantly cut down the cannabis use.  Patient told her sibling lives in [place] and she do talk to them frequently.  Children father lives in [place].  Past Psychiatric History: History of depression, anxiety, PTSD.  History of 3 suicidal attempt by overdose and driving in front of opposite traffic.  Took Paxil, Zoloft, Lexapro while living up [place].  She was without medication for many years until started taking medication in 2024.  History of alcohol use in her 30s.  No history of DUI.   Outpatient Encounter Medications as of 09/20/2023  Medication Sig   acetaminophen (TYLENOL) 650 MG CR tablet Take 650 mg by mouth every 6 (six) hours as needed for pain.   albuterol (VENTOLIN HFA) 108 (90 Base) MCG/ACT inhaler Inhale 2 puffs into the lungs every 6 (six) hours as needed for wheezing or shortness of breath.   amLODipine (NORVASC) 10 MG tablet TAKE 1 TABLET EVERY DAY   aspirin EC 81 MG tablet Take 1 tablet (81 mg total) by mouth daily. Swallow whole.   atorvastatin (LIPITOR) 40 MG tablet Take 1 tablet (40 mg total) by mouth daily.   Blood Glucose Monitoring Suppl (  ONETOUCH VERIO) w/Device KIT Use to test blood sugar twice daily. Dx: E11.69   buPROPion (WELLBUTRIN XL) 150 MG 24 hr tablet Take 1 tablet (150 mg total) by mouth daily.   diclofenac (VOLTAREN) 75 MG EC tablet Take 1 tablet (75 mg total) by mouth 2 (two) times daily.   fluticasone (FLONASE) 50 MCG/ACT nasal spray PLACE 2 SPRAYS INTO BOTH NOSTRILS DAILY.   gabapentin (NEURONTIN) 100 MG capsule Take 1 capsule (100 mg total) by mouth at bedtime.   glucose blood (ONETOUCH VERIO) test strip Use to test blood sugar twice daily. Dx: E11.69   Lancets (ONETOUCH ULTRASOFT) lancets  Use to test blood sugar twice daily. Dx: E11.69   Melatonin 10 MG TABS Take 10 mg by mouth at bedtime as needed (Sleep).   metFORMIN (GLUCOPHAGE) 500 MG tablet TAKE 1 TABLET EVERY DAY WITH BREAKFAST   Multiple Vitamin (MULTIVITAMIN) tablet Take 1 tablet by mouth daily.   pantoprazole (PROTONIX) 40 MG tablet TAKE 1 TABLET EVERY MORNING   sodium chloride (OCEAN) 0.65 % SOLN nasal spray Place 1 spray into both nostrils daily as needed for congestion.   SUMAtriptan (IMITREX) 25 MG tablet TAKE 1 TABLET AS NEEDED FOR HEADACHE. MAY REPEAT IN 2 HOURS IF HEADACHE PERSISTS OR RECURS. MAX 3 TABS/24 HOURS   venlafaxine XR (EFFEXOR-XR) 75 MG 24 hr capsule TAKE 1 CAPSULE EVERY DAY WITH BREAKFAST. NEED TO SCHEDULE APPOINTMENT BEFORE ANY ADDITIONAL REFILLS   Vitamin D, Ergocalciferol, (DRISDOL) 1.25 MG (50000 UNIT) CAPS capsule Take 1 capsule (50,000 Units total) by mouth once a week.   No facility-administered encounter medications on file as of 09/20/2023.    Recent Results (from the past 2160 hours)  Lipid panel     Status: Abnormal   Collection Time: 08/01/23  3:02 PM  Result Value Ref Range   Cholesterol 221 (H) <200 mg/dL   HDL 58 > OR = 50 mg/dL   Triglycerides 130 (H) <150 mg/dL   LDL Cholesterol (Calc) 132 (H) mg/dL (calc)    Comment: Reference range: <100 . Desirable range <100 mg/dL for primary prevention;   <70 mg/dL for patients with CHD or diabetic patients  with > or = 2 CHD risk factors. Marland Kitchen LDL-C is now calculated using the Martin-Hopkins  calculation, which is a validated novel method providing  better accuracy than the Friedewald equation in the  estimation of LDL-C.  Horald Pollen et al. Lenox Ahr. 8657;846(96): 2061-2068  (http://education.[facility].com/faq/FAQ164)    Total CHOL/HDL Ratio 3.8 <5.0 (calc)   Non-HDL Cholesterol (Calc) 163 (H) <130 mg/dL (calc)    Comment: For patients with diabetes plus 1 major ASCVD risk  factor, treating to a non-HDL-C goal of <100 mg/dL  (LDL-C  of <29 mg/dL) is considered a therapeutic  option.   COMPLETE METABOLIC PANEL WITH GFR     Status: None   Collection Time: 08/01/23  3:02 PM  Result Value Ref Range   Glucose, Bld 81 65 - 99 mg/dL    Comment: .            Fasting reference interval .    BUN 8 7 - 25 mg/dL   Creat 5.28 4.13 - 2.44 mg/dL   eGFR 95 > OR = 60 WN/UUV/2.53G6   BUN/Creatinine Ratio SEE NOTE: 6 - 22 (calc)    Comment:    Not Reported: BUN and Creatinine are within    reference range. .    Sodium 141 135 - 146 mmol/L   Potassium 4.3 3.5 - 5.3 mmol/L  Chloride 102 98 - 110 mmol/L   CO2 31 20 - 32 mmol/L   Calcium 9.3 8.6 - 10.4 mg/dL   Total Protein 7.1 6.1 - 8.1 g/dL   Albumin 4.1 3.6 - 5.1 g/dL   Globulin 3.0 1.9 - 3.7 g/dL (calc)   AG Ratio 1.4 1.0 - 2.5 (calc)   Total Bilirubin 0.4 0.2 - 1.2 mg/dL   Alkaline phosphatase (APISO) 72 37 - 153 U/L   AST 15 10 - 35 U/L   ALT 11 6 - 29 U/L  CBC with Differential/Platelet     Status: Abnormal   Collection Time: 08/01/23  3:02 PM  Result Value Ref Range   WBC 6.7 3.8 - 10.8 Thousand/uL   RBC 4.87 3.80 - 5.10 Million/uL   Hemoglobin 12.6 11.7 - 15.5 g/dL   HCT 14.7 82.9 - 56.2 %   MCV 82.5 80.0 - 100.0 fL   MCH 25.9 (L) 27.0 - 33.0 pg   MCHC 31.3 (L) 32.0 - 36.0 g/dL    Comment: For adults, a slight decrease in the calculated MCHC value (in the range of 30 to 32 g/dL) is most likely not clinically significant; however, it should be interpreted with caution in correlation with other red cell parameters and the patient's clinical condition.    RDW 13.6 11.0 - 15.0 %   Platelets 287 140 - 400 Thousand/uL   MPV 10.2 7.5 - 12.5 fL   Neutro Abs 4,234 1,500 - 7,800 cells/uL   Absolute Lymphocytes 1,822 850 - 3,900 cells/uL   Absolute Monocytes 529 200 - 950 cells/uL   Eosinophils Absolute 87 15 - 500 cells/uL   Basophils Absolute 27 0 - 200 cells/uL   Neutrophils Relative % 63.2 %   Total Lymphocyte 27.2 %   Monocytes Relative 7.9 %    Eosinophils Relative 1.3 %   Basophils Relative 0.4 %  Hemoglobin A1c     Status: Abnormal   Collection Time: 08/01/23  3:02 PM  Result Value Ref Range   Hgb A1c MFr Bld 6.1 (H) <5.7 % of total Hgb    Comment: For someone without known diabetes, a hemoglobin  A1c value between 5.7% and 6.4% is consistent with prediabetes and should be confirmed with a  follow-up test. . For someone with known diabetes, a value <7% indicates that their diabetes is well controlled. A1c targets should be individualized based on duration of diabetes, age, comorbid conditions, and other considerations. . This assay result is consistent with an increased risk of diabetes. . Currently, no consensus exists regarding use of hemoglobin A1c for diagnosis of diabetes for children. .    Mean Plasma Glucose 128 mg/dL   eAG (mmol/L) 7.1 mmol/L  Vitamin B12     Status: None   Collection Time: 08/01/23  3:02 PM  Result Value Ref Range   Vitamin B-12 1,068 200 - 1,100 pg/mL  Vitamin D, 25-hydroxy     Status: None   Collection Time: 08/01/23  3:02 PM  Result Value Ref Range   Vit D, 25-Hydroxy 60 30 - 100 ng/mL    Comment: Vitamin D Status         25-OH Vitamin D: . Deficiency:                    <20 ng/mL Insufficiency:             20 - 29 ng/mL Optimal:                 >  or = 30 ng/mL . For 25-OH Vitamin D testing on patients on  D2-supplementation and patients for whom quantitation  of D2 and D3 fractions is required, the [facility](TM) 25-OH VIT D, (D2,D3), LC/MS/MS is recommended: order  code 16109 (patients >25yrs). . See Note 1 . Note 1 . For additional information, please refer to  http://education.[facility].com/faq/FAQ199  (This link is being provided for informational/ educational purposes only.)      Psychiatric Specialty Exam: Physical Exam  Review of Systems  Musculoskeletal:  Positive for back pain.    Weight (!) 329 lb (149.2 kg).There is no height or weight on file  to calculate BMI.  General Appearance: Casual  Eye Contact:  Good  Speech:  Clear and Coherent  Volume:  Normal  Mood:  Anxious and Dysphoric  Affect:  Congruent  Thought Process:  Goal Directed  Orientation:  Full (Time, Place, and Person)  Thought Content:  Rumination  Suicidal Thoughts:  No  Homicidal Thoughts:  No  Memory:  Immediate;   Good Recent;   Good Remote;   Good  Judgement:  Intact  Insight:  Present  Psychomotor Activity:  Decreased  Concentration:  Concentration: Good and Attention Span: Good  Recall:  Good  Fund of Knowledge:  Good  Language:  Good  Akathisia:  No  Handed:  Right  AIMS (if indicated):     Assets:  Communication Skills Desire for Improvement Housing Talents/Skills Transportation  ADL's:  Intact  Cognition:  WNL  Sleep:  fair. Need to wake to go bathroom. Still nightmares       09/20/2023   11:10 AM 08/29/2023    2:11 PM 08/22/2023    1:57 PM 08/01/2023    2:21 PM 05/20/2023    2:06 PM  Depression screen PHQ 2/9  Decreased Interest 0 1 1 3  0  Down, Depressed, Hopeless 1 1 3 2  0  PHQ - 2 Score 1 2 4 5  0  Altered sleeping 0 2 3 3    Tired, decreased energy 1 1 3 3    Change in appetite 0 3 3 3    Feeling bad or failure about yourself  1 2 2 3    Trouble concentrating 0 0 0 2   Moving slowly or fidgety/restless 0 0 0 0   Suicidal thoughts 1 0 1 1   PHQ-9 Score 4 10 16 20    Difficult doing work/chores Somewhat difficult  Somewhat difficult       Assessment/Plan: MDD (major depressive disorder), recurrent episode, moderate (HCC)  PTSD (post-traumatic stress disorder)  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  Discussed psychosocial stressors.  She is doing better with the combination of Wellbutrin XL 150 in the morning and venlafaxine 75 mg daily.  She has cut down her cannabis use.  Encouraged to give appointment with therapist.  Reviewed  notes from provider.  She takes some time melatonin 10 mg that helps sleep.  She is also on gabapentin for her chronic pain in her back.  Discussed to keep the current medication as recently filled by primary care.  Overall she is doing better and once she started therapy hoping to have a better response and help her symptoms.  If symptoms do not improve further we will consider adjusting the dose of antidepressant.  Follow-up in 3 months unless patient requested an earlier appointment.  I review PHQ and numbers are improved from the past.   Follow Up Instructions:  I discussed the assessment and treatment plan with the patient. The patient was provided an opportunity to ask questions and all were answered. The patient agreed with the plan and demonstrated an understanding of the instructions.   The patient was advised to call back or seek an in-person evaluation if the symptoms worsen or if the condition fails to improve as anticipated.    Collaboration of Care: Other provider involved in patient's care AEB notes are available in epic to review  Patient/Guardian was advised Release of Information must be obtained prior to any record release in order to collaborate their care with an outside provider. Patient/Guardian was advised if they have not already done so to contact the registration department to sign all necessary forms in order for [place] to release information regarding their care.   Consent: Patient/Guardian gives verbal consent for treatment and assignment of benefits for services provided during this visit. Patient/Guardian expressed understanding and agreed to proceed.     I provided 31 minutes of non face to face time during this encounter.  Note: This document was prepared by [employer] voice dictation technology and any errors that results from this process are unintentional.    Cleotis Nipper, MD 09/20/2023

## 2023-09-24 ENCOUNTER — Other Ambulatory Visit: Payer: Self-pay | Admitting: Nurse Practitioner

## 2023-09-26 ENCOUNTER — Ambulatory Visit (HOSPITAL_COMMUNITY): Payer: Self-pay | Admitting: Licensed Clinical Social Worker

## 2023-09-27 ENCOUNTER — Telehealth: Admitting: Physician Assistant

## 2023-09-27 DIAGNOSIS — J069 Acute upper respiratory infection, unspecified: Secondary | ICD-10-CM | POA: Diagnosis not present

## 2023-09-27 DIAGNOSIS — R062 Wheezing: Secondary | ICD-10-CM

## 2023-09-27 MED ORDER — PREDNISONE 20 MG PO TABS: 40.0000 mg | ORAL_TABLET | Freq: Every day | ORAL | 0 refills | Status: DC

## 2023-09-27 MED ORDER — BENZONATATE 100 MG PO CAPS: 100.0000 mg | ORAL_CAPSULE | Freq: Three times a day (TID) | ORAL | 0 refills | Status: AC | PRN

## 2023-09-27 NOTE — Progress Notes (Signed)
 Virtual Visit Consent   Kristi [name], you are scheduled for a virtual visit with a [facility] provider today. Just as with appointments in the office, your consent must be obtained to participate. Your consent will be active for this visit and any virtual visit you may have with one of our providers in the next 365 days. If you have a MyChart account, a copy of this consent can be sent to you electronically.  As this is a virtual visit, video technology does not allow for your provider to perform a traditional examination. This may limit your provider's ability to fully assess your condition. If your provider identifies any concerns that need to be evaluated in person or the need to arrange testing (such as labs, EKG, etc.), we will make arrangements to do so. Although advances in technology are sophisticated, we cannot ensure that it will always work on either your end or our end. If the connection with a video visit is poor, the visit may have to be switched to a telephone visit. With either a video or telephone visit, we are not always able to ensure that we have a secure connection.  By engaging in this virtual visit, you consent to the provision of healthcare and authorize for your insurance to be billed (if applicable) for the services provided during this visit. Depending on your insurance coverage, you may receive a charge related to this service.  I need to obtain your verbal consent now. Are you willing to proceed with your visit today? Kristi [name] has provided verbal consent on 09/27/2023 for a virtual visit (video or telephone). Kristi [name], [place]  Date: 09/27/2023 4:13 PM   Virtual Visit via Video Note   I, Kristi [name], connected with  Kristi [name]  ([id number], [birth year]) on 09/27/23 at  4:15 PM EDT by a video-enabled telemedicine application and verified that I am speaking with the correct person using two identifiers.  Location: Patient: Virtual Visit  Location Patient: Home Provider: Virtual Visit Location Provider: Home Office   I discussed the limitations of evaluation and management by telemedicine and the availability of in person appointments. The patient expressed understanding and agreed to proceed.    History of Present Illness: Kristi [name] is a 68 y.o. who identifies as a female who was assigned female at birth, and is being seen today for URI symptoms worsening over the past 2 days with chest congestion and persistent cough that is mainly dry but associated with some chest tightness/wheezing. Denies fever, chills. Notes sinus pressure and sinus pain. Denies fever, chills. Denies recent travel or sick contact.   HPI: HPI  Problems:  Patient Active Problem List   Diagnosis Date Noted   Migraine with aura and without status migrainosus, not intractable 10/05/2021   History of stomach ulcers 09/16/2021   Morbid obesity (HCC) 05/05/2021   Dyspnea 05/05/2021   Chest pain 02/24/2017   Atypical chest pain 05/10/2016   HTN (hypertension) 05/10/2016   Hypertensive emergency 05/10/2016   Anxiety 05/10/2016   Gastroesophageal reflux disease with esophagitis     Allergies:  Allergies  Allergen Reactions   Fish Allergy Anaphylaxis   Iodine Anaphylaxis   Other     Seafood altogether    Sulfa Antibiotics Hives   Medications:  Current Outpatient Medications:    benzonatate (TESSALON) 100 MG capsule, Take 1 capsule (100 mg total) by mouth 3 (three) times daily as needed for cough., Disp: 30 capsule, Rfl: 0   predniSONE (DELTASONE) 20 MG  tablet, Take 2 tablets (40 mg total) by mouth daily with breakfast., Disp: 10 tablet, Rfl: 0   acetaminophen (TYLENOL) 650 MG CR tablet, Take 650 mg by mouth every 6 (six) hours as needed for pain., Disp: , Rfl:    albuterol (VENTOLIN HFA) 108 (90 Base) MCG/ACT inhaler, Inhale 2 puffs into the lungs every 6 (six) hours as needed for wheezing or shortness of breath., Disp: 18 g, Rfl: 0   amLODipine  (NORVASC) 10 MG tablet, TAKE 1 TABLET EVERY DAY, Disp: 90 tablet, Rfl: 1   aspirin EC 81 MG tablet, Take 1 tablet (81 mg total) by mouth daily. Swallow whole., Disp: 90 tablet, Rfl: 3   atorvastatin (LIPITOR) 40 MG tablet, Take 1 tablet (40 mg total) by mouth daily., Disp: 90 tablet, Rfl: 1   Blood Glucose Monitoring Suppl (ONETOUCH VERIO) w/Device KIT, Use to test blood sugar twice daily. Dx: E11.69, Disp: 1 kit, Rfl: 0   buPROPion (WELLBUTRIN XL) 150 MG 24 hr tablet, Take 1 tablet (150 mg total) by mouth daily., Disp: 30 tablet, Rfl: 1   diclofenac (VOLTAREN) 75 MG EC tablet, Take 1 tablet (75 mg total) by mouth 2 (two) times daily., Disp: 180 tablet, Rfl: 3   fluticasone (FLONASE) 50 MCG/ACT nasal spray, PLACE 2 SPRAYS INTO BOTH NOSTRILS DAILY., Disp: 16 g, Rfl: 11   gabapentin (NEURONTIN) 100 MG capsule, Take 1 capsule (100 mg total) by mouth at bedtime., Disp: 90 capsule, Rfl: 0   glucose blood (ONETOUCH VERIO) test strip, Use to test blood sugar twice daily. Dx: E11.69, Disp: 200 each, Rfl: 3   Lancets (ONETOUCH ULTRASOFT) lancets, Use to test blood sugar twice daily. Dx: E11.69, Disp: 200 each, Rfl: 3   Melatonin 10 MG TABS, Take 10 mg by mouth at bedtime as needed (Sleep)., Disp: , Rfl:    metFORMIN (GLUCOPHAGE) 500 MG tablet, TAKE 1 TABLET EVERY DAY WITH BREAKFAST, Disp: 90 tablet, Rfl: 3   Multiple Vitamin (MULTIVITAMIN) tablet, Take 1 tablet by mouth daily., Disp: , Rfl:    pantoprazole (PROTONIX) 40 MG tablet, TAKE 1 TABLET EVERY MORNING, Disp: 90 tablet, Rfl: 3   sodium chloride (OCEAN) 0.65 % SOLN nasal spray, Place 1 spray into both nostrils daily as needed for congestion., Disp: , Rfl:    SUMAtriptan (IMITREX) 25 MG tablet, TAKE 1 TABLET AS NEEDED FOR HEADACHE. MAY REPEAT IN 2 HOURS IF HEADACHE PERSISTS OR RECURS. MAX 3 TABS/24 HOURS, Disp: 9 tablet, Rfl: 11   venlafaxine XR (EFFEXOR-XR) 75 MG 24 hr capsule, TAKE 1 CAPSULE EVERY DAY WITH BREAKFAST. NEED TO SCHEDULE APPOINTMENT BEFORE  ANY ADDITIONAL REFILLS, Disp: 90 capsule, Rfl: 1   Vitamin D, Ergocalciferol, (DRISDOL) 1.25 MG (50000 UNIT) CAPS capsule, TAKE 1 CAPSULE ONCE A WEEK., Disp: 12 capsule, Rfl: 3  Observations/Objective: Patient is well-developed, well-nourished in no acute distress.  Resting comfortably at home.  Head is normocephalic, atraumatic.  No labored breathing. Speech is clear and coherent with logical content.  Patient is alert and oriented at baseline.   Assessment and Plan: 1. Wheezing (Primary) - benzonatate (TESSALON) 100 MG capsule; Take 1 capsule (100 mg total) by mouth 3 (three) times daily as needed for cough.  Dispense: 30 capsule; Refill: 0 - predniSONE (DELTASONE) 20 MG tablet; Take 2 tablets (40 mg total) by mouth daily with breakfast.  Dispense: 10 tablet; Refill: 0  2. Acute URI - benzonatate (TESSALON) 100 MG capsule; Take 1 capsule (100 mg total) by mouth 3 (three) times daily as  needed for cough.  Dispense: 30 capsule; Refill: 0  She is to take home COVID/flu test as a precaution and let us know ASAP if positive. Supportive measures and OTC medications. Continue Mucinex and regular medications. Will add on prednisone burst for significant wheezing.  Tessalon per orders. Strict in person evaluation precautions reviewed.   Follow Up Instructions: I discussed the assessment and treatment plan with the patient. The patient was provided an opportunity to ask questions and all were answered. The patient agreed with the plan and demonstrated an understanding of the instructions.  A copy of instructions were sent to the patient via MyChart unless otherwise noted below.   The patient was advised to call back or seek an in-person evaluation if the symptoms worsen or if the condition fails to improve as anticipated.    Kristi Climes, PA-C

## 2023-09-27 NOTE — Patient Instructions (Signed)
 Anniya Broadwell, thank you for joining [name], PA-C for today's virtual visit.  While this provider is not your primary care provider (PCP), if your PCP is located in our provider database this encounter information will be shared with them immediately following your visit.   A [facility] MyChart account gives you access to today's visit and all your visits, tests, and labs performed at [facility] " click here if you don't have a [facility] MyChart account or go to [email or web address]  Consent: (Patient) Hurley Gombert provided verbal consent for this virtual visit at the beginning of the encounter.  Current Medications:  Current Outpatient Medications:    acetaminophen (TYLENOL) 650 MG CR tablet, Take 650 mg by mouth every 6 (six) hours as needed for pain., Disp: , Rfl:    albuterol (VENTOLIN HFA) 108 (90 Base) MCG/ACT inhaler, Inhale 2 puffs into the lungs every 6 (six) hours as needed for wheezing or shortness of breath., Disp: 18 g, Rfl: 0   amLODipine (NORVASC) 10 MG tablet, TAKE 1 TABLET EVERY DAY, Disp: 90 tablet, Rfl: 1   aspirin EC 81 MG tablet, Take 1 tablet (81 mg total) by mouth daily. Swallow whole., Disp: 90 tablet, Rfl: 3   atorvastatin (LIPITOR) 40 MG tablet, Take 1 tablet (40 mg total) by mouth daily., Disp: 90 tablet, Rfl: 1   Blood Glucose Monitoring Suppl (ONETOUCH VERIO) w/Device KIT, Use to test blood sugar twice daily. Dx: E11.69, Disp: 1 kit, Rfl: 0   buPROPion (WELLBUTRIN XL) 150 MG 24 hr tablet, Take 1 tablet (150 mg total) by mouth daily., Disp: 30 tablet, Rfl: 1   diclofenac (VOLTAREN) 75 MG EC tablet, Take 1 tablet (75 mg total) by mouth 2 (two) times daily., Disp: 180 tablet, Rfl: 3   fluticasone (FLONASE) 50 MCG/ACT nasal spray, PLACE 2 SPRAYS INTO BOTH NOSTRILS DAILY., Disp: 16 g, Rfl: 11   gabapentin (NEURONTIN) 100 MG capsule, Take 1 capsule (100 mg total) by mouth at bedtime., Disp: 90 capsule, Rfl: 0   glucose blood  (ONETOUCH VERIO) test strip, Use to test blood sugar twice daily. Dx: E11.69, Disp: 200 each, Rfl: 3   Lancets (ONETOUCH ULTRASOFT) lancets, Use to test blood sugar twice daily. Dx: E11.69, Disp: 200 each, Rfl: 3   Melatonin 10 MG TABS, Take 10 mg by mouth at bedtime as needed (Sleep)., Disp: , Rfl:    metFORMIN (GLUCOPHAGE) 500 MG tablet, TAKE 1 TABLET EVERY DAY WITH BREAKFAST, Disp: 90 tablet, Rfl: 3   Multiple Vitamin (MULTIVITAMIN) tablet, Take 1 tablet by mouth daily., Disp: , Rfl:    pantoprazole (PROTONIX) 40 MG tablet, TAKE 1 TABLET EVERY MORNING, Disp: 90 tablet, Rfl: 3   sodium chloride (OCEAN) 0.65 % SOLN nasal spray, Place 1 spray into both nostrils daily as needed for congestion., Disp: , Rfl:    SUMAtriptan (IMITREX) 25 MG tablet, TAKE 1 TABLET AS NEEDED FOR HEADACHE. MAY REPEAT IN 2 HOURS IF HEADACHE PERSISTS OR RECURS. MAX 3 TABS/24 HOURS, Disp: 9 tablet, Rfl: 11   venlafaxine XR (EFFEXOR-XR) 75 MG 24 hr capsule, TAKE 1 CAPSULE EVERY DAY WITH BREAKFAST. NEED TO SCHEDULE APPOINTMENT BEFORE ANY ADDITIONAL REFILLS, Disp: 90 capsule, Rfl: 1   Vitamin D, Ergocalciferol, (DRISDOL) 1.25 MG (50000 UNIT) CAPS capsule, TAKE 1 CAPSULE ONCE A WEEK., Disp: 12 capsule, Rfl: 3   Medications ordered in this encounter:  No orders of the defined types were placed in this encounter.    *If you need refills on other  medications prior to your next appointment, please contact your pharmacy*  Follow-Up: Call back or seek an in-person evaluation if the symptoms worsen or if the condition fails to improve as anticipated.  [facility] [phone]  Other Instructions Please take a home flu/COVID test as discussed. Message [place] back directly if this is positive for either. Keep hydrated and rest.  Ok to continue your OTC medications. Take the steroid and cough medication as directed.  Follow-up in person for any non-resolving, new or worsening symptoms despite treatment.    If you  have been instructed to have an in-person evaluation today at a local Urgent Care facility, please use the link below. It will take you to a list of all of our available [facility]gent [facility], including address, phone number and hours of operation. Please do not delay care.  [facility]  If you or a family member do not have a primary care provider, use the link below to schedule a visit and establish care. When you choose a [facility] primary care physician or advanced practice provider, you gain a long-term partner in health. Find a Primary Care Provider  Learn more about [facility]'s in-office and virtual care options:  - Get Care Now

## 2023-09-29 ENCOUNTER — Telehealth: Admitting: Physician Assistant

## 2023-09-29 DIAGNOSIS — J069 Acute upper respiratory infection, unspecified: Secondary | ICD-10-CM

## 2023-09-29 NOTE — Progress Notes (Signed)
  Because of continued symptoms with need for further absence from work, which requires an in-person examination, I feel your condition warrants further evaluation and I recommend that you be seen in a face-to-face visit.   NOTE: There will be NO CHARGE for this E-Visit   If you are having a true medical emergency, please call 911.     For an urgent face to face visit, [facility] has multiple urgent care centers for your convenience.  Click the link below for the full list of locations and hours, walk-in wait times, appointment scheduling options and driving directions:  Urgent Care - [place], [place], [place], [place], [place], [place]  [place]     Your MyChart E-visit questionnaire answers were reviewed by a board certified advanced clinical practitioner to complete your personal care plan based on your specific symptoms.    Thank you for using e-Visits.

## 2023-09-30 ENCOUNTER — Ambulatory Visit (HOSPITAL_COMMUNITY)

## 2023-09-30 ENCOUNTER — Ambulatory Visit

## 2023-10-07 ENCOUNTER — Encounter: Admitting: Nurse Practitioner

## 2023-10-07 NOTE — Progress Notes (Signed)
 This encounter was created in error - please disregard.

## 2023-10-13 ENCOUNTER — Encounter (HOSPITAL_COMMUNITY): Payer: Self-pay

## 2023-10-13 ENCOUNTER — Ambulatory Visit (INDEPENDENT_AMBULATORY_CARE_PROVIDER_SITE_OTHER): Payer: Self-pay | Admitting: Licensed Clinical Social Worker

## 2023-10-13 DIAGNOSIS — Z91199 Patient's noncompliance with other medical treatment and regimen due to unspecified reason: Secondary | ICD-10-CM

## 2023-10-13 NOTE — Progress Notes (Signed)
 THERAPIST PROGRESS NOTE   Session Date: 10/13/2023  Session Time: 0800  Patient no-showed today's appointment; appointment was for initial CCA to establish tx.   Leisa Lenz, MSW, LCSW 10/13/2023,  8:18 AM

## 2023-10-26 ENCOUNTER — Other Ambulatory Visit: Payer: Self-pay | Admitting: Nurse Practitioner

## 2023-10-26 DIAGNOSIS — G2581 Restless legs syndrome: Secondary | ICD-10-CM

## 2023-10-27 ENCOUNTER — Other Ambulatory Visit: Payer: Self-pay | Admitting: Nurse Practitioner

## 2023-10-27 DIAGNOSIS — F339 Major depressive disorder, recurrent, unspecified: Secondary | ICD-10-CM

## 2023-11-02 ENCOUNTER — Other Ambulatory Visit: Payer: Self-pay

## 2023-11-02 ENCOUNTER — Other Ambulatory Visit: Payer: Self-pay | Admitting: Physician Assistant

## 2023-11-02 DIAGNOSIS — J4520 Mild intermittent asthma, uncomplicated: Secondary | ICD-10-CM

## 2023-11-02 MED ORDER — ALBUTEROL SULFATE HFA 108 (90 BASE) MCG/ACT IN AERS
2.0000 | INHALATION_SPRAY | Freq: Four times a day (QID) | RESPIRATORY_TRACT | 0 refills | Status: DC | PRN
  Filled 2023-11-02: qty 6.7, 25d supply, fill #0

## 2023-11-03 ENCOUNTER — Other Ambulatory Visit: Payer: Self-pay

## 2023-11-04 ENCOUNTER — Other Ambulatory Visit: Payer: Self-pay

## 2023-11-04 DIAGNOSIS — J4520 Mild intermittent asthma, uncomplicated: Secondary | ICD-10-CM

## 2023-11-04 MED ORDER — ALBUTEROL SULFATE HFA 108 (90 BASE) MCG/ACT IN AERS: 2.0000 | INHALATION_SPRAY | Freq: Four times a day (QID) | RESPIRATORY_TRACT | 1 refills | Status: AC | PRN

## 2023-11-24 ENCOUNTER — Telehealth: Admitting: Physician Assistant

## 2023-11-24 DIAGNOSIS — G43809 Other migraine, not intractable, without status migrainosus: Secondary | ICD-10-CM

## 2023-11-24 DIAGNOSIS — G43909 Migraine, unspecified, not intractable, without status migrainosus: Secondary | ICD-10-CM

## 2023-11-24 MED ORDER — PREDNISONE 10 MG (21) PO TBPK: ORAL_TABLET | ORAL | 0 refills | Status: AC

## 2023-11-24 MED ORDER — ONDANSETRON 4 MG PO TBDP
4.0000 mg | ORAL_TABLET | Freq: Three times a day (TID) | ORAL | 0 refills | Status: AC | PRN
Start: 2023-11-24 — End: ?

## 2023-11-24 NOTE — Patient Instructions (Signed)
 Kristi [name], thank you for joining Kristi Maillard, PA-C for today's virtual visit.  While this provider is not your primary care provider (PCP), if your PCP is located in our provider database this encounter information will be shared with them immediately following your visit.   A [facility] MyChart account gives you access to today's visit and all your visits, tests, and labs performed at [facility] " click here if you don't have a [facility] MyChart account or go to [email or web address]  Consent: (Patient) Kristi [name] provided verbal consent for this virtual visit at the beginning of the encounter.  Current Medications:  Current Outpatient Medications:    acetaminophen  (TYLENOL ) 650 MG CR tablet, Take 650 mg by mouth every 6 (six) hours as needed for pain., Disp: , Rfl:    albuterol  (VENTOLIN  HFA) 108 (90 Base) MCG/ACT inhaler, Inhale 2 puffs into the lungs every 6 (six) hours as needed for wheezing or shortness of breath., Disp: 6.7 g, Rfl: 1   amLODipine  (NORVASC ) 10 MG tablet, TAKE 1 TABLET EVERY DAY, Disp: 90 tablet, Rfl: 1   aspirin  EC 81 MG tablet, Take 1 tablet (81 mg total) by mouth daily. Swallow whole., Disp: 90 tablet, Rfl: 3   atorvastatin  (LIPITOR) 40 MG tablet, Take 1 tablet (40 mg total) by mouth daily., Disp: 90 tablet, Rfl: 1   benzonatate  (TESSALON ) 100 MG capsule, Take 1 capsule (100 mg total) by mouth 3 (three) times daily as needed for cough., Disp: 30 capsule, Rfl: 0   Blood Glucose Monitoring Suppl (ONETOUCH VERIO) w/Device KIT, Use to test blood sugar twice daily. Dx: E11.69, Disp: 1 kit, Rfl: 0   buPROPion  (WELLBUTRIN  XL) 150 MG 24 hr tablet, TAKE 1 TABLET EVERY DAY, Disp: 60 tablet, Rfl: 5   diclofenac  (VOLTAREN ) 75 MG EC tablet, Take 1 tablet (75 mg total) by mouth 2 (two) times daily., Disp: 180 tablet, Rfl: 3   fluticasone  (FLONASE ) 50 MCG/ACT nasal spray, PLACE 2 SPRAYS INTO BOTH NOSTRILS DAILY., Disp: 16 g, Rfl: 11   gabapentin   (NEURONTIN ) 100 MG capsule, TAKE 1 CAPSULE AT BEDTIME, Disp: 90 capsule, Rfl: 3   glucose blood (ONETOUCH VERIO) test strip, Use to test blood sugar twice daily. Dx: E11.69, Disp: 200 each, Rfl: 3   Lancets (ONETOUCH ULTRASOFT) lancets, Use to test blood sugar twice daily. Dx: E11.69, Disp: 200 each, Rfl: 3   Melatonin 10 MG TABS, Take 10 mg by mouth at bedtime as needed (Sleep)., Disp: , Rfl:    metFORMIN  (GLUCOPHAGE ) 500 MG tablet, TAKE 1 TABLET EVERY DAY WITH BREAKFAST, Disp: 90 tablet, Rfl: 3   Multiple Vitamin (MULTIVITAMIN) tablet, Take 1 tablet by mouth daily., Disp: , Rfl:    pantoprazole  (PROTONIX ) 40 MG tablet, TAKE 1 TABLET EVERY MORNING, Disp: 90 tablet, Rfl: 3   predniSONE  (DELTASONE ) 20 MG tablet, Take 2 tablets (40 mg total) by mouth daily with breakfast., Disp: 10 tablet, Rfl: 0   sodium chloride  (OCEAN) 0.65 % SOLN nasal spray, Place 1 spray into both nostrils daily as needed for congestion., Disp: , Rfl:    SUMAtriptan  (IMITREX ) 25 MG tablet, TAKE 1 TABLET AS NEEDED FOR HEADACHE. MAY REPEAT IN 2 HOURS IF HEADACHE PERSISTS OR RECURS. MAX 3 TABS/24 HOURS, Disp: 9 tablet, Rfl: 11   venlafaxine  XR (EFFEXOR -XR) 75 MG 24 hr capsule, TAKE 1 CAPSULE EVERY DAY WITH BREAKFAST. NEED TO SCHEDULE APPOINTMENT BEFORE ANY ADDITIONAL REFILLS, Disp: 90 capsule, Rfl: 1   Vitamin D , Ergocalciferol , (DRISDOL ) 1.25 MG (50000  UNIT) CAPS capsule, TAKE 1 CAPSULE ONCE A WEEK., Disp: 12 capsule, Rfl: 3   Medications ordered in this encounter:  No orders of the defined types were placed in this encounter.    *If you need refills on other medications prior to your next appointment, please contact your pharmacy*  Follow-Up: Call back or seek an in-person evaluation if the symptoms worsen or if the condition fails to improve as anticipated.  [facility] [phone]  Other Instructions Stay hydrated and rest. Take there steroid pack as directed. I have also sent in Zofran  to calm  nausea. If symptoms are not resolving, or you note new/worsening symptoms despite treatment, please seek an in-person evaluation ASAP.   If you have been instructed to have an in-person evaluation today at a local Urgent Care facility, please use the link below. It will take you to a list of all of our available [facility], including address, phone number and hours of operation. Please do not delay care.  [facility]  If you or a family member do not have a primary care provider, use the link below to schedule a visit and establish care. When you choose a [facility] primary care physician or advanced practice provider, you gain a long-term partner in health. Find a Primary Care Provider  Learn more about [facility] in-office and virtual care options:  - Get Care Now

## 2023-11-24 NOTE — Progress Notes (Signed)
   Thank you for the details you included in the comment boxes. Those details are very helpful in determining the best course of treatment for you and help us  to provide the best care.Because we cannot treat migraine headaches via e-visit, we recommend that you schedule a Virtual Urgent Care video visit in order for the provider to better assess what is going on.  The provider will be able to give you a more accurate diagnosis and treatment plan if we can more freely discuss your symptoms and with the addition of a virtual examination.   If you change your visit to a video visit, we will bill your insurance (similar to an office visit) and you will not be charged for this e-Visit. You will be able to stay at home and speak with the first available [facility] advanced practice provider. The link to do a video visit is in the drop down Menu tab of your Welcome screen in MyChart.

## 2023-11-24 NOTE — Progress Notes (Signed)
 Virtual Visit Consent   Kristi [name], you are scheduled for a virtual visit with a [facility] provider today. Just as with appointments in the office, your consent must be obtained to participate. Your consent will be active for this visit and any virtual visit you may have with one of our providers in the next 365 days. If you have a MyChart account, a copy of this consent can be sent to you electronically.  As this is a virtual visit, video technology does not allow for your provider to perform a traditional examination. This may limit your provider's ability to fully assess your condition. If your provider identifies any concerns that need to be evaluated in person or the need to arrange testing (such as labs, EKG, etc.), we will make arrangements to do so. Although advances in technology are sophisticated, we cannot ensure that it will always work on either your end or our end. If the connection with a video visit is poor, the visit may have to be switched to a telephone visit. With either a video or telephone visit, we are not always able to ensure that we have a secure connection.  By engaging in this virtual visit, you consent to the provision of healthcare and authorize for your insurance to be billed (if applicable) for the services provided during this visit. Depending on your insurance coverage, you may receive a charge related to this service.  I need to obtain your verbal consent now. Are you willing to proceed with your visit today? Kristi [name] has provided verbal consent on 11/24/2023 for a virtual visit (video or telephone). Kristi [name], [place]  Date: 11/24/2023 5:45 PM   Virtual Visit via Video Note   I, Kristi [name], connected with  Kristi [name]  ([id number], [birth year]) on 11/24/23 at  5:45 PM EDT by a video-enabled telemedicine application and verified that I am speaking with the correct person using two identifiers.  Location: Patient: Virtual Visit  Location Patient: Home Provider: Virtual Visit Location Provider: Home Office   I discussed the limitations of evaluation and management by telemedicine and the availability of in person appointments. The patient expressed understanding and agreed to proceed.    History of Present Illness: Kristi [name] is a 68 y.o. who identifies as a female who was assigned female at birth, and is being seen today for migraine headache starting slightly a couple of days ago and then acutely worsening this morning. Headache is unilateral and R-sided with some light sensitivity. Is also noting some sinus pressure (frontal) bilaterally. Notes nausea without vomiting. Denies fever, chills. Is hydrating well.   Sumatriptan  x 3 -- max per her PCP.  Fluticasone   HPI: HPI  Problems:  Patient Active Problem List   Diagnosis Date Noted   Migraine with aura and without status migrainosus, not intractable 10/05/2021   History of stomach ulcers 09/16/2021   Morbid obesity (HCC) 05/05/2021   Dyspnea 05/05/2021   Chest pain 02/24/2017   Atypical chest pain 05/10/2016   HTN (hypertension) 05/10/2016   Hypertensive emergency 05/10/2016   Anxiety 05/10/2016   Gastroesophageal reflux disease with esophagitis     Allergies:  Allergies  Allergen Reactions   Fish Allergy Anaphylaxis   Iodine Anaphylaxis   Other     Seafood altogether    Sulfa Antibiotics Hives   Medications:  Current Outpatient Medications:    ondansetron  (ZOFRAN -ODT) 4 MG disintegrating tablet, Take 1 tablet (4 mg total) by mouth every 8 (eight) hours as needed for  nausea or vomiting., Disp: 20 tablet, Rfl: 0   predniSONE  (STERAPRED UNI-PAK 21 TAB) 10 MG (21) TBPK tablet, Take following package directions, Disp: 21 tablet, Rfl: 0   acetaminophen  (TYLENOL ) 650 MG CR tablet, Take 650 mg by mouth every 6 (six) hours as needed for pain., Disp: , Rfl:    albuterol  (VENTOLIN  HFA) 108 (90 Base) MCG/ACT inhaler, Inhale 2 puffs into the lungs every 6  (six) hours as needed for wheezing or shortness of breath., Disp: 6.7 g, Rfl: 1   amLODipine  (NORVASC ) 10 MG tablet, TAKE 1 TABLET EVERY DAY, Disp: 90 tablet, Rfl: 1   aspirin  EC 81 MG tablet, Take 1 tablet (81 mg total) by mouth daily. Swallow whole., Disp: 90 tablet, Rfl: 3   atorvastatin  (LIPITOR) 40 MG tablet, Take 1 tablet (40 mg total) by mouth daily., Disp: 90 tablet, Rfl: 1   benzonatate  (TESSALON ) 100 MG capsule, Take 1 capsule (100 mg total) by mouth 3 (three) times daily as needed for cough., Disp: 30 capsule, Rfl: 0   Blood Glucose Monitoring Suppl (ONETOUCH VERIO) w/Device KIT, Use to test blood sugar twice daily. Dx: E11.69, Disp: 1 kit, Rfl: 0   buPROPion  (WELLBUTRIN  XL) 150 MG 24 hr tablet, TAKE 1 TABLET EVERY DAY, Disp: 60 tablet, Rfl: 5   diclofenac  (VOLTAREN ) 75 MG EC tablet, Take 1 tablet (75 mg total) by mouth 2 (two) times daily., Disp: 180 tablet, Rfl: 3   fluticasone  (FLONASE ) 50 MCG/ACT nasal spray, PLACE 2 SPRAYS INTO BOTH NOSTRILS DAILY., Disp: 16 g, Rfl: 11   gabapentin  (NEURONTIN ) 100 MG capsule, TAKE 1 CAPSULE AT BEDTIME, Disp: 90 capsule, Rfl: 3   glucose blood (ONETOUCH VERIO) test strip, Use to test blood sugar twice daily. Dx: E11.69, Disp: 200 each, Rfl: 3   Lancets (ONETOUCH ULTRASOFT) lancets, Use to test blood sugar twice daily. Dx: E11.69, Disp: 200 each, Rfl: 3   Melatonin 10 MG TABS, Take 10 mg by mouth at bedtime as needed (Sleep)., Disp: , Rfl:    metFORMIN  (GLUCOPHAGE ) 500 MG tablet, TAKE 1 TABLET EVERY DAY WITH BREAKFAST, Disp: 90 tablet, Rfl: 3   Multiple Vitamin (MULTIVITAMIN) tablet, Take 1 tablet by mouth daily., Disp: , Rfl:    pantoprazole  (PROTONIX ) 40 MG tablet, TAKE 1 TABLET EVERY MORNING, Disp: 90 tablet, Rfl: 3   sodium chloride  (OCEAN) 0.65 % SOLN nasal spray, Place 1 spray into both nostrils daily as needed for congestion., Disp: , Rfl:    SUMAtriptan  (IMITREX ) 25 MG tablet, TAKE 1 TABLET AS NEEDED FOR HEADACHE. MAY REPEAT IN 2 HOURS IF  HEADACHE PERSISTS OR RECURS. MAX 3 TABS/24 HOURS, Disp: 9 tablet, Rfl: 11   venlafaxine  XR (EFFEXOR -XR) 75 MG 24 hr capsule, TAKE 1 CAPSULE EVERY DAY WITH BREAKFAST. NEED TO SCHEDULE APPOINTMENT BEFORE ANY ADDITIONAL REFILLS, Disp: 90 capsule, Rfl: 1   Vitamin D , Ergocalciferol , (DRISDOL ) 1.25 MG (50000 UNIT) CAPS capsule, TAKE 1 CAPSULE ONCE A WEEK., Disp: 12 capsule, Rfl: 3  Observations/Objective: Patient is well-developed, well-nourished in no acute distress.  Resting comfortably at home.  Head is normocephalic, atraumatic.  No labored breathing.  Speech is clear and coherent with logical content.  Patient is alert and oriented at baseline.   Assessment and Plan: 1. Acute migraine (Primary) - ondansetron  (ZOFRAN -ODT) 4 MG disintegrating tablet; Take 1 tablet (4 mg total) by mouth every 8 (eight) hours as needed for nausea or vomiting.  Dispense: 20 tablet; Refill: 0 - predniSONE  (STERAPRED UNI-PAK 21 TAB) 10 MG (21) TBPK  tablet; Take following package directions  Dispense: 21 tablet; Refill: 0  Afebrile. No alarm signs/symptoms. Supportive measures and OTC medications reviewed. Maxed her Triptan. Will start Prednisone  pack for abortive therapy. Zofran  per orders. Follow-up in person if not resolving or any new/worsening symptoms despite treatment.  Follow Up Instructions: I discussed the assessment and treatment plan with the patient. The patient was provided an opportunity to ask questions and all were answered. The patient agreed with the plan and demonstrated an understanding of the instructions.  A copy of instructions were sent to the patient via MyChart unless otherwise noted below.   The patient was advised to call back or seek an in-person evaluation if the symptoms worsen or if the condition fails to improve as anticipated.    Kristi Maillard, PA-C

## 2023-12-05 ENCOUNTER — Ambulatory Visit: Payer: Medicare HMO | Admitting: Nurse Practitioner

## 2023-12-12 ENCOUNTER — Ambulatory Visit: Admitting: Nurse Practitioner

## 2023-12-19 ENCOUNTER — Other Ambulatory Visit: Payer: Self-pay | Admitting: Nurse Practitioner

## 2023-12-20 ENCOUNTER — Ambulatory Visit: Admitting: Family

## 2023-12-21 ENCOUNTER — Encounter (HOSPITAL_COMMUNITY): Payer: Self-pay

## 2023-12-21 ENCOUNTER — Telehealth (HOSPITAL_BASED_OUTPATIENT_CLINIC_OR_DEPARTMENT_OTHER): Payer: Self-pay | Admitting: Psychiatry

## 2023-12-21 DIAGNOSIS — Z91199 Patient's noncompliance with other medical treatment and regimen due to unspecified reason: Secondary | ICD-10-CM

## 2023-12-21 NOTE — Progress Notes (Signed)
 Patient is no-show on video platform

## 2023-12-28 ENCOUNTER — Other Ambulatory Visit: Payer: Self-pay | Admitting: Nurse Practitioner

## 2023-12-28 DIAGNOSIS — E782 Mixed hyperlipidemia: Secondary | ICD-10-CM

## 2024-03-28 DIAGNOSIS — F419 Anxiety disorder, unspecified: Secondary | ICD-10-CM | POA: Diagnosis not present

## 2024-03-28 DIAGNOSIS — F1210 Cannabis abuse, uncomplicated: Secondary | ICD-10-CM | POA: Diagnosis not present

## 2024-03-28 DIAGNOSIS — E1142 Type 2 diabetes mellitus with diabetic polyneuropathy: Secondary | ICD-10-CM | POA: Diagnosis not present

## 2024-03-28 DIAGNOSIS — R0602 Shortness of breath: Secondary | ICD-10-CM | POA: Diagnosis not present

## 2024-03-28 DIAGNOSIS — F339 Major depressive disorder, recurrent, unspecified: Secondary | ICD-10-CM | POA: Diagnosis not present

## 2024-03-28 DIAGNOSIS — Z23 Encounter for immunization: Secondary | ICD-10-CM | POA: Diagnosis not present

## 2024-03-28 DIAGNOSIS — E785 Hyperlipidemia, unspecified: Secondary | ICD-10-CM | POA: Diagnosis not present

## 2024-03-28 DIAGNOSIS — I10 Essential (primary) hypertension: Secondary | ICD-10-CM | POA: Diagnosis not present

## 2024-05-08 DIAGNOSIS — Z1159 Encounter for screening for other viral diseases: Secondary | ICD-10-CM | POA: Diagnosis not present

## 2024-05-13 ENCOUNTER — Other Ambulatory Visit: Payer: Self-pay | Admitting: Nurse Practitioner

## 2024-05-21 ENCOUNTER — Encounter: Payer: Medicare HMO | Admitting: Family

## 2024-05-21 NOTE — Progress Notes (Signed)
   This encounter was created in error - please disregard. No show

## 2024-07-04 ENCOUNTER — Other Ambulatory Visit: Payer: Self-pay | Admitting: Nurse Practitioner

## 2024-07-04 DIAGNOSIS — M150 Primary generalized (osteo)arthritis: Secondary | ICD-10-CM

## 2024-07-11 ENCOUNTER — Other Ambulatory Visit: Payer: Self-pay | Admitting: Nurse Practitioner

## 2024-07-28 ENCOUNTER — Other Ambulatory Visit: Payer: Self-pay | Admitting: Nurse Practitioner

## 2024-07-28 DIAGNOSIS — M150 Primary generalized (osteo)arthritis: Secondary | ICD-10-CM
